# Patient Record
Sex: Female | Born: 1935 | Race: White | Hispanic: No | Smoking: Never smoker
Health system: Southern US, Community
[De-identification: ages and names within clinical notes are randomized; demographics above are authoritative.]

## PROBLEM LIST (undated history)

## (undated) DIAGNOSIS — F419 Anxiety disorder, unspecified: Secondary | ICD-10-CM

## (undated) DIAGNOSIS — G2581 Restless legs syndrome: Secondary | ICD-10-CM

## (undated) DIAGNOSIS — Z23 Encounter for immunization: Secondary | ICD-10-CM

## (undated) DIAGNOSIS — Z1211 Encounter for screening for malignant neoplasm of colon: Secondary | ICD-10-CM

## (undated) DIAGNOSIS — J189 Pneumonia, unspecified organism: Secondary | ICD-10-CM

## (undated) DIAGNOSIS — Z1331 Encounter for screening for depression: Secondary | ICD-10-CM

## (undated) DIAGNOSIS — Z79899 Other long term (current) drug therapy: Secondary | ICD-10-CM

## (undated) DIAGNOSIS — M25519 Pain in unspecified shoulder: Secondary | ICD-10-CM

## (undated) DIAGNOSIS — E871 Hypo-osmolality and hyponatremia: Secondary | ICD-10-CM

## (undated) DIAGNOSIS — N189 Chronic kidney disease, unspecified: Secondary | ICD-10-CM

## (undated) DIAGNOSIS — IMO0001 Reserved for inherently not codable concepts without codable children: Secondary | ICD-10-CM

## (undated) DIAGNOSIS — I219 Acute myocardial infarction, unspecified: Secondary | ICD-10-CM

## (undated) DIAGNOSIS — E1143 Type 2 diabetes mellitus with diabetic autonomic (poly)neuropathy: Secondary | ICD-10-CM

## (undated) DIAGNOSIS — H9190 Unspecified hearing loss, unspecified ear: Secondary | ICD-10-CM

## (undated) DIAGNOSIS — M25569 Pain in unspecified knee: Secondary | ICD-10-CM

## (undated) DIAGNOSIS — I4891 Unspecified atrial fibrillation: Secondary | ICD-10-CM

## (undated) DIAGNOSIS — F329 Major depressive disorder, single episode, unspecified: Secondary | ICD-10-CM

## (undated) DIAGNOSIS — Z1231 Encounter for screening mammogram for malignant neoplasm of breast: Secondary | ICD-10-CM

## (undated) DIAGNOSIS — K209 Esophagitis, unspecified without bleeding: Secondary | ICD-10-CM

## (undated) DIAGNOSIS — N390 Urinary tract infection, site not specified: Secondary | ICD-10-CM

## (undated) DIAGNOSIS — M810 Age-related osteoporosis without current pathological fracture: Secondary | ICD-10-CM

## (undated) DIAGNOSIS — L209 Atopic dermatitis, unspecified: Secondary | ICD-10-CM

## (undated) DIAGNOSIS — M531 Cervicobrachial syndrome: Secondary | ICD-10-CM

## (undated) DIAGNOSIS — E785 Hyperlipidemia, unspecified: Secondary | ICD-10-CM

## (undated) DIAGNOSIS — E119 Type 2 diabetes mellitus without complications: Secondary | ICD-10-CM

## (undated) DIAGNOSIS — F32A Depression, unspecified: Secondary | ICD-10-CM

## (undated) DIAGNOSIS — I10 Essential (primary) hypertension: Secondary | ICD-10-CM

## (undated) DIAGNOSIS — G909 Disorder of the autonomic nervous system, unspecified: Secondary | ICD-10-CM

## (undated) DIAGNOSIS — R519 Headache, unspecified: Secondary | ICD-10-CM

## (undated) DIAGNOSIS — K219 Gastro-esophageal reflux disease without esophagitis: Secondary | ICD-10-CM

## (undated) DIAGNOSIS — J309 Allergic rhinitis, unspecified: Secondary | ICD-10-CM

## (undated) DIAGNOSIS — J45909 Unspecified asthma, uncomplicated: Secondary | ICD-10-CM

## (undated) DIAGNOSIS — I8310 Varicose veins of unspecified lower extremity with inflammation: Secondary | ICD-10-CM

## (undated) DIAGNOSIS — M1990 Unspecified osteoarthritis, unspecified site: Secondary | ICD-10-CM

## (undated) DIAGNOSIS — R001 Bradycardia, unspecified: Secondary | ICD-10-CM

## (undated) DIAGNOSIS — E559 Vitamin D deficiency, unspecified: Secondary | ICD-10-CM

## (undated) DIAGNOSIS — R609 Edema, unspecified: Secondary | ICD-10-CM

## (undated) DIAGNOSIS — R51 Headache: Secondary | ICD-10-CM

## (undated) HISTORY — DX: Edema, unspecified: R60.9

## (undated) HISTORY — DX: Type 2 diabetes mellitus without complications: E11.9

## (undated) HISTORY — DX: Atopic dermatitis, unspecified: L20.9

## (undated) HISTORY — DX: Restless legs syndrome: G25.81

## (undated) HISTORY — DX: Encounter for screening for depression: Z13.31

## (undated) HISTORY — DX: Unspecified hearing loss, unspecified ear: H91.90

## (undated) HISTORY — DX: Esophagitis, unspecified: K20.9

## (undated) HISTORY — PX: ABDOMINAL HYSTERECTOMY: SHX81

## (undated) HISTORY — DX: Esophagitis, unspecified without bleeding: K20.90

## (undated) HISTORY — DX: Encounter for immunization: Z23

## (undated) HISTORY — DX: Urinary tract infection, site not specified: N39.0

## (undated) HISTORY — DX: Encounter for screening mammogram for malignant neoplasm of breast: Z12.31

## (undated) HISTORY — DX: Anxiety disorder, unspecified: F41.9

## (undated) HISTORY — DX: Unspecified atrial fibrillation: I48.91

## (undated) HISTORY — DX: Age-related osteoporosis without current pathological fracture: M81.0

## (undated) HISTORY — DX: Vitamin D deficiency, unspecified: E55.9

## (undated) HISTORY — DX: Hypo-osmolality and hyponatremia: E87.1

## (undated) HISTORY — DX: Disorder of the autonomic nervous system, unspecified: G90.9

## (undated) HISTORY — DX: Hyperlipidemia, unspecified: E78.5

## (undated) HISTORY — DX: Essential (primary) hypertension: I10

## (undated) HISTORY — DX: Cervicobrachial syndrome: M53.1

## (undated) HISTORY — DX: Allergic rhinitis, unspecified: J30.9

## (undated) HISTORY — DX: Hypercalcemia: E83.52

## (undated) HISTORY — DX: Headache, unspecified: R51.9

## (undated) HISTORY — DX: Unspecified asthma, uncomplicated: J45.909

## (undated) HISTORY — PX: TONSILLECTOMY: SUR1361

## (undated) HISTORY — PX: SHOULDER SURGERY: SHX246

## (undated) HISTORY — DX: Other long term (current) drug therapy: Z79.899

## (undated) HISTORY — PX: CHOLECYSTECTOMY: SHX55

## (undated) HISTORY — PX: APPENDECTOMY: SHX54

## (undated) HISTORY — DX: Pain in unspecified shoulder: M25.519

## (undated) HISTORY — DX: Type 2 diabetes mellitus with diabetic autonomic (poly)neuropathy: E11.43

## (undated) HISTORY — PX: OTHER SURGICAL HISTORY: SHX169

## (undated) HISTORY — DX: Varicose veins of unspecified lower extremity with inflammation: I83.10

## (undated) HISTORY — DX: Bradycardia, unspecified: R00.1

## (undated) HISTORY — DX: Encounter for screening for malignant neoplasm of colon: Z12.11

## (undated) HISTORY — DX: Pain in unspecified knee: M25.569

## (undated) HISTORY — DX: Headache: R51

## (undated) HISTORY — DX: Reserved for inherently not codable concepts without codable children: IMO0001

## (undated) HISTORY — DX: Disorders of magnesium metabolism, unspecified: E83.40

---

## 1995-04-04 DIAGNOSIS — I10 Essential (primary) hypertension: Secondary | ICD-10-CM

## 1995-04-04 DIAGNOSIS — E785 Hyperlipidemia, unspecified: Secondary | ICD-10-CM

## 1995-04-04 HISTORY — DX: Essential (primary) hypertension: I10

## 1995-04-04 HISTORY — DX: Hyperlipidemia, unspecified: E78.5

## 1998-02-01 ENCOUNTER — Ambulatory Visit (HOSPITAL_COMMUNITY): Admission: RE | Admit: 1998-02-01 | Discharge: 1998-02-01 | Payer: Self-pay

## 1998-02-04 ENCOUNTER — Ambulatory Visit (HOSPITAL_COMMUNITY): Admission: RE | Admit: 1998-02-04 | Discharge: 1998-02-04 | Payer: Self-pay

## 1999-02-09 ENCOUNTER — Ambulatory Visit (HOSPITAL_COMMUNITY): Admission: RE | Admit: 1999-02-09 | Discharge: 1999-02-09 | Payer: Self-pay | Admitting: Family Medicine

## 2000-02-16 ENCOUNTER — Ambulatory Visit (HOSPITAL_COMMUNITY): Admission: RE | Admit: 2000-02-16 | Discharge: 2000-02-16 | Payer: Self-pay | Admitting: Family Medicine

## 2000-02-16 ENCOUNTER — Encounter: Payer: Self-pay | Admitting: Family Medicine

## 2000-07-19 ENCOUNTER — Encounter: Payer: Self-pay | Admitting: Family Medicine

## 2000-07-19 ENCOUNTER — Encounter: Admission: RE | Admit: 2000-07-19 | Discharge: 2000-07-19 | Payer: Self-pay | Admitting: Family Medicine

## 2001-01-16 ENCOUNTER — Encounter: Admission: RE | Admit: 2001-01-16 | Discharge: 2001-01-16 | Payer: Self-pay | Admitting: Family Medicine

## 2001-01-16 ENCOUNTER — Encounter: Payer: Self-pay | Admitting: Family Medicine

## 2002-04-03 DIAGNOSIS — E119 Type 2 diabetes mellitus without complications: Secondary | ICD-10-CM

## 2002-04-03 HISTORY — DX: Type 2 diabetes mellitus without complications: E11.9

## 2013-05-19 ENCOUNTER — Encounter: Payer: Self-pay | Admitting: *Deleted

## 2013-05-21 ENCOUNTER — Institutional Professional Consult (permissible substitution): Payer: Self-pay | Admitting: Internal Medicine

## 2013-06-27 ENCOUNTER — Encounter: Payer: Self-pay | Admitting: Internal Medicine

## 2013-06-27 ENCOUNTER — Ambulatory Visit (INDEPENDENT_AMBULATORY_CARE_PROVIDER_SITE_OTHER): Payer: Medicare Other | Admitting: Internal Medicine

## 2013-06-27 VITALS — BP 198/75 | HR 71 | Wt 165.0 lb

## 2013-06-27 DIAGNOSIS — I4891 Unspecified atrial fibrillation: Secondary | ICD-10-CM

## 2013-06-27 DIAGNOSIS — I10 Essential (primary) hypertension: Secondary | ICD-10-CM

## 2013-06-27 DIAGNOSIS — I498 Other specified cardiac arrhythmias: Secondary | ICD-10-CM

## 2013-06-27 DIAGNOSIS — R001 Bradycardia, unspecified: Secondary | ICD-10-CM | POA: Insufficient documentation

## 2013-06-27 MED ORDER — LOSARTAN POTASSIUM-HCTZ 100-12.5 MG PO TABS: 1.0000 | ORAL_TABLET | Freq: Every day | ORAL | Status: DC

## 2013-06-27 MED ORDER — PROPRANOLOL HCL 10 MG PO TABS: 10.0000 mg | ORAL_TABLET | Freq: Four times a day (QID) | ORAL | Status: DC | PRN

## 2013-06-27 NOTE — Progress Notes (Signed)
Patient Care Team: Provider Default, MD as PCP - General   HPI  Kelly Boone is a 80 y.o. female Seen with recent diagnosis of atrial fibrillation for which she was started on Rivaroxaban coreg, losaran and Mag and aldactone   she had awakened for the second time in 2 years with tachycardia palpitations. The first episode had stopped shortly after arrival in the emergency room apparently.  The second, not having stopped prompted admission. She apparently underwent Myoview scanning and echocardiography which were unrevealing  reports were reviewed. Interestingly her left atrial dimension was normal.   She has hx of hypertenson, dabetes   she was negatively impressed by the cost of the anticoagulant and so she deferred initiating and 2 she came for this visit today.  She does not snore.  She has not had problems with exercise intolerance.  Her biggest limitation is seen in her legs.    Past Medical History  Diagnosis Date  . A-fib     irregular heartbeat  . DM (diabetes mellitus) 2004  . Hypertension 1997  . Hyperlipemia 1997  . Allergic rhinitis   . Anxiety   . Asthma   . Atopic dermatitis   . Benign essential hypertension   . Edema   . Special screening for malignant neoplasms, colon   . Esophagitis   . Headache   . Hearing loss   . Encounter for long-term (current) use of other medications   . Hypercalcemia   . Other and unspecified hyperlipidemia   . Disorders of magnesium metabolism   . Hyposmolality and/or hyponatremia   . Pain in joint, lower leg   . Myalgia and myositis, unspecified   . Need for prophylactic vaccination and inoculation against influenza   . Other syndromes affecting cervical region   . Osteoporosis, unspecified   . Pain in joint, multiple sites   . Pain in joint, shoulder region   . Palpitations   . Personal history of noncompliance with medical treatment, presenting hazards to health   . Restless legs syndrome (RLS)   . Unspecified  tinnitus   . Screening for depression   . Sleep related leg cramps   . Varicose veins of lower extremities with inflammation   . Unspecified essential hypertension   . Peripheral autonomic neuropathy in disorders classified elsewhere(337.1)   . Type 2 diabetes mellitus with autonomic neuropathy   . Urinary tract infection, site not specified   . Other screening mammogram   . Unspecified vitamin D deficiency     Past Surgical History  Procedure Laterality Date  . Appendectomy    . Cholecystectomy    . Abdominal hysterectomy    . Shoulder surgery      Repair  . Tonsillectomy    . Other surgical history      Nuclear Stress Test with EF 70%, Chest Radiograph-lungs are clear without pleural effusions or focal consolidations. Trachea projects midline and there is no pneumothorax. Suture anchors in right humeral head.    Current Outpatient Prescriptions  Medication Sig Dispense Refill  . alendronate (FOSAMAX) 70 MG tablet Take 70 mg by mouth once a week. Take with a full glass of water on an empty stomach.      Marland Kitchen aspirin 81 MG tablet Take 81 mg by mouth daily.      Marland Kitchen BIOTIN PO Take by mouth daily.      . Calcium Carbonate (CALCIUM 600 PO) Take 600 mg by mouth daily.      Marland Kitchen  carvedilol (COREG) 3.125 MG tablet Take 3.125 mg by mouth 2 (two) times daily with a meal.      . Cholecalciferol (VITAMIN D PO) Take 1,000 Units by mouth daily.      . diazepam (VALIUM) 5 MG tablet Take 5 mg by mouth every 6 (six) hours as needed for anxiety.      Marland Kitchen GRAPE SEED EXTRACT PO Take by mouth daily.      Marland Kitchen losartan (COZAAR) 100 MG tablet Take 100 mg by mouth daily.      . Magnesium 250 MG TABS 2 tabs po qd      . NON FORMULARY Isotonix  1 teaspoon daily for  RLS      . rOPINIRole (REQUIP) 1 MG tablet Take 1 mg by mouth at bedtime.      Marland Kitchen spironolactone (ALDACTONE) 25 MG tablet Take 25 mg by mouth daily.      . vitamin B-12 (CYANOCOBALAMIN) 1000 MCG tablet Take 1,000 mcg by mouth 2 (two) times daily.         No current facility-administered medications for this visit.    Allergies  Allergen Reactions  . Daypro [Oxaprozin]     Palpitation  . Statins     Myalgias  . Zetia [Ezetimibe]     Joint Pain, Swelling    Review of Systems negative except from HPI and PMH  Physical Exam BP 198/75  Pulse 71  Wt 165 lb (74.844 kg) Alert and oriented in no acute distress HENT- normal Eyes- EOMI, without scleral icterus Skin- warm and dry; without rashes LN-neg Neck- supple without thyromegaly, JVP-flat, carotids brisk and full without bruits Back-without CVAT or kyphosis Lungs-clear to auscultation CV-Regular rate and rhythm, nl S1 and S2, no murmurs gallops or rubs, S4-absent Abd-soft with active bowel sounds; no midline pulsation or hepatomegaly Pulses-intact femoral and distal MKS-without gross deformity Neuro- Ax O, CN3-12 intact, grossly normal motor and sensory function Affect engaging Ext2+ edema    Assessment and  Plan  Atrial fibrillation  Sinus bradycardia  Hypertension  Diabetes   Infrequent paroxysms of atrial fibrillation are associated with a rapid ventricular rate. She also has resting sinus bradycardia. Hence, we will have difficulty using chronic medications to mitigate the rapid rates with her paroxysms of atrial fibrillation. Thankfully however her episodes have occurred only once every couple of years and so we will use when necessary beta blockers to be taken at the time of an event.  Her CHADS-  score is 3 and her CHADS-VASc score is 5. Hence, anticoagulation is indicated. We discussed the use of the NOACs compared to Coumadin. We briefly reviewed the data of at least comparability in stroke prevention, bleeding and outcome. We discussed some of the new once wherein somewhat associated with decreased ischemic stroke risk, one to be taken daily, and has been shown to be comparable and bleeding risk to aspirin.  We also discussed bleeding associated with  warfarin as well as NOACs and a wall bleeding as a complication of all these drugs intracranial bleeding is more frequently associated with warfarin then the NOACs and a GI bleeding is more commonly associated with the latter  As relates to her diabetes, her hemoglobin A1c was 5.8. She is getting a great deal of money on her new hypoglycemic agent I suggested she talk to Dr. Marcello Moores about resuming metformin.  Given her peripheral edema, we will switch her from we'll switch from losartan--losartan/HCT 100/12.5

## 2013-06-27 NOTE — Patient Instructions (Addendum)
Your physician has recommended you make the following change in your medication:  1) Take Propanolol 10 mg every 6 hours as needed 2) Stop Losartan 3) Start Hyzaar 100-12.5 mg daily  Eliquis 2.5 mg samples given to you today   Your physician wants you to follow-up in: 6 months with Dr. Caryl Comes. You will receive a reminder letter in the mail two months in advance. If you don't receive a letter, please call our office to schedule the follow-up appointment.

## 2013-08-13 ENCOUNTER — Telehealth: Payer: Self-pay | Admitting: Internal Medicine

## 2013-08-13 ENCOUNTER — Other Ambulatory Visit: Payer: Self-pay | Admitting: *Deleted

## 2013-08-13 MED ORDER — APIXABAN 5 MG PO TABS
5.0000 mg | ORAL_TABLET | Freq: Two times a day (BID) | ORAL | Status: DC
Start: 2013-08-13 — End: 2013-09-28

## 2013-08-13 NOTE — Telephone Encounter (Signed)
New message    Patient was told to call back this am speak with Dr. Caryl Comes nurse.

## 2013-08-13 NOTE — Telephone Encounter (Signed)
Called patient to discuss which anticoagulant she is taking - she states Eliquis. Rx sent to [facility]

## 2013-08-21 ENCOUNTER — Other Ambulatory Visit: Payer: Self-pay | Admitting: *Deleted

## 2013-08-21 ENCOUNTER — Telehealth: Payer: Self-pay | Admitting: *Deleted

## 2013-08-21 NOTE — Telephone Encounter (Signed)
Pt calls in complaining of low BP, dizzy, swimmy headed. BP as low as 89/60, avg low 100s. Originally started Hyzaar because of peripheral edema - which has resolved. Discussed with Alferd Apa, pharmacist - stopping Hyzaar, restarting Losartan 100 mg daily. Advised pt to call us if edema returns. She is agreeable to plan.

## 2013-09-08 ENCOUNTER — Other Ambulatory Visit: Payer: Self-pay | Admitting: *Deleted

## 2013-09-08 MED ORDER — PROPRANOLOL HCL 10 MG PO TABS: 10.0000 mg | ORAL_TABLET | Freq: Four times a day (QID) | ORAL | Status: DC | PRN

## 2013-09-28 ENCOUNTER — Encounter (HOSPITAL_COMMUNITY): Payer: Self-pay | Admitting: Emergency Medicine

## 2013-09-28 ENCOUNTER — Emergency Department (HOSPITAL_COMMUNITY): Payer: Medicare Other

## 2013-09-28 ENCOUNTER — Inpatient Hospital Stay (HOSPITAL_COMMUNITY)
Admission: EM | Admit: 2013-09-28 | Discharge: 2013-10-01 | DRG: 281 | Disposition: A | Discharge status: Home / Self Care | Payer: Medicare Other | Attending: Cardiovascular Disease | Admitting: Cardiovascular Disease

## 2013-09-28 DIAGNOSIS — I498 Other specified cardiac arrhythmias: Secondary | ICD-10-CM | POA: Diagnosis present

## 2013-09-28 DIAGNOSIS — I5181 Takotsubo syndrome: Principal | ICD-10-CM | POA: Diagnosis present

## 2013-09-28 DIAGNOSIS — E785 Hyperlipidemia, unspecified: Secondary | ICD-10-CM | POA: Diagnosis present

## 2013-09-28 DIAGNOSIS — I482 Chronic atrial fibrillation, unspecified: Secondary | ICD-10-CM

## 2013-09-28 DIAGNOSIS — M810 Age-related osteoporosis without current pathological fracture: Secondary | ICD-10-CM | POA: Diagnosis present

## 2013-09-28 DIAGNOSIS — E871 Hypo-osmolality and hyponatremia: Secondary | ICD-10-CM | POA: Diagnosis present

## 2013-09-28 DIAGNOSIS — G2581 Restless legs syndrome: Secondary | ICD-10-CM | POA: Diagnosis present

## 2013-09-28 DIAGNOSIS — Z7901 Long term (current) use of anticoagulants: Secondary | ICD-10-CM

## 2013-09-28 DIAGNOSIS — I480 Paroxysmal atrial fibrillation: Secondary | ICD-10-CM

## 2013-09-28 DIAGNOSIS — G909 Disorder of the autonomic nervous system, unspecified: Secondary | ICD-10-CM | POA: Diagnosis present

## 2013-09-28 DIAGNOSIS — F411 Generalized anxiety disorder: Secondary | ICD-10-CM | POA: Diagnosis present

## 2013-09-28 DIAGNOSIS — H9190 Unspecified hearing loss, unspecified ear: Secondary | ICD-10-CM | POA: Diagnosis present

## 2013-09-28 DIAGNOSIS — E1149 Type 2 diabetes mellitus with other diabetic neurological complication: Secondary | ICD-10-CM | POA: Diagnosis present

## 2013-09-28 DIAGNOSIS — Z79899 Other long term (current) drug therapy: Secondary | ICD-10-CM

## 2013-09-28 DIAGNOSIS — I4891 Unspecified atrial fibrillation: Secondary | ICD-10-CM | POA: Diagnosis present

## 2013-09-28 DIAGNOSIS — I214 Non-ST elevation (NSTEMI) myocardial infarction: Secondary | ICD-10-CM | POA: Diagnosis present

## 2013-09-28 DIAGNOSIS — J45909 Unspecified asthma, uncomplicated: Secondary | ICD-10-CM | POA: Diagnosis present

## 2013-09-28 DIAGNOSIS — I059 Rheumatic mitral valve disease, unspecified: Secondary | ICD-10-CM

## 2013-09-28 DIAGNOSIS — R001 Bradycardia, unspecified: Secondary | ICD-10-CM | POA: Diagnosis present

## 2013-09-28 DIAGNOSIS — I2510 Atherosclerotic heart disease of native coronary artery without angina pectoris: Secondary | ICD-10-CM | POA: Diagnosis present

## 2013-09-28 DIAGNOSIS — I10 Essential (primary) hypertension: Secondary | ICD-10-CM | POA: Diagnosis present

## 2013-09-28 DIAGNOSIS — Z7982 Long term (current) use of aspirin: Secondary | ICD-10-CM

## 2013-09-28 LAB — CBC WITH DIFFERENTIAL/PLATELET
Basophils Absolute: 0 10*3/uL (ref 0.0–0.1)
Basophils Relative: 0 % (ref 0–1)
Eosinophils Absolute: 0.1 10*3/uL (ref 0.0–0.7)
Eosinophils Relative: 2 % (ref 0–5)
HCT: 36.8 % (ref 36.0–46.0)
Hemoglobin: 13 g/dL (ref 12.0–15.0)
Lymphocytes Relative: 27 % (ref 12–46)
Lymphs Abs: 2.1 10*3/uL (ref 0.7–4.0)
MCH: 31.1 pg (ref 26.0–34.0)
MCHC: 35.3 g/dL (ref 30.0–36.0)
MCV: 88 fL (ref 78.0–100.0)
Monocytes Absolute: 0.8 10*3/uL (ref 0.1–1.0)
Monocytes Relative: 10 % (ref 3–12)
Neutro Abs: 4.7 10*3/uL (ref 1.7–7.7)
Neutrophils Relative %: 61 % (ref 43–77)
Platelets: 314 10*3/uL (ref 150–400)
RBC: 4.18 MIL/uL (ref 3.87–5.11)
RDW: 12.4 % (ref 11.5–15.5)
WBC: 7.7 10*3/uL (ref 4.0–10.5)

## 2013-09-28 LAB — GLUCOSE, CAPILLARY
Glucose-Capillary: 90 mg/dL (ref 70–99)
Glucose-Capillary: 112 mg/dL — ABNORMAL HIGH (ref 70–99)

## 2013-09-28 LAB — COMPREHENSIVE METABOLIC PANEL
ALT: 16 U/L (ref 0–35)
AST: 24 U/L (ref 0–37)
Albumin: 4.1 g/dL (ref 3.5–5.2)
Alkaline Phosphatase: 93 U/L (ref 39–117)
BUN: 19 mg/dL (ref 6–23)
CO2: 23 mEq/L (ref 19–32)
Calcium: 11.3 mg/dL — ABNORMAL HIGH (ref 8.4–10.5)
Chloride: 91 mEq/L — ABNORMAL LOW (ref 96–112)
Creatinine, Ser: 0.92 mg/dL (ref 0.50–1.10)
GFR calc Af Amer: 67 mL/min — ABNORMAL LOW (ref >90)
GFR calc non Af Amer: 58 mL/min — ABNORMAL LOW (ref >90)
Glucose, Bld: 111 mg/dL — ABNORMAL HIGH (ref 70–99)
Potassium: 4.5 mEq/L (ref 3.7–5.3)
Sodium: 128 mEq/L — ABNORMAL LOW (ref 137–147)
Total Bilirubin: 0.6 mg/dL (ref 0.3–1.2)
Total Protein: 7.5 g/dL (ref 6.0–8.3)

## 2013-09-28 LAB — URINALYSIS, ROUTINE W REFLEX MICROSCOPIC
Bilirubin Urine: NEGATIVE
Glucose, UA: NEGATIVE mg/dL
Hgb urine dipstick: NEGATIVE
Ketones, ur: NEGATIVE mg/dL
Nitrite: POSITIVE — AB
Protein, ur: NEGATIVE mg/dL
Specific Gravity, Urine: 1.013 (ref 1.005–1.030)
Urobilinogen, UA: 0.2 mg/dL (ref 0.0–1.0)
pH: 7 (ref 5.0–8.0)

## 2013-09-28 LAB — PHOSPHORUS: Phosphorus: 2.2 mg/dL — ABNORMAL LOW (ref 2.3–4.6)

## 2013-09-28 LAB — APTT: aPTT: 32 seconds (ref 24–37)

## 2013-09-28 LAB — TROPONIN I
Troponin I: 2.5 ng/mL (ref <0.30)
Troponin I: 9.54 ng/mL (ref <0.30)
Troponin I: 5.67 ng/mL (ref <0.30)

## 2013-09-28 LAB — I-STAT TROPONIN, ED: Troponin i, poc: 1.41 ng/mL (ref 0.00–0.08)

## 2013-09-28 LAB — PROTIME-INR
INR: 1.07 (ref 0.00–1.49)
Prothrombin Time: 13.9 seconds (ref 11.6–15.2)

## 2013-09-28 LAB — URINE MICROSCOPIC-ADD ON

## 2013-09-28 LAB — HEPARIN LEVEL (UNFRACTIONATED): Heparin Unfractionated: 1.02 IU/mL — ABNORMAL HIGH (ref 0.30–0.70)

## 2013-09-28 LAB — MRSA PCR SCREENING: MRSA by PCR: NEGATIVE

## 2013-09-28 LAB — MAGNESIUM: Magnesium: 2.2 mg/dL (ref 1.5–2.5)

## 2013-09-28 LAB — TSH: TSH: 3.05 u[IU]/mL (ref 0.350–4.500)

## 2013-09-28 MED ORDER — ONDANSETRON HCL 4 MG/2ML IJ SOLN
4.0000 mg | Freq: Four times a day (QID) | INTRAMUSCULAR | Status: DC | PRN
  Administered 2013-09-30: 4 mg via INTRAVENOUS
  Filled 2013-09-28: qty 2

## 2013-09-28 MED ORDER — INSULIN ASPART 100 UNIT/ML ~~LOC~~ SOLN
0.0000 [IU] | Freq: Three times a day (TID) | SUBCUTANEOUS | Status: DC
  Administered 2013-09-29 – 2013-10-01 (×2): 2 [IU] via SUBCUTANEOUS

## 2013-09-28 MED ORDER — HYDROCHLOROTHIAZIDE 12.5 MG PO CAPS
12.5000 mg | ORAL_CAPSULE | Freq: Every day | ORAL | Status: DC
  Administered 2013-09-29 – 2013-10-01 (×3): 12.5 mg via ORAL
  Filled 2013-09-28 (×3): qty 1

## 2013-09-28 MED ORDER — LOSARTAN POTASSIUM 50 MG PO TABS
100.0000 mg | ORAL_TABLET | Freq: Every day | ORAL | Status: DC
  Administered 2013-09-29 – 2013-10-01 (×3): 100 mg via ORAL
  Filled 2013-09-28 (×3): qty 2

## 2013-09-28 MED ORDER — NITROGLYCERIN 0.4 MG SL SUBL
0.4000 mg | SUBLINGUAL_TABLET | SUBLINGUAL | Status: DC | PRN
  Administered 2013-09-28 (×2): 0.4 mg via SUBLINGUAL
  Filled 2013-09-28: qty 1

## 2013-09-28 MED ORDER — ROPINIROLE HCL 1 MG PO TABS
1.0000 mg | ORAL_TABLET | Freq: Every day | ORAL | Status: DC
  Filled 2013-09-28: qty 1

## 2013-09-28 MED ORDER — LOSARTAN POTASSIUM-HCTZ 100-12.5 MG PO TABS: 1.0000 | ORAL_TABLET | Freq: Every day | ORAL | Status: DC

## 2013-09-28 MED ORDER — ACETAMINOPHEN 325 MG PO TABS
650.0000 mg | ORAL_TABLET | ORAL | Status: DC | PRN
  Administered 2013-09-28 – 2013-09-29 (×3): 650 mg via ORAL
  Filled 2013-09-28 (×3): qty 2

## 2013-09-28 MED ORDER — SODIUM CHLORIDE 0.9 % IV SOLN
INTRAVENOUS | Status: DC
  Administered 2013-09-28: 14:00:00 via INTRAVENOUS

## 2013-09-28 MED ORDER — ALBUTEROL SULFATE (2.5 MG/3ML) 0.083% IN NEBU: 3.0000 mL | INHALATION_SOLUTION | Freq: Four times a day (QID) | RESPIRATORY_TRACT | Status: DC | PRN

## 2013-09-28 MED ORDER — HEPARIN (PORCINE) IN NACL 100-0.45 UNIT/ML-% IJ SOLN
800.0000 [IU]/h | INTRAMUSCULAR | Status: DC
  Administered 2013-09-28: 900 [IU]/h via INTRAVENOUS
  Administered 2013-09-29: 800 [IU]/h via INTRAVENOUS
  Filled 2013-09-28 (×4): qty 250

## 2013-09-28 MED ORDER — ROPINIROLE HCL 1 MG PO TABS
2.0000 mg | ORAL_TABLET | Freq: Every day | ORAL | Status: DC
  Administered 2013-09-28 – 2013-09-30 (×3): 2 mg via ORAL
  Filled 2013-09-28 (×5): qty 2

## 2013-09-28 MED ORDER — CARVEDILOL 3.125 MG PO TABS
3.1250 mg | ORAL_TABLET | Freq: Two times a day (BID) | ORAL | Status: DC
  Administered 2013-09-28 – 2013-10-01 (×6): 3.125 mg via ORAL
  Filled 2013-09-28 (×8): qty 1

## 2013-09-28 MED ORDER — DIAZEPAM 5 MG PO TABS
5.0000 mg | ORAL_TABLET | Freq: Four times a day (QID) | ORAL | Status: DC | PRN
  Administered 2013-09-28 – 2013-09-29 (×2): 5 mg via ORAL
  Filled 2013-09-28 (×2): qty 1

## 2013-09-28 MED ORDER — ASPIRIN 81 MG PO CHEW: 324.0000 mg | CHEWABLE_TABLET | Freq: Once | ORAL | Status: DC

## 2013-09-28 MED ORDER — LINAGLIPTIN 5 MG PO TABS
2.5000 mg | ORAL_TABLET | Freq: Every day | ORAL | Status: DC
  Administered 2013-09-28 – 2013-10-01 (×3): 2.5 mg via ORAL
  Filled 2013-09-28 (×4): qty 1

## 2013-09-28 MED ORDER — SODIUM CHLORIDE 0.9 % IJ SOLN
3.0000 mL | Freq: Two times a day (BID) | INTRAMUSCULAR | Status: DC
  Administered 2013-09-30 (×2): 3 mL via INTRAVENOUS

## 2013-09-28 MED ORDER — SODIUM CHLORIDE 0.9 % IV SOLN: 250.0000 mL | INTRAVENOUS | Status: DC | PRN

## 2013-09-28 MED ORDER — SODIUM CHLORIDE 0.9 % IJ SOLN: 3.0000 mL | INTRAMUSCULAR | Status: DC | PRN

## 2013-09-28 MED ORDER — ASPIRIN EC 81 MG PO TBEC
81.0000 mg | DELAYED_RELEASE_TABLET | Freq: Once | ORAL | Status: AC
  Administered 2013-09-28: 81 mg via ORAL
  Filled 2013-09-28: qty 1

## 2013-09-28 NOTE — ED Notes (Signed)
Pt in from home via [facility], pt reports L sided radiating CP into L neck & L arm today while standing at [place], pt drove herself home & upon arrival home called EMS, pt denies SOB, n/v/d with episode, hx of a fib, pt rcvd 324 mg ASA pta, A&O x4

## 2013-09-28 NOTE — Progress Notes (Addendum)
ANTICOAGULATION CONSULT NOTE - Initial Consult  Pharmacy Consult for Heparin Indication: chest pain/ACS, Afib  Allergies  Allergen Reactions  . Daypro [Oxaprozin]     Palpitation  . Statins     Myalgias  . Zetia [Ezetimibe]     Joint Pain, Swelling    Patient Measurements: Height: 5\' 2"  (157.5 cm) Weight: 158 lb 11.7 oz (72 kg) IBW/kg (Calculated) : 50.1 Heparin Dosing Weight: 65.4kg  Vital Signs: Temp: 97.5 F (36.4 C) (06/28 1700) Temp src: Oral (06/28 1700) BP: 175/67 mmHg (06/28 1700) Pulse Rate: 113 (06/28 1700)  Labs:  Recent Labs  09/28/13 1326 09/28/13 1428  HGB 13.0  --   HCT 36.8  --   PLT 314  --   APTT  --  32  LABPROT  --  13.9  INR  --  1.07  CREATININE 0.92  --   TROPONINI 2.50*  --     Estimated Creatinine Clearance: 46.1 ml/min (by C-G formula based on Cr of 0.92).   Medical History: Past Medical History  Diagnosis Date  . A-fib     irregular heartbeat  . DM (diabetes mellitus) 2004  . Hypertension 1997  . Hyperlipemia 1997  . Allergic rhinitis   . Anxiety   . Asthma   . Atopic dermatitis   . Benign essential hypertension   . Edema   . Special screening for malignant neoplasms, colon   . Esophagitis   . Headache   . Hearing loss   . Encounter for long-term (current) use of other medications   . Hypercalcemia   . Sinus bradycardia   . Disorders of magnesium metabolism   . Hyposmolality and/or hyponatremia   . Pain in joint, lower leg   . Myalgia and myositis, unspecified   . Need for prophylactic vaccination and inoculation against influenza   . Other syndromes affecting cervical region   . Osteoporosis, unspecified   . Pain in joint, shoulder region   . Restless legs syndrome (RLS)   . Screening for depression   . Varicose veins of lower extremities with inflammation   . Unspecified essential hypertension   . Peripheral autonomic neuropathy in disorders classified elsewhere(337.1)   . Type 2 diabetes mellitus with  autonomic neuropathy   . Urinary tract infection, site not specified   . Other screening mammogram   . Unspecified vitamin D deficiency     Medications:  Prescriptions prior to admission  Medication Sig Dispense Refill  . albuterol (PROVENTIL HFA;VENTOLIN HFA) 108 (90 BASE) MCG/ACT inhaler Inhale 2 puffs into the lungs every 6 (six) hours as needed for wheezing or shortness of breath.      Marland Kitchen apixaban (ELIQUIS) 5 MG TABS tablet Take 5 mg by mouth daily.      Marland Kitchen aspirin 81 MG tablet Take 81 mg by mouth once.      Marland Kitchen BIOTIN PO Take 1,000 mg by mouth daily.       . carvedilol (COREG) 3.125 MG tablet Take 3.125 mg by mouth 2 (two) times daily with a meal.      . Cholecalciferol (VITAMIN D PO) Take 1,000 Units by mouth daily.      . diazepam (VALIUM) 5 MG tablet Take 5 mg by mouth every 6 (six) hours as needed for anxiety.      Marland Kitchen GRAPE SEED EXTRACT PO Take 50 mg by mouth daily.       Marland Kitchen linagliptin (TRADJENTA) 5 MG TABS tablet Take 2.5 mg by mouth daily.       Marland Kitchen  losartan-hydrochlorothiazide (HYZAAR) 100-12.5 MG per tablet Take 1 tablet by mouth daily.  30 tablet  6  . Magnesium 250 MG TABS 2 tabs po qd      . propranolol (INDERAL) 10 MG tablet Take 10 mg by mouth every 6 (six) hours as needed (racing heart).      Marland Kitchen rOPINIRole (REQUIP) 1 MG tablet Take 1 mg by mouth at bedtime.      . vitamin B-12 (CYANOCOBALAMIN) 1000 MCG tablet Take 500 mcg by mouth 2 (two) times daily.         Assessment: 81yof on Eliquis PTA for Afib now on hold and starting heparin for CP/ACS while awaiting cath. Patient reports taking last dose of Eliquis 6/28 @ 0700 - will start heparin drip at time of next Eliquis dose (~1900 tonight). Eliquis can falsely affect heparin levels - will check baseline heparin level and most likely use aPTT for dosing initially.  - H/H and Plts wnl - Baseline INR 1.07, aPTT 32 - No significant bleeding reported - Heparin wt: 65.4kg  **Please note - patient reports only taking Eliquis 5mg   once daily rather than twice daily. Please discuss with patient.  Goal of Therapy:  Heparin level 0.3-0.7 units/ml Monitor platelets by anticoagulation protocol: Yes   Plan:  1. Check baseline heparin level 2. Start Heparin drip 900 units/hr (9 ml/hr) @ 1900 - no bolus 3. Check aPTT and heparin level 8 hours after initiation 4. Daily heparin level, aPTT and CBC  Earleen Newport [phone] 09/28/2013,5:40 PM

## 2013-09-28 NOTE — ED Notes (Signed)
NOTIFIED L. PARKER ,PA IN PERSON FOR PATIENTS PANIC LAB RESULTS OF I-STAT TROPONIN = 1.41ng/ml, @ 13:55 pm ,09/28/2013.

## 2013-09-28 NOTE — H&P (Signed)
Primary Cardiology: Dr Caryl Comes Primary: Dr Abbott Pao, [place]   HPI:  Kelly Boone is a 80 y.o. female with a h/o DM, HTN, PAF who established care with Dr. Caryl Comes on 06/27/2013.  At that time, she recently developed recurrent episode of paroxysmal atrial fibrillation.  He reviewed her medical records and a nuclear stress test that revealed an ejection fraction of 70% with normal perfusion which was done at  [facility] in January 2015.  An echo Doppler study showed normal systolic function without wall motion abnormalities.  Due to her child's score of 3 in her chest Vascor 5.  Anticoagulation was started.  The patient has been on eliquis since that time.  She also has been on losartan HCT.  There was some initial confusion, and Dr. Olin Pia initial note he at states that she was started on Neurontin, but subsequent conversation indicates bellicose.  The patient, however, states she has only been taking it once a day.  She denies any exertional history of chest pain.  Recently, she's been under significant increased stress with her sister, who is dying and her husband recently had her stroke.  Her brother is also becoming ill.  She has become very emotional concerning these health issues.  Today, while at church she experienced new onset chest discomfort, which radiated to her jaw.  The pain lasted for a total of an hour and ultimately subsided.  She presented to the emergency room.  She was pain-free.  Her ECG did show mild ST elevation inferolaterally.  Dr. Irish Lack was contacted as the "STEMI "  physician and due to concerns of possible pericarditis-like changes a stat echo was ordered since the patient was entirely pain-free.  The stat echo Doppler in the emergency room reveals a Takusubo cardiomyopathy pattern.  She is entirely pain-free.  She is now admitted for evaluation and treatment.  Past Medical History  Diagnosis Date  . A-fib     irregular heartbeat  . DM (diabetes mellitus) 2004  .  Hypertension 1997  . Hyperlipemia 1997  . Allergic rhinitis   . Anxiety   . Asthma   . Atopic dermatitis   . Benign essential hypertension   . Edema   . Special screening for malignant neoplasms, colon   . Esophagitis   . Headache   . Hearing loss   . Encounter for long-term (current) use of other medications   . Hypercalcemia   . Sinus bradycardia   . Disorders of magnesium metabolism   . Hyposmolality and/or hyponatremia   . Pain in joint, lower leg   . Myalgia and myositis, unspecified   . Need for prophylactic vaccination and inoculation against influenza   . Other syndromes affecting cervical region   . Osteoporosis, unspecified   . Pain in joint, shoulder region   . Restless legs syndrome (RLS)   . Screening for depression   . Varicose veins of lower extremities with inflammation   . Unspecified essential hypertension   . Peripheral autonomic neuropathy in disorders classified elsewhere(337.1)   . Type 2 diabetes mellitus with autonomic neuropathy   . Urinary tract infection, site not specified   . Other screening mammogram   . Unspecified vitamin D deficiency     Past Surgical History  Procedure Laterality Date  . Appendectomy    . Cholecystectomy    . Abdominal hysterectomy    . Shoulder surgery      Repair  . Tonsillectomy    . Other surgical history  Nuclear Stress Test with EF 70%, Chest Radiograph-lungs are clear without pleural effusions or focal consolidations. Trachea projects midline and there is no pneumothorax. Suture anchors in right humeral head.    Allergies  Allergen Reactions  . Daypro [Oxaprozin]     Palpitation  . Statins     Myalgias  . Zetia [Ezetimibe]     Joint Pain, Swelling    Current Facility-Administered Medications  Medication Dose Route Frequency Provider Last Rate Last Dose  . 0.9 %  sodium chloride infusion   Intravenous Continuous Ezequiel Essex, MD      . aspirin chewable tablet 324 mg  324 mg Oral Once Ezequiel Essex, MD       Current Outpatient Prescriptions  Medication Sig Dispense Refill  . albuterol (PROVENTIL HFA;VENTOLIN HFA) 108 (90 BASE) MCG/ACT inhaler Inhale 2 puffs into the lungs every 6 (six) hours as needed for wheezing or shortness of breath.      Marland Kitchen apixaban (ELIQUIS) 5 MG TABS tablet Take 5 mg by mouth daily.      Marland Kitchen aspirin 81 MG tablet Take 81 mg by mouth once.      Marland Kitchen BIOTIN PO Take 1,000 mg by mouth daily.       . carvedilol (COREG) 3.125 MG tablet Take 3.125 mg by mouth 2 (two) times daily with a meal.      . Cholecalciferol (VITAMIN D PO) Take 1,000 Units by mouth daily.      . diazepam (VALIUM) 5 MG tablet Take 5 mg by mouth every 6 (six) hours as needed for anxiety.      Marland Kitchen GRAPE SEED EXTRACT PO Take 50 mg by mouth daily.       Marland Kitchen linagliptin (TRADJENTA) 5 MG TABS tablet Take 2.5 mg by mouth daily.       Marland Kitchen losartan-hydrochlorothiazide (HYZAAR) 100-12.5 MG per tablet Take 1 tablet by mouth daily.  30 tablet  6  . Magnesium 250 MG TABS 2 tabs po qd      . propranolol (INDERAL) 10 MG tablet Take 10 mg by mouth every 6 (six) hours as needed (racing heart).      Marland Kitchen rOPINIRole (REQUIP) 1 MG tablet Take 1 mg by mouth at bedtime.      . vitamin B-12 (CYANOCOBALAMIN) 1000 MCG tablet Take 500 mcg by mouth 2 (two) times daily.         Socially, she is married.  There is no tobacco or alcohol use  Family History  Problem Relation Age of Onset  . Anemia Mother   . CAD Mother   . Hypertension Mother   . CAD Brother     ROS General: Negative; No fevers, chills, or night sweats HEENT: Negative; No changes in vision or hearing, sinus congestion, difficulty swallowing Pulmonary: Negative; No cough, wheezing, shortness of breath, hemoptysis Cardiovascular:  See HPI;  GI: Negative; No nausea, vomiting, diarrhea, or abdominal pain GU: Negative; No dysuria, hematuria, or difficulty voiding Musculoskeletal:  Myalgias on statins, positive for osteoporosis no  joint pain, or  weakness Hematologic/Oncologic: Negative; no easy bruising, bleeding Endocrine: Positive for type 2 diabetes mellitus with autonomic neuropathy; no heat/cold intolerance;  Neuro: Negative; no changes in balance, headaches Skin: Negative; No rashes or skin lesions Psychiatric: Positive for significant increased anxiety with her sister, who is near dying, husband's recent stroke, and her brother's illness  Sleep: Negative; No daytime sleepiness, hypersomnolence, bruxism, restless legs, hypnogagnic hallucinations Other comprehensive 14 point system review is negative   Physical Exam  BP 141/71  Pulse 71  Temp(Src) 97.7 F (36.5 C) (Oral)  Resp 15  Ht 5\' 2"  (1.575 m)  Wt 164 lb (74.39 kg)  BMI 29.99 kg/m2  SpO2 100% General: Alert, oriented, no distress.  Skin: normal turgor, no rashes, warm and dry HEENT: Normocephalic, atraumatic. Pupils equal round and reactive to light; sclera anicteric; extraocular muscles intact; Fundi no hemorrhages or exudate Nose without nasal septal hypertrophy Mouth/Parynx benign; Mallinpatti scale 3 Neck: No JVD, no carotid bruits; normal carotid upstroke Lungs: clear to ausculatation and percussion; no wheezing or rales Chest wall: without tenderness to palpitation Heart: PMI not displaced, RRR, s1 s2 normal, 1/6 systolic murmur, no diastolic murmur, no rubs, gallops, thrills, or heaves Abdomen: soft, nontender; no hepatosplenomehaly, BS+; abdominal aorta nontender and not dilated by palpation. Back: no CVA tenderness Pulses 2+ Musculoskeletal: full range of motion, normal strength, no joint deformities Extremities: no clubbing cyanosis or edema, Homan's sign negative  Neurologic: grossly nonfocal; Cranial nerves grossly wnl Psychologic: She became tearful when discussing her sister, who is near dying in a palliative care   ECG (independently read by me): Normal sinus rhythm with 1 mm ST elevation inferolaterally.  Normal QTc interval.  No evidence for  hyperacute T waves precordially; poor anterior R-wave progression  LABS:  BMET    Component Value Date/Time   NA 128* 09/28/2013 1326   K 4.5 09/28/2013 1326   CL 91* 09/28/2013 1326   CO2 23 09/28/2013 1326   GLUCOSE 111* 09/28/2013 1326   BUN 19 09/28/2013 1326   CREATININE 0.92 09/28/2013 1326   CALCIUM 11.3* 09/28/2013 1326   GFRNONAA 58* 09/28/2013 1326   GFRAA 67* 09/28/2013 1326     Hepatic Function Panel     Component Value Date/Time   PROT 7.5 09/28/2013 1326   ALBUMIN 4.1 09/28/2013 1326   AST 24 09/28/2013 1326   ALT 16 09/28/2013 1326   ALKPHOS 93 09/28/2013 1326   BILITOT 0.6 09/28/2013 1326     CBC    Component Value Date/Time   WBC 7.7 09/28/2013 1326   RBC 4.18 09/28/2013 1326   HGB 13.0 09/28/2013 1326   HCT 36.8 09/28/2013 1326   PLT 314 09/28/2013 1326   MCV 88.0 09/28/2013 1326   MCH 31.1 09/28/2013 1326   MCHC 35.3 09/28/2013 1326   RDW 12.4 09/28/2013 1326   LYMPHSABS 2.1 09/28/2013 1326   MONOABS 0.8 09/28/2013 1326   EOSABS 0.1 09/28/2013 1326   BASOSABS 0.0 09/28/2013 1326     BNP No results found for this basename: probnp    Lipid Panel  No results found for this basename: chol, trig, hdl, cholhdl, vldl, ldlcalc    Troponin (Point of Care Test)  Recent Labs  09/28/13 1350  TROPIPOC 1.41*    RADIOLOGY: Dg Chest 2 View  09/28/2013   CLINICAL DATA:  Chest pain  EXAM: CHEST  2 VIEW  COMPARISON:  04/28/2013  FINDINGS: Normal heart size. Lungs are under aerated with bibasilar atelectasis. No pleural effusion or pneumothorax.  IMPRESSION: Bibasilar atelectasis.   Electronically Signed   By: Maryclare Bean M.D.   On: 09/28/2013 14:07   Stat Echo Study Conclusions  - Left ventricle: The cavity size was normal. Systolic function was moderately to severely reduced. The estimated ejection fraction was in the range of 30% to 35%. There was severe hypokinesis to akinesis involving the mid distal septal wall, apex which extends to the mid inferolateral wall with  apical ballooning suggestive of Takusubo  cardiomyopathy. Doppler parameters are consistent with abnormal left ventricular relaxation (grade 1 diastolic dysfunction). - Mitral valve: Mildly calcified annulus. There was mild regurgitation. - Tricuspid valve: There was mild regurgitation   ASSESSMENT AND PLAN:  #1 Probable Takusubo  Cardiomyopathy contributed by recent increased stress due to severe illnesses among her sister, husband, and brother. #2 paroxysmal atrial fibrillation. #3 chronic anticoagulation with Ellik was  #4 hypercalcemia. #5 type 2 diabetes mellitus with autonomic neuropathy. #6 mild hyponatremia. #7 osteoporosis. #8 hyperlipidemia  Ms. Cuca Benassi is a 80 year old female who has a history of paroxysmal atrial fibrillation.  She was seen by Dr. Caryl Comes approximately 3 months ago and was started on anticoagulation.  She states she has been on eliquis 5 mg, but believes that she may only be taking this once rather than twice a day.  However, she took her last dose  this morning.  While at church, she developed new onset chest discomfort, which radiated to her jaw and neck.  This ultimately subsided after less than one hour.  Her ECG in the emergency room suggests mild inferolateral ST elevation.  Initial troponins are positive. A stat echo Doppler study suggests a Takusubo cardiomyopathy pattern with significant apical ballooning and only a minimal pericardial effusion.  She had vigorous contractility involving the basal walls of her septum and inferior posterior region, but has akinesis to almost dyskinesis towards the apex.  A recent nuclear perfusion study and echo Doppler study in January 2015 were normal with excellent LV function without wall motion abnormalities. Her anticoagulation will be held.  Ultimately, she will require cardiac catheterization in approximately 48 hours and we will tentatively plan this for Tuesday.  Will continue with ARB therapy, as well as beta  blocker therapy with carvedilol.  We will recheck calcium and phosphorus in light of her hypercalcemia and lipid studies will be reassessed.  Troy Sine, MD, [facility] 09/28/2013 3:50 PM

## 2013-09-28 NOTE — ED Provider Notes (Signed)
CSN: [id number]     Arrival date & time 09/28/13  1308 History   First MD Initiated Contact with Patient 09/28/13 1316     Chief Complaint  Patient presents with  . Chest Pain     (Consider location/radiation/quality/duration/timing/severity/associated sxs/prior Treatment) HPI Comments: Patient presents by EMS with episode of chest pain onset while she was at church. She developed left-sided chest pressure and pain radiating to her neck and arm that lasted about an hour. She drove herself to her house and called EMS. Pain resolved after receiving aspirin. She denies any shortness of breath, nausea, vomiting, diaphoresis. She reports no previous stents. She has a history of atrial fibrillation on eliquis and diabetes.   The history is provided by the patient and the EMS personnel.    Past Medical History  Diagnosis Date  . A-fib     irregular heartbeat  . DM (diabetes mellitus) 2004  . Hypertension 1997  . Hyperlipemia 1997  . Allergic rhinitis   . Anxiety   . Asthma   . Atopic dermatitis   . Benign essential hypertension   . Edema   . Special screening for malignant neoplasms, colon   . Esophagitis   . Headache   . Hearing loss   . Encounter for long-term (current) use of other medications   . Hypercalcemia   . Sinus bradycardia   . Disorders of magnesium metabolism   . Hyposmolality and/or hyponatremia   . Pain in joint, lower leg   . Myalgia and myositis, unspecified   . Need for prophylactic vaccination and inoculation against influenza   . Other syndromes affecting cervical region   . Osteoporosis, unspecified   . Pain in joint, shoulder region   . Restless legs syndrome (RLS)   . Screening for depression   . Varicose veins of lower extremities with inflammation   . Unspecified essential hypertension   . Peripheral autonomic neuropathy in disorders classified elsewhere(337.1)   . Type 2 diabetes mellitus with autonomic neuropathy   . Urinary tract infection, site  not specified   . Other screening mammogram   . Unspecified vitamin D deficiency    Past Surgical History  Procedure Laterality Date  . Appendectomy    . Cholecystectomy    . Abdominal hysterectomy    . Shoulder surgery      Repair  . Tonsillectomy    . Other surgical history      Nuclear Stress Test with EF 70%, Chest Radiograph-lungs are clear without pleural effusions or focal consolidations. Trachea projects midline and there is no pneumothorax. Suture anchors in right humeral head.   Family History  Problem Relation Age of Onset  . Anemia Mother   . CAD Mother   . Hypertension Mother   . CAD Brother    History  Substance Use Topics  . Smoking status: Never Smoker   . Smokeless tobacco: Not on file  . Alcohol Use: No   OB History   Grav Para Term Preterm Abortions TAB SAB Ect Mult Living                 Review of Systems  Constitutional: Negative for activity change and appetite change.  HENT: Negative for drooling.   Respiratory: Positive for chest tightness. Negative for cough and shortness of breath.   Cardiovascular: Positive for chest pain.  Gastrointestinal: Negative for nausea, vomiting and abdominal pain.  Genitourinary: Negative for dysuria, hematuria, vaginal bleeding and vaginal discharge.  Musculoskeletal: Negative for arthralgias and myalgias.  Skin: Negative for rash.  Neurological: Negative for dizziness, weakness and numbness.  A complete 10 system review of systems was obtained and all systems are negative except as noted in the HPI and PMH.      Allergies  Daypro; Statins; and Zetia  Home Medications   Prior to Admission medications   Medication Sig Start Date End Date Taking? Authorizing Provider  albuterol (PROVENTIL HFA;VENTOLIN HFA) 108 (90 BASE) MCG/ACT inhaler Inhale 2 puffs into the lungs every 6 (six) hours as needed for wheezing or shortness of breath.   Yes Historical Provider, MD  apixaban (ELIQUIS) 5 MG TABS tablet Take 5 mg by  mouth daily.   Yes Historical Provider, MD  aspirin 81 MG tablet Take 81 mg by mouth once.   Yes Historical Provider, MD  BIOTIN PO Take 1,000 mg by mouth daily.    Yes Historical Provider, MD  carvedilol (COREG) 3.125 MG tablet Take 3.125 mg by mouth 2 (two) times daily with a meal.   Yes Historical Provider, MD  Cholecalciferol (VITAMIN D PO) Take 1,000 Units by mouth daily.   Yes Historical Provider, MD  diazepam (VALIUM) 5 MG tablet Take 5 mg by mouth every 6 (six) hours as needed for anxiety.   Yes Historical Provider, MD  GRAPE SEED EXTRACT PO Take 50 mg by mouth daily.    Yes Historical Provider, MD  linagliptin (TRADJENTA) 5 MG TABS tablet Take 2.5 mg by mouth daily.    Yes Historical Provider, MD  losartan-hydrochlorothiazide (HYZAAR) 100-12.5 MG per tablet Take 1 tablet by mouth daily. 06/27/13  Yes Deboraha Sprang, MD  Magnesium 250 MG TABS 2 tabs po qd   Yes Historical Provider, MD  propranolol (INDERAL) 10 MG tablet Take 10 mg by mouth every 6 (six) hours as needed (racing heart).   Yes Historical Provider, MD  rOPINIRole (REQUIP) 1 MG tablet Take 1 mg by mouth at bedtime.   Yes Historical Provider, MD  vitamin B-12 (CYANOCOBALAMIN) 1000 MCG tablet Take 500 mcg by mouth 2 (two) times daily.    Yes Historical Provider, MD   BP 147/94  Pulse 80  Temp(Src) 97.7 F (36.5 C) (Oral)  Resp 21  Ht 5\' 2"  (1.575 m)  Wt 164 lb (74.39 kg)  BMI 29.99 kg/m2  SpO2 97% Physical Exam  Nursing note and vitals reviewed. Constitutional: She is oriented to person, place, and time. She appears well-developed and well-nourished. No distress.  HENT:  Head: Normocephalic and atraumatic.  Mouth/Throat: Oropharynx is clear and moist. No oropharyngeal exudate.  Eyes: Conjunctivae and EOM are normal. Pupils are equal, round, and reactive to light.  Neck: Normal range of motion. Neck supple.  No meningismus.  Cardiovascular: Normal rate, regular rhythm, normal heart sounds and intact distal pulses.    No murmur heard. Pulmonary/Chest: Effort normal and breath sounds normal. No respiratory distress.  Abdominal: Soft. There is no tenderness. There is no rebound and no guarding.  Musculoskeletal: Normal range of motion. She exhibits no edema and no tenderness.  Neurological: She is alert and oriented to person, place, and time. No cranial nerve deficit. She exhibits normal muscle tone. Coordination normal.  No ataxia on finger to nose bilaterally. No pronator drift. 5/5 strength throughout. CN 2-12 intact. Negative Romberg. Equal grip strength. Sensation intact. Gait is normal.   Skin: Skin is warm.  Psychiatric: She has a normal mood and affect. Her behavior is normal.    ED Course  Procedures (including critical care time) Labs Review  Labs Reviewed  COMPREHENSIVE METABOLIC PANEL - Abnormal; Notable for the following:    Sodium 128 (*)    Chloride 91 (*)    Glucose, Bld 111 (*)    Calcium 11.3 (*)    GFR calc non Af Amer 58 (*)    GFR calc Af Amer 67 (*)    All other components within normal limits  TROPONIN I - Abnormal; Notable for the following:    Troponin I 2.50 (*)    All other components within normal limits  I-STAT TROPOININ, ED - Abnormal; Notable for the following:    Troponin i, poc 1.41 (*)    All other components within normal limits  CBC WITH DIFFERENTIAL  APTT  PROTIME-INR  URINALYSIS, ROUTINE W REFLEX MICROSCOPIC  PHOSPHORUS  PTH, INTACT AND CALCIUM  I-STAT TROPOININ, ED    Imaging Review Dg Chest 2 View  09/28/2013   CLINICAL DATA:  Chest pain  EXAM: CHEST  2 VIEW  COMPARISON:  04/28/2013  FINDINGS: Normal heart size. Lungs are under aerated with bibasilar atelectasis. No pleural effusion or pneumothorax.  IMPRESSION: Bibasilar atelectasis.   Electronically Signed   By: Maryclare Bean M.D.   On: 09/28/2013 14:07     EKG Interpretation   Date/Time:  Sunday September 28 2013 14:10:33 EDT Ventricular Rate:  68 PR Interval:  181 QRS Duration: 108 QT Interval:   382 QTC Calculation: 406 R Axis:   86 Text Interpretation:  Sinus rhythm Borderline right axis deviation Low  voltage, extremity leads No significant change since last tracing  Confirmed by Wyvonnia Dusky  MD, [place] [phone]) on 09/28/2013 4:35:00 PM      MDM   Final diagnoses:  NSTEMI (non-ST elevated myocardial infarction)  Takotsubo cardiomyopathy   episode of chest pain radiating to left neck and left arm while sitting at church. Improved after one hour with aspirin. No history of similar pain. No shortness of breath, nausea and vomiting or diaphoresis.  EKG shows ST elevation in inferior lateral leads, no reciprocal change. Patient pain-free at this time.  EKG discussed on arrival with Dr. Irish Lack. He does not feel this represents STEMI at this time.  Recommends enzymes and echo.   Patient's troponin  Is 2.5.  She remains chest pain free.  Immediately discussed with Dr. Irish Lack again . He will come see patient and obtained stat echo.  He does not want STEMI activated at this time. Trace pericardial effusion seen on [place].  Stat echocardiogram shows trace pericardial effusion with akinesis of the apex consistent with Takosubo's cardiomyopathy. Dr. Claiborne Billings at bedside. No plans for emergent cath given  that the patient is chest pain-free and already anticoagulated.  She will be admitted to the cardiology service.    Ezequiel Essex, MD 09/28/13 [phone]

## 2013-09-28 NOTE — ED Notes (Signed)
Echocardiagram completed at bedside

## 2013-09-28 NOTE — Progress Notes (Signed)
09/28/2013 5:50 PM  Anxious  Crying. States legs hurt. No change in EKG. Valium 5 mg po given and reassured. Nolawi Kanady, Carolynn Comment

## 2013-09-28 NOTE — Progress Notes (Signed)
Echo Lab  2D Echocardiogram completed.  [place], RDCS 09/28/2013 3:08 PM

## 2013-09-29 DIAGNOSIS — I5181 Takotsubo syndrome: Principal | ICD-10-CM

## 2013-09-29 LAB — COMPREHENSIVE METABOLIC PANEL
ALT: 15 U/L (ref 0–35)
AST: 33 U/L (ref 0–37)
Albumin: 3.2 g/dL — ABNORMAL LOW (ref 3.5–5.2)
Alkaline Phosphatase: 81 U/L (ref 39–117)
BUN: 18 mg/dL (ref 6–23)
CO2: 22 mEq/L (ref 19–32)
Calcium: 10.2 mg/dL (ref 8.4–10.5)
Chloride: 92 mEq/L — ABNORMAL LOW (ref 96–112)
Creatinine, Ser: 0.95 mg/dL (ref 0.50–1.10)
GFR calc Af Amer: 64 mL/min — ABNORMAL LOW (ref >90)
GFR calc non Af Amer: 55 mL/min — ABNORMAL LOW (ref >90)
Glucose, Bld: 113 mg/dL — ABNORMAL HIGH (ref 70–99)
Potassium: 4.4 mEq/L (ref 3.7–5.3)
Sodium: 127 mEq/L — ABNORMAL LOW (ref 137–147)
Total Bilirubin: 0.8 mg/dL (ref 0.3–1.2)
Total Protein: 6.2 g/dL (ref 6.0–8.3)

## 2013-09-29 LAB — LIPID PANEL
Cholesterol: 170 mg/dL (ref 0–200)
HDL: 32 mg/dL — ABNORMAL LOW (ref >39)
LDL Cholesterol: 110 mg/dL — ABNORMAL HIGH (ref 0–99)
Total CHOL/HDL Ratio: 5.3 RATIO
Triglycerides: 139 mg/dL (ref <150)
VLDL: 28 mg/dL (ref 0–40)

## 2013-09-29 LAB — HEMOGLOBIN A1C
Hgb A1c MFr Bld: 6 % — ABNORMAL HIGH (ref <5.7)
Mean Plasma Glucose: 126 mg/dL — ABNORMAL HIGH (ref <117)

## 2013-09-29 LAB — PTH, INTACT AND CALCIUM
Calcium, Total (PTH): 10.2 mg/dL (ref 8.4–10.5)
PTH: 98 pg/mL — ABNORMAL HIGH (ref 14.0–72.0)

## 2013-09-29 LAB — GLUCOSE, CAPILLARY
Glucose-Capillary: 108 mg/dL — ABNORMAL HIGH (ref 70–99)
Glucose-Capillary: 109 mg/dL — ABNORMAL HIGH (ref 70–99)
Glucose-Capillary: 134 mg/dL — ABNORMAL HIGH (ref 70–99)
Glucose-Capillary: 82 mg/dL (ref 70–99)
Glucose-Capillary: 124 mg/dL — ABNORMAL HIGH (ref 70–99)

## 2013-09-29 LAB — CBC
HCT: 31.1 % — ABNORMAL LOW (ref 36.0–46.0)
Hemoglobin: 10.8 g/dL — ABNORMAL LOW (ref 12.0–15.0)
MCH: 31.1 pg (ref 26.0–34.0)
MCHC: 34.7 g/dL (ref 30.0–36.0)
MCV: 89.6 fL (ref 78.0–100.0)
Platelets: 300 10*3/uL (ref 150–400)
RBC: 3.47 MIL/uL — ABNORMAL LOW (ref 3.87–5.11)
RDW: 12.6 % (ref 11.5–15.5)
WBC: 7.8 10*3/uL (ref 4.0–10.5)

## 2013-09-29 LAB — HEPARIN LEVEL (UNFRACTIONATED): Heparin Unfractionated: 1.28 IU/mL — ABNORMAL HIGH (ref 0.30–0.70)

## 2013-09-29 LAB — MAGNESIUM: Magnesium: 1.9 mg/dL (ref 1.5–2.5)

## 2013-09-29 LAB — TSH: TSH: 2.87 u[IU]/mL (ref 0.350–4.500)

## 2013-09-29 LAB — TROPONIN I: Troponin I: 3.19 ng/mL (ref <0.30)

## 2013-09-29 LAB — APTT
aPTT: 122 seconds — ABNORMAL HIGH (ref 24–37)
aPTT: 85 seconds — ABNORMAL HIGH (ref 24–37)
aPTT: 85 seconds — ABNORMAL HIGH (ref 24–37)

## 2013-09-29 MED ORDER — ROPINIROLE HCL 1 MG PO TABS
1.0000 mg | ORAL_TABLET | Freq: Once | ORAL | Status: AC
  Administered 2013-09-29: 1 mg via ORAL
  Filled 2013-09-29: qty 1

## 2013-09-29 MED ORDER — ROPINIROLE HCL 1 MG PO TABS: 1.0000 mg | ORAL_TABLET | Freq: Every day | ORAL | Status: DC

## 2013-09-29 NOTE — Progress Notes (Signed)
[place] for Heparin Indication: chest pain/ACS, Afib  Allergies  Allergen Reactions  . Daypro [Oxaprozin]     Palpitation  . Statins     Myalgias  . Zetia [Ezetimibe]     Joint Pain, Swelling    Patient Measurements: Height: 5\' 2"  (157.5 cm) Weight: 158 lb 11.7 oz (72 kg) IBW/kg (Calculated) : 50.1 Heparin Dosing Weight: 65.4kg  Vital Signs: Temp: 98.6 F (37 C) (06/28 2349) Temp src: Oral (06/28 2349) BP: 98/44 mmHg (06/29 0100) Pulse Rate: 62 (06/29 0100)  Labs:  Recent Labs  09/28/13 1326 09/28/13 1428 09/28/13 1819 09/28/13 2235 09/29/13 0245  HGB 13.0  --   --   --   --   HCT 36.8  --   --   --   --   PLT 314  --   --   --   --   APTT  --  32  --   --  122*  LABPROT  --  13.9  --   --   --   INR  --  1.07  --   --   --   HEPARINUNFRC  --   --  1.02*  --   --   CREATININE 0.92  --   --   --   --   TROPONINI 2.50*  --  9.54* 5.67*  --     Estimated Creatinine Clearance: 46.1 ml/min (by C-G formula based on Cr of 0.92).  Assessment: 80 yo female with chest pain, Eliquis on hold, for heparin.   Goal of Therapy:  APTT 66-102 Heparin level 0.3-0.7 units/ml Monitor platelets by anticoagulation protocol: Yes   Plan:  Decrease Heparin 800 units/hr Check heparin level in 8 hours.   Phillis Knack, PharmD, BCPS  09/29/2013,3:19 AM

## 2013-09-29 NOTE — Progress Notes (Addendum)
ANTICOAGULATION CONSULT NOTE - Follow Up Consult  Pharmacy Consult for Heparin Indication: chest pain/ACS and afib  Allergies  Allergen Reactions  . Daypro [Oxaprozin]     Palpitation  . Statins     Myalgias  . Zetia [Ezetimibe]     Joint Pain, Swelling    Patient Measurements: Height: 5\' 2"  (157.5 cm) Weight: 160 lb 4.4 oz (72.7 kg) IBW/kg (Calculated) : 50.1 Heparin Dosing Weight: 65kg  Vital Signs: Temp: 97.1 F (36.2 C) (06/29 1119) Temp src: Oral (06/29 1119) BP: 115/46 mmHg (06/29 1119) Pulse Rate: 69 (06/29 1119)  Labs:  Recent Labs  09/28/13 1326 09/28/13 1428 09/28/13 1819 09/28/13 2235 09/29/13 0245 09/29/13 1150  HGB 13.0  --   --   --  10.8*  --   HCT 36.8  --   --   --  31.1*  --   PLT 314  --   --   --  300  --   APTT  --  32  --   --  122* 85*  LABPROT  --  13.9  --   --   --   --   INR  --  1.07  --   --   --   --   HEPARINUNFRC  --   --  1.02*  --  1.28*  --   CREATININE 0.92  --   --   --  0.95  --   TROPONINI 2.50*  --  9.54* 5.67* 3.19*  --     Estimated Creatinine Clearance: 44.8 ml/min (by C-G formula based on Cr of 0.95).   Medications:  Heparin @ 800 units/hr  Assessment: 81yof on eliquis pta (last dose 6/28 @ 0700), admitted with chest pain and transitioned to IV heparin. APTT is therapeutic after rate decrease this morning. Heparin level remains elevated indicating eliquis is still on board. Will continue to use aPTTs for monitoring until eliquis is cleared. CBC stable. No bleeding reported. Noted plan for cath tomorrow.  Goal of Therapy:  Heparin level 0.3-0.7 aPTT 66-102 seconds Monitor platelets by anticoagulation protocol: Yes   Plan:  1) Continue heparin @ 800 units/hr 2) Check aPTT in 8 hours to confirm  Deboraha Sprang 09/29/2013,12:48 PM  Addendum:  Follow up aptt was unchanged at 85s on 800 units/hr. No bleeding issues noted. Will continue current rate and check aptt as well as anti-xa level in am.  Erin Hearing PharmD., BCPS Clinical Pharmacist Pager [phone] 09/29/2013 9:06 PM

## 2013-09-29 NOTE — Progress Notes (Addendum)
Subjective:  No chest pain; Feels well  Objective:   Vital Signs in the last 24 hours: Temp:  [97.4 F (36.3 C)-98.6 F (37 C)] 97.4 F (36.3 C) (06/29 0700) Pulse Rate:  [58-113] 64 (06/29 0700) Resp:  [13-26] 18 (06/29 0700) BP: (95-175)/(41-94) 118/58 mmHg (06/29 0700) SpO2:  [91 %-100 %] 100 % (06/29 0700) Weight:  [158 lb 11.7 oz (72 kg)-164 lb (74.39 kg)] 160 lb 4.4 oz (72.7 kg) (06/29 0418)  Intake/Output from previous day: 06/28 0701 - 06/29 0700 In: 607.6 [P.O.:360; I.V.:247.6] Out: 650 [Urine:650]  Medications: . carvedilol  3.125 mg Oral BID WC  . losartan  100 mg Oral Daily   And  . hydrochlorothiazide  12.5 mg Oral Daily  . insulin aspart  0-15 Units Subcutaneous TID WC  . linagliptin  2.5 mg Oral Daily  . rOPINIRole  2 mg Oral QHS  . sodium chloride  3 mL Intravenous Q12H    . heparin 800 Units/hr (09/29/13 0700)    Physical Exam:   General appearance: alert, cooperative and no distress Neck: no adenopathy, no JVD, supple, symmetrical, trachea midline and thyroid not enlarged, symmetric, no tenderness/mass/nodules Lungs: clear to auscultation bilaterally Heart: regular rate and rhythm and no rub Abdomen: soft, non-tender; bowel sounds normal; no masses,  no organomegaly Extremities: no edema, redness or tenderness in the calves or thighs Pulses: 2+ and symmetric Skin: Skin color, texture, turgor normal. No rashes or lesions Neurologic: Grossly normal        Rate: 68  Rhythm: normal sinus rhythm  ECG today: MST small Q in 3; 0.5 mm J point elevation in 2 and V6.  Lab Results:   Recent Labs  09/28/13 1326 09/29/13 0245  NA 128* 127*  K 4.5 4.4  CL 91* 92*  CO2 23 22  GLUCOSE 111* 113*  BUN 19 18  CREATININE 0.92 0.95     Recent Labs  09/28/13 2235 09/29/13 0245  TROPONINI 5.67* 3.19*    Hepatic Function Panel  Recent Labs  09/29/13 0245  PROT 6.2  ALBUMIN 3.2*  AST 33  ALT 15  ALKPHOS 81  BILITOT 0.8    Recent  Labs  09/28/13 1428  INR 1.07   BNP (last 3 results) No results found for this basename: PROBNP,  in the last 8760 hours  Lipid Panel     Component Value Date/Time   CHOL 170 09/29/2013 0245   TRIG 139 09/29/2013 0245   HDL 32* 09/29/2013 0245   CHOLHDL 5.3 09/29/2013 0245   VLDL 28 09/29/2013 0245   LDLCALC 110* 09/29/2013 0245      Imaging:  Dg Chest 2 View  09/28/2013   CLINICAL DATA:  Chest pain  EXAM: CHEST  2 VIEW  COMPARISON:  04/28/2013  FINDINGS: Normal heart size. Lungs are under aerated with bibasilar atelectasis. No pleural effusion or pneumothorax.  IMPRESSION: Bibasilar atelectasis.   Electronically Signed   By: Maryclare Bean M.D.   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  #2 paroxysmal atrial fibrillation.  #3 chronic anticoagulation with Eliquis #4 hypercalcemia.  #5 type 2 diabetes mellitus with autonomic neuropathy.  #6 mild hyponatremia.  #7 osteoporosis.  #8 hyperlipidemia  Pt continues to be pain free,, No evolutionary changes on ECG Troponin positive for NSTEMI with probable Takusubo cardiomyopathy.  Last dose of eliquis was yesterday am; on heparin transitiion. Plan cath tomorrow; discussed with pt and family. PTH level sent with hypercalcemia. No signs of CHF. Will titrate coreg as BP and Pulse allow; continue ARB.   Troy Sine, MD, [facility] 09/29/2013, 7:58 AM

## 2013-09-30 ENCOUNTER — Encounter (HOSPITAL_COMMUNITY)
Admission: EM | Disposition: A | Discharge status: Home / Self Care | Payer: Medicare Other | Source: Home / Self Care | Attending: Cardiovascular Disease

## 2013-09-30 DIAGNOSIS — I2510 Atherosclerotic heart disease of native coronary artery without angina pectoris: Secondary | ICD-10-CM

## 2013-09-30 HISTORY — PX: LEFT HEART CATHETERIZATION WITH CORONARY ANGIOGRAM: SHX5451

## 2013-09-30 LAB — GLUCOSE, CAPILLARY
Glucose-Capillary: 102 mg/dL — ABNORMAL HIGH (ref 70–99)
Glucose-Capillary: 90 mg/dL (ref 70–99)
Glucose-Capillary: 95 mg/dL (ref 70–99)
Glucose-Capillary: 131 mg/dL — ABNORMAL HIGH (ref 70–99)

## 2013-09-30 LAB — CBC
HCT: 29.3 % — ABNORMAL LOW (ref 36.0–46.0)
Hemoglobin: 10.5 g/dL — ABNORMAL LOW (ref 12.0–15.0)
MCH: 31.2 pg (ref 26.0–34.0)
MCHC: 35.8 g/dL (ref 30.0–36.0)
MCV: 86.9 fL (ref 78.0–100.0)
Platelets: 293 10*3/uL (ref 150–400)
RBC: 3.37 MIL/uL — ABNORMAL LOW (ref 3.87–5.11)
RDW: 12.4 % (ref 11.5–15.5)
WBC: 9.9 10*3/uL (ref 4.0–10.5)

## 2013-09-30 LAB — POCT ACTIVATED CLOTTING TIME: Activated Clotting Time: 107 seconds

## 2013-09-30 LAB — HEPARIN LEVEL (UNFRACTIONATED): Heparin Unfractionated: 0.6 IU/mL (ref 0.30–0.70)

## 2013-09-30 LAB — APTT: aPTT: 70 seconds — ABNORMAL HIGH (ref 24–37)

## 2013-09-30 SURGERY — LEFT HEART CATHETERIZATION WITH CORONARY ANGIOGRAM
Anesthesia: LOCAL

## 2013-09-30 MED ORDER — SODIUM CHLORIDE 0.9 % IJ SOLN
3.0000 mL | INTRAMUSCULAR | Status: DC | PRN
Start: 2013-09-30 — End: 2013-10-01

## 2013-09-30 MED ORDER — SODIUM CHLORIDE 0.9 % IV SOLN
INTRAVENOUS | Status: DC
  Administered 2013-09-30 – 2013-10-01 (×2): via INTRAVENOUS

## 2013-09-30 MED ORDER — MIDAZOLAM HCL 2 MG/2ML IJ SOLN
INTRAMUSCULAR | Status: AC
  Filled 2013-09-30: qty 2

## 2013-09-30 MED ORDER — SODIUM CHLORIDE 0.9 % IJ SOLN: 3.0000 mL | Freq: Two times a day (BID) | INTRAMUSCULAR | Status: DC

## 2013-09-30 MED ORDER — HEPARIN (PORCINE) IN NACL 2-0.9 UNIT/ML-% IJ SOLN
INTRAMUSCULAR | Status: AC
  Filled 2013-09-30: qty 1000

## 2013-09-30 MED ORDER — FENTANYL CITRATE 0.05 MG/ML IJ SOLN
INTRAMUSCULAR | Status: AC
  Filled 2013-09-30: qty 2

## 2013-09-30 MED ORDER — SODIUM CHLORIDE 0.9 % IV SOLN: INTRAVENOUS | Status: DC

## 2013-09-30 MED ORDER — SODIUM CHLORIDE 0.9 % IV SOLN: 250.0000 mL | INTRAVENOUS | Status: DC | PRN

## 2013-09-30 MED ORDER — ACETAMINOPHEN 325 MG PO TABS: 650.0000 mg | ORAL_TABLET | ORAL | Status: DC | PRN

## 2013-09-30 MED ORDER — LIDOCAINE HCL (PF) 1 % IJ SOLN
INTRAMUSCULAR | Status: AC
  Filled 2013-09-30: qty 30

## 2013-09-30 MED ORDER — ONDANSETRON HCL 4 MG/2ML IJ SOLN: 4.0000 mg | Freq: Four times a day (QID) | INTRAMUSCULAR | Status: DC | PRN

## 2013-09-30 MED ORDER — NITROGLYCERIN 0.2 MG/ML ON CALL CATH LAB
INTRAVENOUS | Status: AC
  Filled 2013-09-30: qty 1

## 2013-09-30 NOTE — Progress Notes (Signed)
Site area: right groin  Site Prior to Removal:  Level 0  Pressure Applied For 20   MINUTES    Manual:   Yes.    Patient Status During Pull:  Stable   Post Pull Groin Site:  Level 0  Post Pull Instructions Given:  Yes.    Post Pull Pulses Present:  Yes.    Dressing Applied:  Yes   Bedrest begins 3744 Comments no complications

## 2013-09-30 NOTE — Progress Notes (Signed)
ANTICOAGULATION CONSULT NOTE - Follow Up Consult  Pharmacy Consult for Heparin Indication: chest pain/ACS and afib  Allergies  Allergen Reactions  . Daypro [Oxaprozin]     Palpitation  . Statins     Myalgias  . Zetia [Ezetimibe]     Joint Pain, Swelling    Patient Measurements: Height: 5\' 2"  (157.5 cm) Weight: 158 lb 15.2 oz (72.1 kg) IBW/kg (Calculated) : 50.1 Heparin Dosing Weight: 65kg  Vital Signs: Temp: 98.2 F (36.8 C) (06/30 0721) Temp src: Oral (06/30 0721) BP: 95/40 mmHg (06/30 0721) Pulse Rate: 72 (06/30 0721)  Labs:  Recent Labs  09/28/13 1326  09/28/13 1428 09/28/13 1819 09/28/13 2235 09/29/13 0245 09/29/13 1150 09/29/13 1945 09/30/13 0333  HGB 13.0  --   --   --   --  10.8*  --   --  10.5*  HCT 36.8  --   --   --   --  31.1*  --   --  29.3*  PLT 314  --   --   --   --  300  --   --  293  APTT  --   < > 32  --   --  122* 85* 85* 70*  LABPROT  --   --  13.9  --   --   --   --   --   --   INR  --   --  1.07  --   --   --   --   --   --   HEPARINUNFRC  --   --   --  1.02*  --  1.28*  --   --  0.60  CREATININE 0.92  --   --   --   --  0.95  --   --   --   TROPONINI 2.50*  --   --  9.54* 5.67* 3.19*  --   --   --   < > = values in this interval not displayed.  Estimated Creatinine Clearance: 44.6 ml/min (by C-G formula based on Cr of 0.95).   Medications:  Heparin @ 800 units/hr  Assessment: 81yof on eliquis pta (last dose 6/28 @ 0700), admitted with chest pain and transitioned to IV heparin. Both aPTT and heparin level are therapeutic this morning. CBC is stable. No bleeding reported. For cath today.  Goal of Therapy:  Heparin level 0.3-0.7 aPTT 66-102 seconds Monitor platelets by anticoagulation protocol: Yes   Plan:  1) Continue heparin @ 800 units/hr 2) Follow up after cath  Deboraha Sprang 09/30/2013,8:16 AM

## 2013-09-30 NOTE — Progress Notes (Signed)
Pt Afib HR 120-130. Asymptomatic. Dr. Delane Ginger notified. No new orders at this time. Will continue to monitor closely.

## 2013-09-30 NOTE — Progress Notes (Addendum)
SUBJECTIVE:  No chest pain. Awaiting cath  OBJECTIVE:   Vitals:   Filed Vitals:   09/30/13 0500 09/30/13 0721 09/30/13 0800 09/30/13 1020  BP: 117/56 95/40    Pulse:  72    Temp: 98.2 F (36.8 C) 98.2 F (36.8 C)    TempSrc: Oral Oral    Resp: 25 18 25    Height:      Weight: 158 lb 15.2 oz (72.1 kg)   158 lb 15.2 oz (72.1 kg)  SpO2: 96% 96%     I&O's:   Intake/Output Summary (Last 24 hours) at 09/30/13 1025 Last data filed at 09/30/13 0800  Gross per 24 hour  Intake    738 ml  Output    700 ml  Net     38 ml   TELEMETRY: Reviewed telemetry pt in NSR:     PHYSICAL EXAM General: Well developed, well nourished, in no acute distress Head:   Normal cephalic and atramatic  Lungs:   Clear bilaterally to auscultation. Heart:   HRRR S1 S2  No JVD.   Abdomen: abdomen soft and non-tender Msk:  Back normal,  Normal strength and tone for age. Extremities:   No edema.  2+ right radial pulse Neuro: Alert and oriented. Psych:  Normal affect, responds appropriately   LABS: Basic Metabolic Panel:  Recent Labs  09/28/13 1326 09/28/13 1640 09/28/13 1819 09/29/13 0245  NA 128*  --   --  127*  K 4.5  --   --  4.4  CL 91*  --   --  92*  CO2 23  --   --  22  GLUCOSE 111*  --   --  113*  BUN 19  --   --  18  CREATININE 0.92  --   --  0.95  CALCIUM 11.3* 10.2  --  10.2  MG  --   --  2.2 1.9  PHOS  --  2.2*  --   --    Liver Function Tests:  Recent Labs  09/28/13 1326 09/29/13 0245  AST 24 33  ALT 16 15  ALKPHOS 93 81  BILITOT 0.6 0.8  PROT 7.5 6.2  ALBUMIN 4.1 3.2*   No results found for this basename: LIPASE, AMYLASE,  in the last 72 hours CBC:  Recent Labs  09/28/13 1326 09/29/13 0245 09/30/13 0333  WBC 7.7 7.8 9.9  NEUTROABS 4.7  --   --   HGB 13.0 10.8* 10.5*  HCT 36.8 31.1* 29.3*  MCV 88.0 89.6 86.9  PLT 314 300 293   Cardiac Enzymes:  Recent Labs  09/28/13 1819 09/28/13 2235 09/29/13 0245  TROPONINI 9.54* 5.67* 3.19*   BNP: No  components found with this basename: POCBNP,  D-Dimer: No results found for this basename: DDIMER,  in the last 72 hours Hemoglobin A1C:  Recent Labs  09/28/13 1819  HGBA1C 6.0*   Fasting Lipid Panel:  Recent Labs  09/29/13 0245  CHOL 170  HDL 32*  LDLCALC 110*  TRIG 139  CHOLHDL 5.3   Thyroid Function Tests:  Recent Labs  09/29/13 0245  TSH 2.870   Anemia Panel: No results found for this basename: VITAMINB12, FOLATE, FERRITIN, TIBC, IRON, RETICCTPCT,  in the last 72 hours Coag Panel:   Lab Results  Component Value Date   INR 1.07 09/28/2013    RADIOLOGY: Dg Chest 2 View  09/28/2013   CLINICAL DATA:  Chest pain  EXAM: CHEST  2 VIEW  COMPARISON:  04/28/2013  FINDINGS:  Normal heart size. Lungs are under aerated with bibasilar atelectasis. No pleural effusion or pneumothorax.  IMPRESSION: Bibasilar atelectasis.   Electronically Signed   By: Maryclare Bean M.D.   On: 09/28/2013 14:07      ASSESSMENT: Presumed Takotsubo cardiomyopathy  PLAN:  Cath today.  Continue medical therapy for LV dysfunction.  BP controlled.  Consider closure device if groin approach used due to restless leg syndrome.  Jettie Booze., MD  09/30/2013  10:25 AM

## 2013-09-30 NOTE — H&P (View-Only) (Signed)
SUBJECTIVE:  No chest pain. Awaiting cath  OBJECTIVE:   Vitals:   Filed Vitals:   09/30/13 0500 09/30/13 0721 09/30/13 0800 09/30/13 1020  BP: 117/56 95/40    Pulse:  72    Temp: 98.2 F (36.8 C) 98.2 F (36.8 C)    TempSrc: Oral Oral    Resp: 25 18 25    Height:      Weight: 158 lb 15.2 oz (72.1 kg)   158 lb 15.2 oz (72.1 kg)  SpO2: 96% 96%     I&O's:   Intake/Output Summary (Last 24 hours) at 09/30/13 1025 Last data filed at 09/30/13 0800  Gross per 24 hour  Intake    738 ml  Output    700 ml  Net     38 ml   TELEMETRY: Reviewed telemetry pt in NSR:     PHYSICAL EXAM General: Well developed, well nourished, in no acute distress Head:   Normal cephalic and atramatic  Lungs:   Clear bilaterally to auscultation. Heart:   HRRR S1 S2  No JVD.   Abdomen: abdomen soft and non-tender Msk:  Back normal,  Normal strength and tone for age. Extremities:   No edema.  2+ right radial pulse Neuro: Alert and oriented. Psych:  Normal affect, responds appropriately   LABS: Basic Metabolic Panel:  Recent Labs  09/28/13 1326 09/28/13 1640 09/28/13 1819 09/29/13 0245  NA 128*  --   --  127*  K 4.5  --   --  4.4  CL 91*  --   --  92*  CO2 23  --   --  22  GLUCOSE 111*  --   --  113*  BUN 19  --   --  18  CREATININE 0.92  --   --  0.95  CALCIUM 11.3* 10.2  --  10.2  MG  --   --  2.2 1.9  PHOS  --  2.2*  --   --    Liver Function Tests:  Recent Labs  09/28/13 1326 09/29/13 0245  AST 24 33  ALT 16 15  ALKPHOS 93 81  BILITOT 0.6 0.8  PROT 7.5 6.2  ALBUMIN 4.1 3.2*   No results found for this basename: LIPASE, AMYLASE,  in the last 72 hours CBC:  Recent Labs  09/28/13 1326 09/29/13 0245 09/30/13 0333  WBC 7.7 7.8 9.9  NEUTROABS 4.7  --   --   HGB 13.0 10.8* 10.5*  HCT 36.8 31.1* 29.3*  MCV 88.0 89.6 86.9  PLT 314 300 293   Cardiac Enzymes:  Recent Labs  09/28/13 1819 09/28/13 2235 09/29/13 0245  TROPONINI 9.54* 5.67* 3.19*   BNP: No  components found with this basename: POCBNP,  D-Dimer: No results found for this basename: DDIMER,  in the last 72 hours Hemoglobin A1C:  Recent Labs  09/28/13 1819  HGBA1C 6.0*   Fasting Lipid Panel:  Recent Labs  09/29/13 0245  CHOL 170  HDL 32*  LDLCALC 110*  TRIG 139  CHOLHDL 5.3   Thyroid Function Tests:  Recent Labs  09/29/13 0245  TSH 2.870   Anemia Panel: No results found for this basename: VITAMINB12, FOLATE, FERRITIN, TIBC, IRON, RETICCTPCT,  in the last 72 hours Coag Panel:   Lab Results  Component Value Date   INR 1.07 09/28/2013    RADIOLOGY: Dg Chest 2 View  09/28/2013   CLINICAL DATA:  Chest pain  EXAM: CHEST  2 VIEW  COMPARISON:  04/28/2013  FINDINGS:  Normal heart size. Lungs are under aerated with bibasilar atelectasis. No pleural effusion or pneumothorax.  IMPRESSION: Bibasilar atelectasis.   Electronically Signed   By: Maryclare Bean M.D.   On: 09/28/2013 14:07      ASSESSMENT: Presumed Takotsubo cardiomyopathy  PLAN:  Cath today.  Continue medical therapy for LV dysfunction.  BP controlled.  Consider closure device if groin approach used due to restless leg syndrome.  Jettie Booze., MD  09/30/2013  10:25 AM

## 2013-09-30 NOTE — Interval H&P Note (Signed)
Cath Lab Visit (complete for each Cath Lab visit)  Clinical Evaluation Leading to the Procedure:   ACS: Yes.    Non-ACS:    Anginal Classification: CCS III  Anti-ischemic medical therapy: Minimal Therapy (1 class of medications)  Non-Invasive Test Results: No non-invasive testing performed  Prior CABG: No previous CABG      History and Physical Interval Note:  09/30/2013 4:37 PM  Kelly Boone  has presented today for surgery, with the diagnosis of cp  The various methods of treatment have been discussed with the patient and family. After consideration of risks, benefits and other options for treatment, the patient has consented to  Procedure(s): LEFT HEART CATHETERIZATION WITH CORONARY ANGIOGRAM (N/A) as a surgical intervention .  The patient's history has been reviewed, patient examined, no change in status, stable for surgery.  I have reviewed the patient's chart and labs.  Questions were answered to the patient's satisfaction.     Kelly Boone A

## 2013-09-30 NOTE — Progress Notes (Signed)
Right femoral arterial sheath pulled by [facility]

## 2013-09-30 NOTE — Progress Notes (Signed)
Transferred to 2H15 in bed.

## 2013-09-30 NOTE — CV Procedure (Signed)
Kelly Boone is a 80 y.o. female    [id number]  [id number] LOCATION:  FACILITY: [facility]  PHYSICIAN: Troy Sine, MD, [facility] September 22, 1931   DATE OF PROCEDURE:  09/30/2013    CARDIAC CATHETERIZATION     HISTORY:    [name] is a 80 y.o. female   PROCEDURE: Left heart catheterization: Coronary angiography, left ventriculography.  The patient was brought to the [facility] Cardiac cath lab in the postabsorptive state. She was premedicated with Versed 1 mg and fentanyl 25 mcg.  Her right groin was prepped and shaved in usual sterile fashion. Xylocaine 1% was used for local anesthesia. A 5 French sheath was inserted into the R femoral artery. Diagnostic catheterizatiion was done with 5 [place] LF4, FR4, no torque right, Williams right catheter and pigtail catheters. Left ventriculography was done with 27 cc Omnipaque contrast. Hemostasis was obtained by direct manual compression. The patient tolerated the procedure well.   HEMODYNAMICS:   Central Aorta: 120/50   Left Ventricle: 120/11/24  ANGIOGRAPHY:  Left main: Angiographically normal and bifurcated into the LAD and left circumflex artery   LAD: The LAD had mild 20% proximal irregularity after the first septal perforating artery and before the first diagonal vessel.  The mid LAD did intramyocardially, but there was no evidence for mid systolic bridging.  The LAD extended to and wrapped around the LV apex and was otherwise free of obstructive disease.  Left circumflex: Angiographically normal vessel and gave rise to one major marginal branch.   Right coronary artery: Dominant vessel, which had mild luminal irregularity of 20 and 30% proximally.   Left ventriculography revealed vigorous LV contractility involving the anterior and posterior basal myocardium. There is significant akinesis extending from the mid anterolateral wall, involving the apex and extending to the mid inferior wall, suggestive of Takusubo  cardiomyopathy.   Ejection fraction is probably in the 35-40% range due to the very vigorous, basal hypercontractility.  IMPRESSION:  Moderate LV dysfunction with akinesis involving the mid distal anterolateral wall, apex, and mid distal inferior wall, suggestive of Takusubo cardiomyopathy.  Mild nonobstructive CAD with 20% smooth narrowing in the proximal LAD and with a mid-intramyocardial LAD segment, without muscle bridging, and 20 and 30% proximal luminal irregularity of the RCA.  RECOMMENDATION:  Medical therapy with with medication titration as blood pressure and pulse allow with hopeful  recovery of LV function in this patient with recent significant increase stress secondary to multiple family members with severe illness and probable Takusubo cardiomyopathy.     Troy Sine, MD, [facility] 09/30/2013 5:24 PM

## 2013-09-30 NOTE — Progress Notes (Signed)
Called to pt room for pt vomiting. After episode, pt noted to be in Afib a rate of 90-100. Dr. Delane Ginger notified. No new orders at this time.

## 2013-09-30 NOTE — Progress Notes (Signed)
0.9NS adjusted to 100cc/hr

## 2013-10-01 ENCOUNTER — Other Ambulatory Visit: Payer: Self-pay | Admitting: Physician Assistant

## 2013-10-01 DIAGNOSIS — I422 Other hypertrophic cardiomyopathy: Secondary | ICD-10-CM

## 2013-10-01 LAB — CBC
HCT: 27.9 % — ABNORMAL LOW (ref 36.0–46.0)
Hemoglobin: 9.9 g/dL — ABNORMAL LOW (ref 12.0–15.0)
MCH: 31.3 pg (ref 26.0–34.0)
MCHC: 35.5 g/dL (ref 30.0–36.0)
MCV: 88.3 fL (ref 78.0–100.0)
Platelets: 273 10*3/uL (ref 150–400)
RBC: 3.16 MIL/uL — ABNORMAL LOW (ref 3.87–5.11)
RDW: 12.6 % (ref 11.5–15.5)
WBC: 7.9 10*3/uL (ref 4.0–10.5)

## 2013-10-01 LAB — APTT: aPTT: 33 seconds (ref 24–37)

## 2013-10-01 LAB — GLUCOSE, CAPILLARY
Glucose-Capillary: 78 mg/dL (ref 70–99)
Glucose-Capillary: 136 mg/dL — ABNORMAL HIGH (ref 70–99)

## 2013-10-01 MED ORDER — APIXABAN 5 MG PO TABS
5.0000 mg | ORAL_TABLET | Freq: Two times a day (BID) | ORAL | Status: DC
  Administered 2013-10-01: 5 mg via ORAL
  Filled 2013-10-01 (×2): qty 1

## 2013-10-01 NOTE — Discharge Summary (Signed)
Discharge Summary   Patient ID: Kelly Boone,  MRN: [id number], DOB/AGE: Mar 02, 1932 80 y.o.  Admit date: 09/28/2013 Discharge date: 10/01/2013  Primary Care Provider: [facility] Primary Cardiologist: Dr. Virl Axe  Discharge Diagnoses Principal Problem:   Takotsubo syndrome Active Problems:   A-fib   Hypertension   Sinus bradycardia   Allergies Allergies  Allergen Reactions  . Daypro [Oxaprozin]     Palpitation  . Statins     Myalgias  . Zetia [Ezetimibe]     Joint Pain, Swelling    Procedures  Echocardiogram 09/28/2013 LV EF: 30% - 35%  ------------------------------------------------------------------- Indications: Chest pain 786.51.  ------------------------------------------------------------------- History: PMH: Atrial fibrillation. Risk factors: Hypertension.  ------------------------------------------------------------------- Study Conclusions  - Left ventricle: The cavity size was normal. Systolic function was moderately to severely reduced. The estimated ejection fraction was in the range of 30% to 35%. There was severe hypokinesis to akinesis involving the mid distal septal wall, apex which extends to the mid inferolateral wall with apical ballooning suggestive of Takusubo cardiomyopathy. Doppler parameters are consistent with abnormal left ventricular relaxation (grade 1 diastolic dysfunction). - Mitral valve: Mildly calcified annulus. There was mild regurgitation. - Tricuspid valve: There was mild regurgitation.    PROCEDURE: Left heart catheterization: Coronary angiography, left ventriculography.  HEMODYNAMICS:  Central Aorta: 120/50  Left Ventricle: 120/11/24  ANGIOGRAPHY:  Left main: Angiographically normal and bifurcated into the LAD and left circumflex artery  LAD: The LAD had mild 20% proximal irregularity after the first septal perforating artery and before the first diagonal vessel. The mid LAD did intramyocardially, but there  was no evidence for mid systolic bridging. The LAD extended to and wrapped around the LV apex and was otherwise free of obstructive disease.  Left circumflex: Angiographically normal vessel and gave rise to one major marginal branch.  Right coronary artery: Dominant vessel, which had mild luminal irregularity of 20 and 30% proximally.  Left ventriculography revealed vigorous LV contractility involving the anterior and posterior basal myocardium. There is significant akinesis extending from the mid anterolateral wall, involving the apex and extending to the mid inferior wall, suggestive of Takusubo cardiomyopathy. Ejection fraction is probably in the 35-40% range due to the very vigorous, basal hypercontractility.  IMPRESSION:  Moderate LV dysfunction with akinesis involving the mid distal anterolateral wall, apex, and mid distal inferior wall, suggestive of Takusubo cardiomyopathy.  Mild nonobstructive CAD with 20% smooth narrowing in the proximal LAD and with a mid-intramyocardial LAD segment, without muscle bridging, and 20 and 30% proximal luminal irregularity of the RCA.  RECOMMENDATION:  Medical therapy with with medication titration as blood pressure and pulse allow with hopeful recovery of LV function in this patient with recent significant increase stress secondary to multiple family members with severe illness and probable Takusubo cardiomyopathy.   Hospital Course  The patient is a 80 year-old Caucasian female with past medical history of diabetes, hypertension and paroxysmal atrial fibrillation who recently establish care with Dr. Caryl Comes. She had a nuclear stress test that was done in [facility] in January 2015 which showed ejection fraction of 70% with normal perfusion. She also had echocardiogram which showed a normal systolic function without wall motion abnormalities. In consideration of her high CHA2DS2-VASC score, eliquis was started. Patient has been under significant increased  stress with her sister who is dying and her husband who recently had a stroke.   She presented to the ED on 09/20/2013 after experienced new-onset chest discomfort radiating to her jaw. The chest pain lasted for an hour  and eventually subsided. Her EKG showed mild ST elevation in inferior lateral leads. Due to concern of possible pericarditis like changes, a stat echocardiogram was obtained in the ED which showed a possible cardiomyopathy pattern with EF 78-29%, grade 1 diastolic dysfunction, severe hypokinesis to akinesis involving the mid distal septal wall, apical ballooning suggestive of possible cardiomyopathy. She eventually underwent cardiac catheterization on 09/30/2013 which showed EF 35-40% range, with akinesis involving the mid distal anterolateral wall, apex and the mid distal inferior wall consistent with a possible cardiomyopathy, there's mild nonobstructive CAD with 20% narrowing in proximal LAD, and 20-30% proximal irregularity the RCA, however no significant coronary disease. Post cath, patient had some weakness due to lack of activity during the hospitalization. She was evaluated by cardiac rehabilitation on 10/01/2013 who recommended stress relief. Patient does display interest in [facility] in [place] and a referral was placed. PT was consulted who believe the patient does not need further acute physical therapy at this time.  Patient was seen the morning of 10/01/2013 at which time she is deemed stable for discharge. She denies any chest discomfort. She had mild shortness of breath at baseline, however no recent increase. Her right groin cath site appears clean, dry, intact, without significant bleeding or hematoma. An one month outpatient echocardiogram was scheduled to assess her LV function. She also had a previously scheduled followup with Dr. Caryl Comes.    Discharge Vitals Blood pressure 94/38, pulse 94, temperature 97.8 F (36.6 C), temperature source Oral, resp. rate 20, height 5\' 2"  (1.575  m), weight 158 lb 15.2 oz (72.1 kg), SpO2 93.00%.  Filed Weights   09/29/13 0418 09/30/13 0500 09/30/13 1020  Weight: 160 lb 4.4 oz (72.7 kg) 158 lb 15.2 oz (72.1 kg) 158 lb 15.2 oz (72.1 kg)    Labs  CBC  Recent Labs  09/30/13 0333 10/01/13 0317  WBC 9.9 7.9  HGB 10.5* 9.9*  HCT 29.3* 27.9*  MCV 86.9 88.3  PLT 293 562   Basic Metabolic Panel  Recent Labs  09/28/13 1640 09/28/13 1819 09/29/13 0245  NA  --   --  127*  K  --   --  4.4  CL  --   --  92*  CO2  --   --  22  GLUCOSE  --   --  113*  BUN  --   --  18  CREATININE  --   --  0.95  CALCIUM 10.2  --  10.2  MG  --  2.2 1.9  PHOS 2.2*  --   --    Liver Function Tests  Recent Labs  09/29/13 0245  AST 33  ALT 15  ALKPHOS 81  BILITOT 0.8  PROT 6.2  ALBUMIN 3.2*   No results found for this basename: LIPASE, AMYLASE,  in the last 72 hours Cardiac Enzymes  Recent Labs  09/28/13 1819 09/28/13 2235 09/29/13 0245  TROPONINI 9.54* 5.67* 3.19*   Hemoglobin A1C  Recent Labs  09/28/13 1819  HGBA1C 6.0*   Fasting Lipid Panel  Recent Labs  09/29/13 0245  CHOL 170  HDL 32*  LDLCALC 110*  TRIG 139  CHOLHDL 5.3   Thyroid Function Tests  Recent Labs  09/29/13 0245  TSH 2.870    Disposition  Pt is being discharged home today in good condition.  Follow-up Plans & Appointments      Follow-up Information   Follow up with Virl Axe, MD On 12/30/2013. (2:00pm)    Specialty:  Cardiology   Contact  information:   [address] [address] [phone]       Follow up with [facility] On 11/03/2013. (10:30am for repeat echocardiogram)    Specialty:  Cardiology   Contact information:   [address] [place] [address] [phone]      Discharge Medications    Medication List    STOP taking these medications       propranolol 10 MG tablet  Commonly known as:  INDERAL      TAKE these medications        albuterol 108 (90 BASE) MCG/ACT inhaler  Commonly known as:  PROVENTIL HFA;VENTOLIN HFA  Inhale 2 puffs into the lungs every 6 (six) hours as needed for wheezing or shortness of breath.     aspirin 81 MG tablet  Take 81 mg by mouth once.     BIOTIN PO  Take 1,000 mg by mouth daily.     carvedilol 3.125 MG tablet  Commonly known as:  COREG  Take 3.125 mg by mouth 2 (two) times daily with a meal.     diazepam 5 MG tablet  Commonly known as:  VALIUM  Take 5 mg by mouth every 6 (six) hours as needed for anxiety.     ELIQUIS 5 MG Tabs tablet  Generic drug:  apixaban  Take 5 mg by mouth daily.     GRAPE SEED EXTRACT PO  Take 50 mg by mouth daily.     losartan-hydrochlorothiazide 100-12.5 MG per tablet  Commonly known as:  HYZAAR  Take 1 tablet by mouth daily.     Magnesium 250 MG Tabs  2 tabs po qd     rOPINIRole 1 MG tablet  Commonly known as:  REQUIP  Take 2 mg by mouth at bedtime.     TRADJENTA 5 MG Tabs tablet  Generic drug:  linagliptin  Take 2.5 mg by mouth daily.     vitamin B-12 1000 MCG tablet  Commonly known as:  CYANOCOBALAMIN  Take 500 mcg by mouth 2 (two) times daily.     VITAMIN D PO  Take 1,000 Units by mouth daily.        Outstanding Labs/Studies  Outpatient echocardiogram in 1 month as scheduled above  Duration of Discharge Encounter   Greater than 30 minutes including physician time.  Hilbert Corrigan PA 10/01/2013, 4:16 PM   I have examined the patient and reviewed assessment and plan and discussed with patient.  Agree with above as stated.  Medical therapy for Takotsubo cardiomyopathy. F/u echo in one month.  VARANASI,JAYADEEP S.

## 2013-10-01 NOTE — Progress Notes (Signed)
SUBJECTIVE:  No chest pain. Cath findings c/w Takatsubo CM.  Wants to go home.  OBJECTIVE:   Vitals:   Filed Vitals:   10/01/13 0007 10/01/13 0015 10/01/13 0354 10/01/13 0814  BP: 90/28 103/30 105/48 102/57  Pulse: 66  67 70  Temp: 98.2 F (36.8 C)  98.1 F (36.7 C) 98.7 F (37.1 C)  TempSrc: Oral  Oral Oral  Resp: 19 24 19 15   Height:      Weight:      SpO2: 97%  95% 97%   I&O's:    Intake/Output Summary (Last 24 hours) at 10/01/13 1059 Last data filed at 10/01/13 0800  Gross per 24 hour  Intake 1494.83 ml  Output   1375 ml  Net 119.83 ml   TELEMETRY: Reviewed telemetry pt in NSR, :     PHYSICAL EXAM General: Well developed, well nourished, in no acute distress Head:   Normal cephalic and atramatic  Lungs:   Clear bilaterally to auscultation. Heart:   HRRR S1 S2  No JVD.   Abdomen: abdomen soft and non-tender Msk:  Back normal,  Normal strength and tone for age. Extremities:   No edema.  2+ right radial pulse; right groin stable-no hematoma Neuro: Alert and oriented. Psych:  Normal affect, responds appropriately   LABS: Basic Metabolic Panel:  Recent Labs  09/28/13 1326 09/28/13 1640 09/28/13 1819 09/29/13 0245  NA 128*  --   --  127*  K 4.5  --   --  4.4  CL 91*  --   --  92*  CO2 23  --   --  22  GLUCOSE 111*  --   --  113*  BUN 19  --   --  18  CREATININE 0.92  --   --  0.95  CALCIUM 11.3* 10.2  --  10.2  MG  --   --  2.2 1.9  PHOS  --  2.2*  --   --    Liver Function Tests:  Recent Labs  09/28/13 1326 09/29/13 0245  AST 24 33  ALT 16 15  ALKPHOS 93 81  BILITOT 0.6 0.8  PROT 7.5 6.2  ALBUMIN 4.1 3.2*   No results found for this basename: LIPASE, AMYLASE,  in the last 72 hours CBC:  Recent Labs  09/28/13 1326  09/30/13 0333 10/01/13 0317  WBC 7.7  < > 9.9 7.9  NEUTROABS 4.7  --   --   --   HGB 13.0  < > 10.5* 9.9*  HCT 36.8  < > 29.3* 27.9*  MCV 88.0  < > 86.9 88.3  PLT 314  < > 293 273  < > = values in this interval  not displayed. Cardiac Enzymes:  Recent Labs  09/28/13 1819 09/28/13 2235 09/29/13 0245  TROPONINI 9.54* 5.67* 3.19*   BNP: No components found with this basename: POCBNP,  D-Dimer: No results found for this basename: DDIMER,  in the last 72 hours Hemoglobin A1C:  Recent Labs  09/28/13 1819  HGBA1C 6.0*   Fasting Lipid Panel:  Recent Labs  09/29/13 0245  CHOL 170  HDL 32*  LDLCALC 110*  TRIG 139  CHOLHDL 5.3   Thyroid Function Tests:  Recent Labs  09/29/13 0245  TSH 2.870   Anemia Panel: No results found for this basename: VITAMINB12, FOLATE, FERRITIN, TIBC, IRON, RETICCTPCT,  in the last 72 hours Coag Panel:   Lab Results  Component Value Date   INR 1.07 09/28/2013  RADIOLOGY: Dg Chest 2 View  09/28/2013   CLINICAL DATA:  Chest pain  EXAM: CHEST  2 VIEW  COMPARISON:  04/28/2013  FINDINGS: Normal heart size. Lungs are under aerated with bibasilar atelectasis. No pleural effusion or pneumothorax.  IMPRESSION: Bibasilar atelectasis.   Electronically Signed   By: Maryclare Bean M.D.   On: 09/28/2013 14:07      ASSESSMENT:  Takotsubo cardiomyopathy  PLAN:  Continue medical therapy for LV dysfunction.  Not much room to increase meds.  BP controlled.    PT consult as patient feels weak.  She may need HHPT.  Went over instructions to avoid groin bleeding at home.  Consider d/c later today based on PT findings.    Jettie Booze., MD  10/01/2013  10:59 AM

## 2013-10-01 NOTE — Discharge Instructions (Addendum)
Information on my medicine - ELIQUIS (apixaban)  This medication education was reviewed with me or my healthcare representative as part of my discharge preparation.  The pharmacist that spoke with me during my hospital stay was:  Georgina Peer, [facility]  Why was Eliquis prescribed for you? Eliquis was prescribed for you to reduce the risk of a blood clot forming that can cause a stroke if you have a medical condition called atrial fibrillation (a type of irregular heartbeat).  What do You need to know about Eliquis ? Take your Eliquis TWICE DAILY - one tablet in the morning and one tablet in the evening with or without food. If you have difficulty swallowing the tablet whole please discuss with your pharmacist how to take the medication safely.  Take Eliquis exactly as prescribed by your doctor and DO NOT stop taking Eliquis without talking to the doctor who prescribed the medication.  Stopping may increase your risk of developing a stroke.  Refill your prescription before you run out.  After discharge, you should have regular check-up appointments with your healthcare provider that is prescribing your Eliquis.  In the future your dose may need to be changed if your kidney function or weight changes by a significant amount or as you get older.  What do you do if you miss a dose? If you miss a dose, take it as soon as you remember on the same day and resume taking twice daily.  Do not take more than one dose of ELIQUIS at the same time to make up a missed dose.  Important Safety Information A possible side effect of Eliquis is bleeding. You should call your healthcare provider right away if you experience any of the following:   Bleeding from an injury or your nose that does not stop.   Unusual colored urine (red or dark brown) or unusual colored stools (red or black).   Unusual bruising for unknown reasons.   A serious fall or if you hit your head (even if there is no  bleeding).  Some medicines may interact with Eliquis and might increase your risk of bleeding or clotting while on Eliquis. To help avoid this, consult your healthcare provider or pharmacist prior to using any new prescription or non-prescription medications, including herbals, vitamins, non-steroidal anti-inflammatory drugs (NSAIDs) and supplements.  This website has more information on Eliquis (apixaban): [email or web address].no.  Cardiomyopathy Cardiomyopathy means a disease of the heart muscle. The heart muscle becomes enlarged or stiff. The heart is not able to pump enough blood or deliver enough oxygen to the body. This leads to heart failure and is the number one reason for heart transplants.  TYPES OF CARDIOMYOPATHY INCLUDE: DILATED  The most common type. The heart muscle is stretched out and weak so there is less blood pumped out.   Some causes:  Disease of the arteries of the heart (ischemia).  Heart attack with muscle scar.  Leaky or damaged valves.  After a viral illness.  Smoking.  High cholesterol.  Diabetes or overactive thyroid.  Alcohol or drug abuse.  High blood pressure.  May be reversible. HYPERTROPHIC The heart muscle grows bigger so there is less room for blood in the ventricle, and not enough blood is pumped out.   Causes include:  Mitral valve leaks.  Inherited tendency (from your family).  No explanation (idiopathic).  May be a cause of sudden death in young athletes with no symptoms. RESTRICTIVE The heart muscle becomes stiff, but not always larger. The heart  has to work harder and will get weaker. Abnormal heart beats or rhythm (arrhythmia) are common.  Some causes:  Diseases in other parts of the body which may produce abnormal deposits in the heart muscle.  Probably not inherited.  A result of radiation treatment for cancer. SYMPTOMS OF ALL TYPES:  Less able to exercise or tolerate physical activity.  Palpitations.  Irregular heart  beat, heart arrhythmias.  Shortness of breath, even at rest.  Chest pain.  Lightheadedness or fainting. TREATMENT  Life-style changes including reducing salt, lowering cholesterol, stop smoking.  Manage contributing causes with medications.  Medicines to help reduce the fluids in the body.  An implanted cardioverter defibrillator (ICD) to improve heart function and correct arrhythmias.  Medications to relax the blood vessels and make it easier for the heart to pump.  Drugs that help regulate heart beat and improve heart relaxation, reducing the work of the heart.  Myomectomy for patients with hypertrophic cardiomyopathy and severe problems. This is a surgical procedure that removes a portion of the thickened muscle wall in order to improve heart output and provide symptom relief.  A heart transplant is an option in carefully applied circumstances. SEEK IMMEDIATE MEDICAL CARE IF:   You have severe chest pain, especially if the pain is crushing or pressure-like and spreads to the arms, back, neck, or jaw, or if you have sweating, feeling sick to your stomach (nausea), or shortness of breath. THIS IS AN EMERGENCY. Do not wait to see if the pain will go away. Get medical help at once. Call your local emergency services (911 in [place] DO NOT drive yourself to the hospital.  You develop severe shortness of breath.  You begin to cough up bloody sputum.  You are unable to sleep because you cannot breathe.  You gain weight due to fluid retention.  You develop painful swelling in your calf or leg.  You feel your heart racing and it does not go away or happens when you are resting. Document Released: 06/02/2004 Document Revised: 06/12/2011 Document Reviewed: 11/06/2007 [facility] Patient Information 2015 [place], [place]. This information is not intended to replace advice given to you by your health care provider. Make sure you discuss any questions you have with your health care  provider.  No driving for 24 hours. No lifting over 5 lbs for 1 week. No sexual activity for 1 week. Keep procedure site clean & dry. If you notice increased pain, swelling, bleeding or pus, call/return!  You may shower, but no soaking baths/hot tubs/pools for 1 week.

## 2013-10-01 NOTE — Evaluation (Signed)
Physical Therapy Evaluation/ Discharge Patient Details Name: Kelly Boone MRN: [id number] DOB: 1931/06/18 Today's Date: 10/01/2013   History of Present Illness  Pt admitted with chest pain and found to have takotsubo syndrome via cath 6/30  Clinical Impression  Pt very pleasant and mobilizing well. Pt states she feels stressed with family situation and having to assist spouse at times with mobility. Recommended pt look into aide for spouse as needed and to discuss with MD. Pt with difficulty standing from low chair and balance deficits noted with gait which would benefit from OPPT but pt states she will defer at this time and just work on walking throughout the day. Pt educated for ease of transfers from sit to stand, RW use to improve gait and balance and recommendation for ambulation as well as OPPT. Pt reports feeling she is back to baseline other than a little SOB after gait with sats 93% onRA. No further acute needs at this time as pt is being followed by cardiac rehab and recommend daily ambulation. Will sign off with pt and family (spouse and son) in agreement.    Follow Up Recommendations Outpatient PT    Equipment Recommendations  None recommended by PT    Recommendations for Other Services       Precautions / Restrictions Precautions Precautions: Fall      Mobility  Bed Mobility Overal bed mobility: Modified Independent                Transfers Overall transfer level: Needs assistance   Transfers: Sit to/from Stand Sit to Stand: Supervision         General transfer comment: cues for sequence and scooting to edge first, pt requiring momentum and 3 attempts before able to clear chair  Ambulation/Gait Ambulation/Gait assistance: Supervision Ambulation Distance (Feet): 175 Feet Assistive device: None Gait Pattern/deviations: Step-through pattern;Decreased stride length   Gait velocity interpretation: Below normal speed for age/gender General Gait Details:  pt with slight limp to her gait, does not report pain and states gait is baseline. Educated for benefit of RW to normalize gait and assist with fall prevention  Stairs Stairs: Yes Stairs assistance: Modified independent (Device/Increase time) Stair Management: One rail Right;Step to pattern;Forwards Number of Stairs: 4 General stair comments: pt able to complete 2 steps without rail  Wheelchair Mobility    Modified Rankin (Stroke Patients Only)       Balance Overall balance assessment: Needs assistance   Sitting balance-Leahy Scale: Good       Standing balance-Leahy Scale: Good                 High Level Balance Comments: pt with slow gait and unable to complete change of speed or head turns             Pertinent Vitals/Pain No pain HR 94 with gait sats 93% on RA    Home Living Family/patient expects to be discharged to:: Private residence Living Arrangements: Spouse/significant other Available Help at Discharge: Family Type of Home: House Home Access: Stairs to enter Entrance Stairs-Rails: None Entrance Stairs-Number of Steps: 2 Home Layout: One level Home Equipment: Environmental consultant - 2 wheels;Cane - single point Additional Comments: comfort height toilet    Prior Function Level of Independence: Independent         Comments: pt takes care of spouse who recently had a CVA and she assists with his transfers at times although spouse ambulatory     Hand Dominance  Extremity/Trunk Assessment   Upper Extremity Assessment: Generalized weakness           Lower Extremity Assessment: Generalized weakness      Cervical / Trunk Assessment: Normal  Communication   Communication: No difficulties  Cognition Arousal/Alertness: Awake/alert Behavior During Therapy: WFL for tasks assessed/performed Overall Cognitive Status: Within Functional Limits for tasks assessed                      General Comments      Exercises         Assessment/Plan    PT Assessment All further PT needs can be met in the next venue of care  PT Diagnosis Difficulty walking;Generalized weakness   PT Problem List Decreased strength;Decreased balance;Decreased activity tolerance  PT Treatment Interventions     PT Goals (Current goals can be found in the Care Plan section) Acute Rehab PT Goals PT Goal Formulation: No goals set, d/c therapy    Frequency     Barriers to discharge        Co-evaluation               End of Session   Activity Tolerance: Patient tolerated treatment well Patient left: in bed;with call bell/phone within reach;with family/visitor present Nurse Communication: Mobility status         Time: 1483-0735 PT Time Calculation (min): 12 min   Charges:   PT Evaluation $Initial PT Evaluation Tier I: 1 Procedure PT Treatments $Therapeutic Activity: 8-22 mins   PT G CodesMelford Aase 10/01/2013, 2:26 PM Elwyn Reach, [place]

## 2013-10-01 NOTE — Progress Notes (Addendum)
CARDIAC REHAB PHASE I   PRE:  Rate/Rhythm: 74 SR    BP: sitting 103/52    SaO2:   MODE:  Ambulation: 170 ft   POST:  Rate/Rhythm: 94 SR    BP: sitting 119/46     SaO2:   Pt c/o weakness this am. She feels that its from cath lab drugs and also no activity for days. Slightly unsteady walking. C/o weak legs. Did not want to go far. To recliner. Ed completed with daughter present. Instructed pt not to pull on husband any more and make sure she has stress relief. Discussed low sodium and more ex. Pt to sit in recliner and walk more today.   [id number]  Kelly Boone [place] CES, ACSM 10/01/2013 10:36 AM  Spoke with pt by phone and she is interested in [facility] in [place]. Will send referral. Yves Dill CES, ACSM 2:48 PM 10/01/2013

## 2013-10-09 ENCOUNTER — Encounter: Payer: Self-pay | Admitting: Cardiovascular Disease

## 2013-11-03 ENCOUNTER — Other Ambulatory Visit (HOSPITAL_COMMUNITY): Payer: Self-pay | Admitting: Physician Assistant

## 2013-11-03 ENCOUNTER — Ambulatory Visit (HOSPITAL_COMMUNITY): Payer: Medicare Other | Attending: Cardiology | Admitting: Radiology

## 2013-11-03 DIAGNOSIS — I5181 Takotsubo syndrome: Secondary | ICD-10-CM | POA: Insufficient documentation

## 2013-11-03 DIAGNOSIS — I219 Acute myocardial infarction, unspecified: Secondary | ICD-10-CM | POA: Diagnosis not present

## 2013-11-03 DIAGNOSIS — I498 Other specified cardiac arrhythmias: Secondary | ICD-10-CM | POA: Insufficient documentation

## 2013-11-03 DIAGNOSIS — I422 Other hypertrophic cardiomyopathy: Secondary | ICD-10-CM

## 2013-11-03 DIAGNOSIS — I4891 Unspecified atrial fibrillation: Secondary | ICD-10-CM | POA: Diagnosis not present

## 2013-11-03 NOTE — Progress Notes (Signed)
Echocardiogram performed.  

## 2013-11-13 ENCOUNTER — Telehealth: Payer: Self-pay | Admitting: Internal Medicine

## 2013-11-13 MED ORDER — LOSARTAN POTASSIUM-HCTZ 100-12.5 MG PO TABS: ORAL_TABLET | ORAL | Status: DC

## 2013-11-13 NOTE — Addendum Note (Signed)
Addended by: Carollee Sires L on: 11/13/2013 11:17 AM   Modules accepted: Orders

## 2013-11-13 NOTE — Telephone Encounter (Signed)
Pt called because since June this year she had two episode of low BP that she almost past out. Pt takes Hyzaar 100-12.5 mg in AM and Coreg 3.125 mg, her BP was 123/53 HR 58 beats/minute. Last night pt took her PM coreg 3.125 mg , and ELIQUIS AT SUPPER TIME at about 7:00 Pm her BP was 83/43 she fell like she was going to past out, she was sick at her stomach and running bowel at 8:00 PM her BP was 107/54 pulse 53 beats/minute. Dr. Lovena Le DOD aware and, recommends for pt to cut the Hyzaar pill in half and take 1/2 tablet once daily. Pt is aware. She verbalized understanding.

## 2013-11-13 NOTE — Telephone Encounter (Signed)
New message  Pt called requests a call back to discuss medication dosages and how it effects her blood pressure. She believes that it is causing her BP to drop and that she is seldom feels as if she will pass out( No readings provided) pt requests a call back to discuss.

## 2013-12-30 ENCOUNTER — Encounter: Payer: Self-pay | Admitting: Internal Medicine

## 2013-12-30 ENCOUNTER — Ambulatory Visit (INDEPENDENT_AMBULATORY_CARE_PROVIDER_SITE_OTHER): Payer: Medicare Other | Admitting: Internal Medicine

## 2013-12-30 VITALS — BP 184/74 | HR 60 | Ht 62.0 in | Wt 150.6 lb

## 2013-12-30 DIAGNOSIS — I4891 Unspecified atrial fibrillation: Secondary | ICD-10-CM

## 2013-12-30 DIAGNOSIS — I498 Other specified cardiac arrhythmias: Secondary | ICD-10-CM

## 2013-12-30 DIAGNOSIS — I480 Paroxysmal atrial fibrillation: Secondary | ICD-10-CM

## 2013-12-30 DIAGNOSIS — R001 Bradycardia, unspecified: Secondary | ICD-10-CM

## 2013-12-30 LAB — BASIC METABOLIC PANEL
BUN: 22 mg/dL (ref 6–23)
CO2: 26 mEq/L (ref 19–32)
Calcium: 11.1 mg/dL — ABNORMAL HIGH (ref 8.4–10.5)
Chloride: 101 mEq/L (ref 96–112)
Creatinine, Ser: 1 mg/dL (ref 0.4–1.2)
GFR: 56.72 mL/min — ABNORMAL LOW (ref >60.00)
Glucose, Bld: 95 mg/dL (ref 70–99)
Potassium: 4.7 mEq/L (ref 3.5–5.1)
Sodium: 131 mEq/L — ABNORMAL LOW (ref 135–145)

## 2013-12-30 MED ORDER — LOSARTAN POTASSIUM-HCTZ 50-12.5 MG PO TABS: 1.0000 | ORAL_TABLET | Freq: Every day | ORAL | Status: DC

## 2013-12-30 MED ORDER — FUROSEMIDE 20 MG PO TABS: 20.0000 mg | ORAL_TABLET | Freq: Every day | ORAL | Status: DC | PRN

## 2013-12-30 NOTE — Patient Instructions (Signed)
Your physician wants you to follow-up in: 6 months with Dr. Caryl Comes. You will receive a reminder letter in the mail two months in advance. If you don't receive a letter, please call our office to schedule the follow-up appointment.  Your physician has recommended you make the following change in your medication:  1) DECREASE Hyzaar to one 50/12.5 tablet by mouth daily (this will be a NEW pill, do not take your old prescription) 2) START TAKING Furosemide 20 mg AS NEEDED once daily for edema. If you find yourself taking daily, call the office.   Your physician recommends that you have lab work TODAY (BMET).

## 2013-12-30 NOTE — Progress Notes (Signed)
Patient Care Team: Charletta Cousin, MD as PCP - General (Family Medicine)   HPI  Kelly Boone is a 80 y.o. female Seen with recent diagnosis of atrial fibrillation for which she was started on Rivaroxaban coreg, losaran and Mag and aldactone.  She apparently underwent Myoview scanning and echocardiography which were unrevealing  reports were reviewed. Interestingly her left atrial dimension was normal.  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   She has hx of hypertension diabetes.   At the last visit we discussed the use of anticoagulation and  NOACs. She ended up on apixaban; she stopped her aspirin on her own  Problems with intermittent lightheadedness prompting her to down titrate her antihypertensives. Blood pressures have been running 120-140 at home.   She expressed her dismay at the cost of medications particularly her anti-diabetic regime  She does not snore.  She has not had problems with exercise intolerance.  Her biggest limitation is seen in her legs.    Past Medical History  Diagnosis Date  . A-fib     irregular heartbeat  . DM (diabetes mellitus) 2004  . Hypertension 1997  . Hyperlipemia 1997  . Allergic rhinitis   . Anxiety   . Asthma   . Atopic dermatitis   . Benign essential hypertension   . Edema   . Special screening for malignant neoplasms, colon   . Esophagitis   . Headache   . Hearing loss   . Encounter for long-term (current) use of other medications   . Hypercalcemia   . Sinus bradycardia   . Disorders of magnesium metabolism   . Hyposmolality and/or hyponatremia   . Pain in joint, lower leg   . Myalgia and myositis, unspecified   . Need for prophylactic vaccination and inoculation against influenza   . Other syndromes affecting cervical region   . Osteoporosis, unspecified   . Pain in joint, shoulder region   . Restless legs syndrome  (RLS)   . Screening for depression   . Varicose veins of lower extremities with inflammation   . Unspecified essential hypertension   . Peripheral autonomic neuropathy in disorders classified elsewhere(337.1)   . Type 2 diabetes mellitus with autonomic neuropathy   . Urinary tract infection, site not specified   . Other screening mammogram   . Unspecified vitamin D deficiency     Past Surgical History  Procedure Laterality Date  . Appendectomy    . Cholecystectomy    . Abdominal hysterectomy    . Shoulder surgery      Repair  . Tonsillectomy    . Other surgical history      Nuclear Stress Test with EF 70%, Chest Radiograph-lungs are clear without pleural effusions or focal consolidations. Trachea projects midline and there is no pneumothorax. Suture anchors in right humeral head.    Current Outpatient Prescriptions  Medication Sig Dispense Refill  . albuterol (PROVENTIL HFA;VENTOLIN HFA) 108 (90 BASE) MCG/ACT inhaler Inhale 2 puffs into the lungs every 6 (six) hours as needed for wheezing or shortness of breath.      Marland Kitchen apixaban (ELIQUIS) 5 MG TABS tablet Take 5 mg by mouth 2 (two) times daily.       Marland Kitchen BIOTIN PO Take 1,000 mg by mouth daily.       . carvedilol (COREG) 3.125 MG tablet Take 3.125 mg by mouth 2 (two) times daily with a meal.      .  Cholecalciferol (VITAMIN D PO) Take 1,000 Units by mouth daily.      . diazepam (VALIUM) 5 MG tablet Take 5 mg by mouth every 6 (six) hours as needed for anxiety.      Marland Kitchen GRAPE SEED EXTRACT PO Take 100 mg by mouth daily.       Marland Kitchen losartan-hydrochlorothiazide (HYZAAR) 100-12.5 MG per tablet Take 1/2 tablet by mouth daily.  30 tablet  6  . Magnesium 250 MG TABS 2 tabs po qd      . OVER THE COUNTER MEDICATION Take by mouth daily. 1 tsp for restless leg syndrome      . rOPINIRole (REQUIP) 1 MG tablet Take 2 mg by mouth at bedtime.       . vitamin B-12 (CYANOCOBALAMIN) 1000 MCG tablet Take 500 mcg by mouth daily.        No current  facility-administered medications for this visit.    Allergies  Allergen Reactions  . Daypro [Oxaprozin] Other (See Comments)    Palpitation  . Statins Other (See Comments)    Myalgias  . Zetia [Ezetimibe] Other (See Comments)    Joint Pain, Swelling    Review of Systems negative except from HPI and PMH  Physical Exam BP 184/74  Pulse 60  Ht 5\' 2"  (1.575 m)  Wt 150 lb 9.6 oz (68.312 kg)  BMI 27.54 kg/m2 Alert and oriented in no acute distress HENT- normal Eyes- EOMI, without scleral icterus Skin- warm and dry; without rashes LN-neg Neck- supple without thyromegaly, JVP-flat, carotids brisk and full without bruits Back-without CVAT or kyphosis Lungs-clear to auscultation CV-Regular rate and rhythm, nl S1 and S2, no murmurs gallops or rubs, S4-absent Abd-soft with active bowel sounds; no midline pulsation or hepatomegaly Pulses-intact femoral and distal MKS-without gross deformity Neuro- Ax O, CN3-12 intact, grossly normal motor and sensory function Affect engaging Ext2+ edema    Assessment and  Plan  Atrial fibrillation  Sinus bradycardia  Hypertension  Diabetes   Infrequent paroxysms of atrial fibrillation are associated with a rapid ventricular rate. She also has resting sinus bradycardia. Hence, we will have difficulty using chronic medications to mitigate the rapid rates with her paroxysms of atrial fibrillation. Thankfully however her episodes have occurred only once every couple of years and so we will use when necessary beta blockers to be taken at the time of an event.  She is on apixoban   We will give her a prescription for low-dose furosemide to take on an as-needed basis. We'll also change her losartan combination from 100/12.5 (one half pill daily)  To 50/12.5. This will increase her baseline diuretic dose.  We'll check a metabolic profile

## 2014-03-12 ENCOUNTER — Encounter (HOSPITAL_COMMUNITY): Payer: Self-pay | Admitting: Cardiovascular Disease

## 2014-04-03 DIAGNOSIS — Z23 Encounter for immunization: Secondary | ICD-10-CM | POA: Diagnosis not present

## 2014-04-17 ENCOUNTER — Emergency Department (HOSPITAL_COMMUNITY)
Admission: EM | Admit: 2014-04-17 | Discharge: 2014-04-18 | Disposition: A | Discharge status: Home / Self Care | Payer: Medicare Other | Attending: Emergency Medicine | Admitting: Emergency Medicine

## 2014-04-17 ENCOUNTER — Encounter (HOSPITAL_COMMUNITY): Payer: Self-pay | Admitting: Emergency Medicine

## 2014-04-17 ENCOUNTER — Emergency Department (HOSPITAL_COMMUNITY): Payer: Medicare Other

## 2014-04-17 DIAGNOSIS — R0789 Other chest pain: Secondary | ICD-10-CM | POA: Diagnosis not present

## 2014-04-17 DIAGNOSIS — R079 Chest pain, unspecified: Secondary | ICD-10-CM | POA: Diagnosis present

## 2014-04-17 DIAGNOSIS — I480 Paroxysmal atrial fibrillation: Secondary | ICD-10-CM | POA: Insufficient documentation

## 2014-04-17 DIAGNOSIS — Z87440 Personal history of urinary (tract) infections: Secondary | ICD-10-CM | POA: Insufficient documentation

## 2014-04-17 DIAGNOSIS — I10 Essential (primary) hypertension: Secondary | ICD-10-CM | POA: Diagnosis not present

## 2014-04-17 DIAGNOSIS — Z872 Personal history of diseases of the skin and subcutaneous tissue: Secondary | ICD-10-CM | POA: Insufficient documentation

## 2014-04-17 DIAGNOSIS — E1143 Type 2 diabetes mellitus with diabetic autonomic (poly)neuropathy: Secondary | ICD-10-CM | POA: Insufficient documentation

## 2014-04-17 DIAGNOSIS — G2581 Restless legs syndrome: Secondary | ICD-10-CM | POA: Diagnosis not present

## 2014-04-17 DIAGNOSIS — E871 Hypo-osmolality and hyponatremia: Secondary | ICD-10-CM | POA: Insufficient documentation

## 2014-04-17 DIAGNOSIS — Z8739 Personal history of other diseases of the musculoskeletal system and connective tissue: Secondary | ICD-10-CM | POA: Insufficient documentation

## 2014-04-17 DIAGNOSIS — I4891 Unspecified atrial fibrillation: Secondary | ICD-10-CM

## 2014-04-17 DIAGNOSIS — Z8719 Personal history of other diseases of the digestive system: Secondary | ICD-10-CM | POA: Diagnosis not present

## 2014-04-17 DIAGNOSIS — Z79899 Other long term (current) drug therapy: Secondary | ICD-10-CM | POA: Insufficient documentation

## 2014-04-17 DIAGNOSIS — J45909 Unspecified asthma, uncomplicated: Secondary | ICD-10-CM | POA: Diagnosis not present

## 2014-04-17 DIAGNOSIS — R1110 Vomiting, unspecified: Secondary | ICD-10-CM | POA: Diagnosis not present

## 2014-04-17 DIAGNOSIS — F419 Anxiety disorder, unspecified: Secondary | ICD-10-CM | POA: Diagnosis not present

## 2014-04-17 DIAGNOSIS — Z9889 Other specified postprocedural states: Secondary | ICD-10-CM | POA: Diagnosis not present

## 2014-04-17 LAB — PROTIME-INR
INR: 1.4 (ref 0.00–1.49)
Prothrombin Time: 17.3 seconds — ABNORMAL HIGH (ref 11.6–15.2)

## 2014-04-17 LAB — CBC
HCT: 33.6 % — ABNORMAL LOW (ref 36.0–46.0)
Hemoglobin: 11.8 g/dL — ABNORMAL LOW (ref 12.0–15.0)
MCH: 31.6 pg (ref 26.0–34.0)
MCHC: 35.1 g/dL (ref 30.0–36.0)
MCV: 89.8 fL (ref 78.0–100.0)
Platelets: 226 10*3/uL (ref 150–400)
RBC: 3.74 MIL/uL — ABNORMAL LOW (ref 3.87–5.11)
RDW: 12.6 % (ref 11.5–15.5)
WBC: 6.4 10*3/uL (ref 4.0–10.5)

## 2014-04-17 LAB — BASIC METABOLIC PANEL
Anion gap: 7 (ref 5–15)
BUN: 20 mg/dL (ref 6–23)
CO2: 23 mmol/L (ref 19–32)
Calcium: 10.8 mg/dL — ABNORMAL HIGH (ref 8.4–10.5)
Chloride: 99 mEq/L (ref 96–112)
Creatinine, Ser: 1 mg/dL (ref 0.50–1.10)
GFR calc Af Amer: 60 mL/min — ABNORMAL LOW (ref >90)
GFR calc non Af Amer: 52 mL/min — ABNORMAL LOW (ref >90)
Glucose, Bld: 127 mg/dL — ABNORMAL HIGH (ref 70–99)
Potassium: 3.8 mmol/L (ref 3.5–5.1)
Sodium: 129 mmol/L — ABNORMAL LOW (ref 135–145)

## 2014-04-17 LAB — I-STAT TROPONIN, ED: Troponin i, poc: 0 ng/mL (ref 0.00–0.08)

## 2014-04-17 LAB — BRAIN NATRIURETIC PEPTIDE: B Natriuretic Peptide: 96.4 pg/mL (ref 0.0–100.0)

## 2014-04-17 MED ORDER — DILTIAZEM HCL 25 MG/5ML IV SOLN
15.0000 mg | Freq: Once | INTRAVENOUS | Status: AC
  Administered 2014-04-17: 15 mg via INTRAVENOUS
  Filled 2014-04-17: qty 5

## 2014-04-17 NOTE — ED Provider Notes (Signed)
CSN: [id number]     Arrival date & time 04/17/14  2121 History   First MD Initiated Contact with Patient 04/17/14 2139     Chief Complaint  Patient presents with  . Atrial Fibrillation  . Chest Pain   (Consider location/radiation/quality/duration/timing/severity/associated sxs/prior Treatment) HPI Comments: Patient with h/o paroxysmal atrial fibrillation on Eliquis, Takosubo cardiomyopathy 09/2013 -- presents with palpitations and feeling like her heart is out of rhythm since 7 PM tonight. Patient was in her usual state of health prior without any complaints. Patient has not had any chest pain (states to me, contradicting triage note), shortness of breath, lightheadedness or syncope. No worsening lower extremity edema. No recent illness including fever, URI symptoms, cough, urinary tract infection symptoms. No treatments prior to arrival. Patient is on carvedilol twice a day.  The history is provided by the patient and medical records.    Past Medical History  Diagnosis Date  . A-fib     irregular heartbeat  . DM (diabetes mellitus) 2004  . Hypertension 1997  . Hyperlipemia 1997  . Allergic rhinitis   . Anxiety   . Asthma   . Atopic dermatitis   . Benign essential hypertension   . Edema   . Special screening for malignant neoplasms, colon   . Esophagitis   . Headache   . Hearing loss   . Encounter for long-term (current) use of other medications   . Hypercalcemia   . Sinus bradycardia   . Disorders of magnesium metabolism   . Hyposmolality and/or hyponatremia   . Pain in joint, lower leg   . Myalgia and myositis, unspecified   . Need for prophylactic vaccination and inoculation against influenza   . Other syndromes affecting cervical region   . Osteoporosis, unspecified   . Pain in joint, shoulder region   . Restless legs syndrome (RLS)   . Screening for depression   . Varicose veins of lower extremities with inflammation   . Unspecified essential hypertension   .  Peripheral autonomic neuropathy in disorders classified elsewhere(337.1)   . Type 2 diabetes mellitus with autonomic neuropathy   . Urinary tract infection, site not specified   . Other screening mammogram   . Unspecified vitamin D deficiency    Past Surgical History  Procedure Laterality Date  . Appendectomy    . Cholecystectomy    . Abdominal hysterectomy    . Shoulder surgery      Repair  . Tonsillectomy    . Other surgical history      Nuclear Stress Test with EF 70%, Chest Radiograph-lungs are clear without pleural effusions or focal consolidations. Trachea projects midline and there is no pneumothorax. Suture anchors in right humeral head.  . Left heart catheterization with coronary angiogram N/A 09/30/2013    Procedure: LEFT HEART CATHETERIZATION WITH CORONARY ANGIOGRAM;  Surgeon: Troy Sine, MD;  [facility] C[facility] LAB;  Service: Cardiovascular;  Laterality: N/A;   Family History  Problem Relation Age of Onset  . Anemia Mother   . CAD Mother   . Hypertension Mother   . CAD Brother    History  Substance Use Topics  . Smoking status: Never Smoker   . Smokeless tobacco: Not on file  . Alcohol Use: No   OB History    No data available     Review of Systems  Constitutional: Negative for fever and diaphoresis.  Eyes: Negative for redness.  Respiratory: Negative for cough and shortness of breath.   Cardiovascular: Positive for palpitations.  Negative for chest pain and leg swelling.  Gastrointestinal: Negative for nausea, vomiting and abdominal pain.  Genitourinary: Negative for dysuria.  Musculoskeletal: Negative for back pain and neck pain.  Skin: Negative for rash.  Neurological: Negative for syncope and light-headedness.   Allergies  Daypro; Statins; and Zetia  Home Medications   Prior to Admission medications   Medication Sig Start Date End Date Taking? Authorizing Provider  albuterol (PROVENTIL HFA;VENTOLIN HFA) 108 (90 BASE) MCG/ACT inhaler Inhale 2  puffs into the lungs every 6 (six) hours as needed for wheezing or shortness of breath.   Yes Historical Provider, MD  apixaban (ELIQUIS) 5 MG TABS tablet Take 5 mg by mouth 2 (two) times daily.    Yes Historical Provider, MD  BIOTIN PO Take 1,000 mg by mouth daily.    Yes Historical Provider, MD  carvedilol (COREG) 3.125 MG tablet Take 3.125 mg by mouth 2 (two) times daily with a meal.   Yes Historical Provider, MD  Cholecalciferol (VITAMIN D PO) Take 1,000 Units by mouth daily.   Yes Historical Provider, MD  diazepam (VALIUM) 5 MG tablet Take 5 mg by mouth every 6 (six) hours as needed for anxiety.   Yes Historical Provider, MD  furosemide (LASIX) 20 MG tablet Take 1 tablet (20 mg total) by mouth daily as needed for edema. 12/30/13  Yes Deboraha Sprang, MD  GRAPE SEED EXTRACT PO Take 100 mg by mouth daily.    Yes Historical Provider, MD  losartan-hydrochlorothiazide (HYZAAR) 50-12.5 MG per tablet Take 1 tablet by mouth daily. 12/30/13  Yes Deboraha Sprang, MD  Magnesium 250 MG TABS 2 tabs po qd   Yes Historical Provider, MD  OVER THE COUNTER MEDICATION Take by mouth daily. 1 tsp for restless leg syndrome   Yes Historical Provider, MD  rOPINIRole (REQUIP) 1 MG tablet Take 2 mg by mouth at bedtime.    Yes Historical Provider, MD  vitamin B-12 (CYANOCOBALAMIN) 1000 MCG tablet Take 500 mcg by mouth daily.    Yes Historical Provider, MD   BP 142/65 mmHg  Pulse 151  Temp(Src) 98.5 F (36.9 C) (Oral)  Resp 25  SpO2 98%   Physical Exam  Constitutional: She appears well-developed and well-nourished.  HENT:  Head: Normocephalic and atraumatic.  Mouth/Throat: Mucous membranes are normal. Mucous membranes are not dry.  Eyes: Conjunctivae are normal.  Neck: Trachea normal and normal range of motion. Neck supple. Normal carotid pulses and no JVD present. No muscular tenderness present. Carotid bruit is not present. No tracheal deviation present.  Cardiovascular: S1 normal, S2 normal, normal heart sounds  and intact distal pulses.  An irregularly irregular rhythm present. Tachycardia present.  Exam reveals no decreased pulses.   No murmur heard. Pulmonary/Chest: Effort normal. No respiratory distress. She has no wheezes. She exhibits no tenderness.  Abdominal: Soft. Normal aorta and bowel sounds are normal. There is no tenderness. There is no rebound and no guarding.  Musculoskeletal: Normal range of motion.  Neurological: She is alert.  Skin: Skin is warm and dry. She is not diaphoretic. No cyanosis. No pallor.  Psychiatric: She has a normal mood and affect.  Nursing note and vitals reviewed.   ED Course  Procedures (including critical care time) Labs Review Labs Reviewed  CBC - Abnormal; Notable for the following:    RBC 3.74 (*)    Hemoglobin 11.8 (*)    HCT 33.6 (*)    All other components within normal limits  BASIC METABOLIC PANEL - Abnormal;  Notable for the following:    Sodium 129 (*)    Glucose, Bld 127 (*)    Calcium 10.8 (*)    GFR calc non Af Amer 52 (*)    GFR calc Af Amer 60 (*)    All other components within normal limits  PROTIME-INR - Abnormal; Notable for the following:    Prothrombin Time 17.3 (*)    All other components within normal limits  BRAIN NATRIURETIC PEPTIDE  I-STAT TROPOININ, ED    Imaging Review Dg Chest Port 1 View  04/17/2014   CLINICAL DATA:  One day history of atrial fibrillation. Current history of diabetes, hypertension and asthma.  EXAM: PORTABLE CHEST - 1 VIEW  COMPARISON:  Two-view chest x-ray 09/28/2013 and 08/10/2005.  FINDINGS: Cardiac silhouette normal in size, unchanged. Lungs clear. Bronchovascular markings normal. Pulmonary vascularity normal. No visible pleural effusions. No pneumothorax.  IMPRESSION: No acute cardiopulmonary disease.   Electronically Signed   By: Evangeline Dakin M.D.   On: 04/17/2014 21:59     EKG Interpretation   Date/Time:  Friday April 17 2014 21:37:46 EST Ventricular Rate:  138 PR Interval:    QRS  Duration: 101 QT Interval:  312 QTC Calculation: 473 R Axis:   89 Text Interpretation:  Atrial fibrillation Ventricular premature complex  Borderline right axis deviation Borderline low voltage, extremity leads ST  depression, probably rate related Afib new Confirmed by Mingo Amber  MD, [place]  ([id number]) on 04/17/2014 9:48:25 PM       9:39 PM Patient seen and examined. Work-up initiated. Medications ordered.   Vital signs reviewed and are as follows: BP 142/65 mmHg  Pulse 151  Temp(Src) 98.5 F (36.9 C) (Oral)  Resp 25  SpO2 98%  9:49 PM Discussed with Dr. Mingo Amber who will see. Will give 15mg  diltiazem.   10:25 PM Patient converted after diltiazem. Labs pending. Will discuss with cardiology.   12:13 AM Spoke with Dr. Eula Fried. Pt cleared for d/c to home. He recommends switching Coreg to Lopressor 12.5mg  bid for better rate control activity.   Pt informed. She is anxious to go home. She will call for appointment next week.   Patient was counseled to return with another episode of palpitations or if she develops chest pain.   They were also told to return if their chest pain gets worse and does not go away with rest, they have an attack of chest pain lasting longer than usual despite rest and treatment with the medications their caregiver has prescribed, if they wake from sleep with chest pain or shortness of breath, if they feel dizzy or faint, if they have chest pain not typical of their usual pain, or if they have any other emergent concerns regarding their health.  The patient verbalized understanding and agreed.    MDM   Final diagnoses:  Atrial fibrillation with rapid ventricular response   Patient with afib with RVR, starting 7pm, quickly converted to NSR with cardizem. Labs show chronic hyponatremia, hypercalcemia. Pt states she missed Coreg today, has refill waiting at pharmacy. Card reccs as above.   No dangerous or life-threatening conditions suspected or identified by  history, physical exam, and by work-up. No indications for hospitalization identified.      Carlisle Cater, PA-C 04/18/14 0018  Evelina Bucy, MD 04/18/14 7510  Evelina Bucy, MD 05/15/14 1536

## 2014-04-17 NOTE — ED Notes (Signed)
Pt arrives from hom with c/o chest pain and afib with RVR, rates between 80 and 150. 20 G IV placed in L AC. 324 Aspirin and three nitro given PTA with minimal improvement

## 2014-04-18 MED ORDER — METOPROLOL TARTRATE 25 MG PO TABS: 12.5000 mg | ORAL_TABLET | Freq: Two times a day (BID) | ORAL | Status: DC

## 2014-04-18 NOTE — Discharge Instructions (Signed)
Please read and follow all provided instructions.  Your diagnoses today include:  1. Atrial fibrillation with rapid ventricular response     Tests performed today include:  An EKG of your heart  A chest x-ray  Cardiac enzymes - a blood test for heart muscle damage  Blood counts and electrolytes  Vital signs. See below for your results today.   Medications prescribed:   Lopressor - medication for heart rate  Take any prescribed medications only as directed.  Follow-up instructions: Please follow-up with your primary care provider as soon as you can for further evaluation of your symptoms.   Return instructions:  SEEK IMMEDIATE MEDICAL ATTENTION IF:  You have severe chest pain, especially if the pain is crushing or pressure-like and spreads to the arms, back, neck, or jaw, or if you have sweating, nausea (feeling sick to your stomach), or shortness of breath. THIS IS AN EMERGENCY. Don't wait to see if the pain will go away. Get medical help at once. Call 911 or [phone] (operator). DO NOT drive yourself to the hospital.   Your chest pain gets worse and does not go away with rest.   You have an attack of chest pain lasting longer than usual, despite rest and treatment with the medications your caregiver has prescribed.   You wake from sleep with chest pain or shortness of breath.  You feel dizzy or faint.  You have chest pain not typical of your usual pain for which you originally saw your caregiver.   You have any other emergent concerns regarding your health.  Your vital signs today were: BP 127/50 mmHg   Pulse 55   Temp(Src) 98.5 F (36.9 C) (Oral)   Resp 20   SpO2 100% If your blood pressure (BP) was elevated above 135/85 this visit, please have this repeated by your doctor within one month. --------------

## 2014-04-20 DIAGNOSIS — M1712 Unilateral primary osteoarthritis, left knee: Secondary | ICD-10-CM | POA: Diagnosis not present

## 2014-04-24 ENCOUNTER — Encounter: Payer: Medicare Other | Admitting: Internal Medicine

## 2014-04-29 ENCOUNTER — Encounter: Payer: Self-pay | Admitting: Internal Medicine

## 2014-04-29 ENCOUNTER — Ambulatory Visit (INDEPENDENT_AMBULATORY_CARE_PROVIDER_SITE_OTHER): Payer: Medicare Other | Admitting: Internal Medicine

## 2014-04-29 VITALS — BP 174/73 | HR 56 | Ht 61.0 in | Wt 143.0 lb

## 2014-04-29 DIAGNOSIS — I480 Paroxysmal atrial fibrillation: Secondary | ICD-10-CM

## 2014-04-29 DIAGNOSIS — I10 Essential (primary) hypertension: Secondary | ICD-10-CM

## 2014-04-29 MED ORDER — LOSARTAN POTASSIUM-HCTZ 50-12.5 MG PO TABS: 2.0000 | ORAL_TABLET | Freq: Every day | ORAL | Status: DC

## 2014-04-29 MED ORDER — AMLODIPINE BESYLATE 2.5 MG PO TABS: 2.5000 mg | ORAL_TABLET | Freq: Every day | ORAL | Status: DC

## 2014-04-29 MED ORDER — CLONIDINE HCL 0.1 MG PO TABS
0.1000 mg | ORAL_TABLET | Freq: Once | ORAL | Status: AC
  Administered 2014-04-29: 0.1 mg via ORAL

## 2014-04-29 NOTE — Patient Instructions (Addendum)
Your physician has recommended you make the following change in your medication:  1) INCREASE Hyzaar to 2 tablets daily 2) START Amlodipine 2.5 mg daily  Follow up with your PCP next week about blood pressure  Keep scheduled follow up in March with Dr. Caryl Comes.

## 2014-04-29 NOTE — Progress Notes (Signed)
Okay could remember      Patient Care Team: Tamsen Roers, MD as PCP - General (Family Medicine)   HPI  Kelly Boone is a 81 y.o. female Seen with recent diagnosis of atrial fibrillation for which she was started on Rivaroxaban coreg, losaran and Mag and aldactone.  She apparently underwent Myoview scanning and echocardiography which were unrevealing  reports were reviewed. Interestingly her left atrial dimension was normal.  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  She was seen in the emergency room last week with recurrent rapid atrial fibrillation. Symptoms were primarily related to palpitations. There was no lightheadedness chest pain shortness of breath or dizziness. It terminated about 6 hours later concurrent with the introduction of IV diltiazem. She has noted a cold sensation. There've been a tendency towards bruising on apixaban.     She has hx of hypertension diabetes.   At the last visit we discussed the use of anticoagulation and  NOACs. She ended up on apixaban; she stopped her aspirin on her own  Problems with intermittent lightheadedness prompting her to down titrate her antihypertensives. Blood pressures have been running 130-150 at home.   She expressed her dismay at the cost of medications particularly her anti-diabetic regime  She does not snore.  She has not had problems with exercise intolerance.  Her biggest limitation is seen in her legs.    Past Medical History  Diagnosis Date  . A-fib     irregular heartbeat  . DM (diabetes mellitus) 2004  . Hypertension 1997  . Hyperlipemia 1997  . Allergic rhinitis   . Anxiety   . Asthma   . Atopic dermatitis   . Benign essential hypertension   . Edema   . Special screening for malignant neoplasms, colon   . Esophagitis   . Headache   . Hearing loss   . Encounter for long-term (current) use of other  medications   . Hypercalcemia   . Sinus bradycardia   . Disorders of magnesium metabolism   . Hyposmolality and/or hyponatremia   . Pain in joint, lower leg   . Myalgia and myositis, unspecified   . Need for prophylactic vaccination and inoculation against influenza   . Other syndromes affecting cervical region   . Osteoporosis, unspecified   . Pain in joint, shoulder region   . Restless legs syndrome (RLS)   . Screening for depression   . Varicose veins of lower extremities with inflammation   . Unspecified essential hypertension   . Peripheral autonomic neuropathy in disorders classified elsewhere(337.1)   . Type 2 diabetes mellitus with autonomic neuropathy   . Urinary tract infection, site not specified   . Other screening mammogram   . Unspecified vitamin D deficiency     Past Surgical History  Procedure Laterality Date  . Appendectomy    . Cholecystectomy    . Abdominal hysterectomy    . Shoulder surgery      Repair  . Tonsillectomy    . Other surgical history      Nuclear Stress Test with EF 70%, Chest Radiograph-lungs are clear without pleural effusions or focal consolidations. Trachea projects midline and there is no pneumothorax. Suture anchors in right humeral head.  . Left heart catheterization with coronary angiogram N/A 09/30/2013    Procedure: LEFT HEART CATHETERIZATION WITH CORONARY ANGIOGRAM;  Surgeon: Troy Sine, MD;  Location: [facility] CATH LAB;  Service: Cardiovascular;  Laterality: N/A;    Current Outpatient Prescriptions  Medication Sig Dispense Refill  . albuterol (PROVENTIL HFA;VENTOLIN HFA) 108 (90 BASE) MCG/ACT inhaler Inhale 2 puffs into the lungs every 6 (six) hours as needed for wheezing or shortness of breath.    Marland Kitchen apixaban (ELIQUIS) 5 MG TABS tablet Take 5 mg by mouth 2 (two) times daily.     Marland Kitchen BIOTIN PO Take 1,000 mg by mouth daily.     . Cholecalciferol (VITAMIN D PO) Take 1,000 Units by mouth daily.    . diazepam (VALIUM) 5 MG tablet Take 5 mg  by mouth every 6 (six) hours as needed for anxiety.    Marland Kitchen FLUZONE HIGH-DOSE 0.5 ML SUSY   0  . furosemide (LASIX) 20 MG tablet Take 1 tablet (20 mg total) by mouth daily as needed for edema. 20 tablet 2  . GRAPE SEED EXTRACT PO Take 100 mg by mouth daily.     Marland Kitchen losartan-hydrochlorothiazide (HYZAAR) 50-12.5 MG per tablet Take 1 tablet by mouth daily. 30 tablet 11  . Magnesium 250 MG TABS 2 tabs po qd    . metoprolol (LOPRESSOR) 25 MG tablet Take 0.5 tablets (12.5 mg total) by mouth 2 (two) times daily. 30 tablet 0  . OVER THE COUNTER MEDICATION Take by mouth daily. 1 tsp for restless leg syndrome    . oxyCODONE-acetaminophen (PERCOCET/ROXICET) 5-325 MG per tablet Take by mouth every 4 (four) hours as needed for severe pain.    Marland Kitchen rOPINIRole (REQUIP) 1 MG tablet Take 2 mg by mouth at bedtime.     . vitamin B-12 (CYANOCOBALAMIN) 1000 MCG tablet Take 500 mcg by mouth daily.     . [DISCONTINUED] carvedilol (COREG) 3.125 MG tablet Take 3.125 mg by mouth 2 (two) times daily with a meal.     No current facility-administered medications for this visit.    Allergies  Allergen Reactions  . Daypro [Oxaprozin] Other (See Comments)    Palpitation  . Statins Other (See Comments)    Myalgias  . Zetia [Ezetimibe] Other (See Comments)    Joint Pain, Swelling    Review of Systems negative except from HPI and PMH  Physical Exam BP 200/78 mmHg  Pulse 59  Ht 5\' 1"  (1.549 m)  Wt 143 lb (64.864 kg)  BMI 27.03 kg/m2 Well developed and nourished in no acute distress HENT normal Neck supple with JVP-flat Clear Regular rate and rhythm, no murmurs or gallops Abd-soft with active BS No Clubbing cyanosis edema Skin-warm and dry A & Oriented  Grossly normal sensory and motor function   ECG demonstrated sinus rhythm at 59   Records were reviewed from the emergency room ECG demonstrated atrial fibrillation  Assessment and  Plan  Atrial fibrillation  Sinus bradycardia  Hypertension-urgency    Diabetes   Infrequent paroxysms of atrial fibrillation are associated with a rapid ventricular rate. She also has resting sinus bradycardia.    we have discussed the potential role of antiarrhythmic therapy. Given the paucity of symptoms and the relative infrequency, I suggested that we avoid antiarrhythmic therapy with the potential for side effects and proarrhythmia. She is agreeable to this. We will give her diltiazem to take every 6 hours with atrial fibrillation is rapid to try to attenuate symptoms.    She is on apixoban . Given her bruisability, she is 81 years old and approximately 65 kg. We will reduce her apixaban dose 5--2.5.   Her blood pressure is poorly controlled. It is now 623 systolic.  We will use clonidine in the office today get her blood pressure down  . We will plan to increase her losartan from 50/12.5--100/25. We will also put her on amlodipine 2.5 mg a day we'll have her follow-up with her PCP next week.  BP was 175 at discharge

## 2014-05-04 DIAGNOSIS — E782 Mixed hyperlipidemia: Secondary | ICD-10-CM | POA: Diagnosis not present

## 2014-05-04 DIAGNOSIS — F419 Anxiety disorder, unspecified: Secondary | ICD-10-CM | POA: Diagnosis not present

## 2014-06-30 ENCOUNTER — Encounter: Payer: Self-pay | Admitting: Internal Medicine

## 2014-06-30 ENCOUNTER — Ambulatory Visit (INDEPENDENT_AMBULATORY_CARE_PROVIDER_SITE_OTHER): Payer: Medicare Other | Admitting: Internal Medicine

## 2014-06-30 VITALS — BP 170/69 | HR 56 | Ht 62.0 in | Wt 145.2 lb

## 2014-06-30 DIAGNOSIS — I480 Paroxysmal atrial fibrillation: Secondary | ICD-10-CM | POA: Diagnosis not present

## 2014-06-30 LAB — BASIC METABOLIC PANEL
BUN: 18 mg/dL (ref 6–23)
CO2: 28 mEq/L (ref 19–32)
Calcium: 11.4 mg/dL — ABNORMAL HIGH (ref 8.4–10.5)
Chloride: 97 mEq/L (ref 96–112)
Creatinine, Ser: 1.03 mg/dL (ref 0.40–1.20)
GFR: 54.75 mL/min — ABNORMAL LOW (ref >60.00)
Glucose, Bld: 94 mg/dL (ref 70–99)
Potassium: 4.7 mEq/L (ref 3.5–5.1)
Sodium: 129 mEq/L — ABNORMAL LOW (ref 135–145)

## 2014-06-30 MED ORDER — APIXABAN 2.5 MG PO TABS: 2.5000 mg | ORAL_TABLET | Freq: Two times a day (BID) | ORAL | Status: DC

## 2014-06-30 NOTE — Patient Instructions (Signed)
Your physician has recommended you make the following change in your medication:  1) DECREASE Eliquis to 2.5 mg twice daily  Lab today: BMET  Your physician wants you to follow-up in: 6 months with Dr. Caryl Comes. You will receive a reminder letter in the mail two months in advance. If you don't receive a letter, please call our office to schedule the follow-up appointment.

## 2014-06-30 NOTE — Progress Notes (Signed)
Okay could remember      Patient Care Team: Tamsen Roers, MD as PCP - General (Family Medicine)   HPI  Kelly Boone is a 81 y.o. female Seen with recent diagnosis of atrial fibrillation for which she was started on Rivaroxaban coreg, losaran and Mag and aldactone.  She apparently underwent Myoview scanning and echocardiography which were unrevealing  reports were reviewed. Interestingly her left atrial dimension was normal.  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  She's had problems with paroxysmal rapid atrial fibrillation occurring in the context of sinus bradycardia.  We elected to try to use when necessary beta blockers  She has hx of hypertension diabetes.  She has had difficult to control blood pressure actually requiring clonidine in the office at the last visit.    She is currently on apixaban    Her biggest problem is her knees.    Past Medical History  Diagnosis Date  . A-fib     irregular heartbeat  . DM (diabetes mellitus) 2004  . Hypertension 1997  . Hyperlipemia 1997  . Allergic rhinitis   . Anxiety   . Asthma   . Atopic dermatitis   . Benign essential hypertension   . Edema   . Special screening for malignant neoplasms, colon   . Esophagitis   . Headache   . Hearing loss   . Encounter for long-term (current) use of other medications   . Hypercalcemia   . Sinus bradycardia   . Disorders of magnesium metabolism   . Hyposmolality and/or hyponatremia   . Pain in joint, lower leg   . Myalgia and myositis, unspecified   . Need for prophylactic vaccination and inoculation against influenza   . Other syndromes affecting cervical region   . Osteoporosis, unspecified   . Pain in joint, shoulder region   . Restless legs syndrome (RLS)   . Screening for depression   . Varicose veins of lower extremities with inflammation   . Unspecified essential  hypertension   . Peripheral autonomic neuropathy in disorders classified elsewhere(337.1)   . Type 2 diabetes mellitus with autonomic neuropathy   . Urinary tract infection, site not specified   . Other screening mammogram   . Unspecified vitamin D deficiency     Past Surgical History  Procedure Laterality Date  . Appendectomy    . Cholecystectomy    . Abdominal hysterectomy    . Shoulder surgery      Repair  . Tonsillectomy    . Other surgical history      Nuclear Stress Test with EF 70%, Chest Radiograph-lungs are clear without pleural effusions or focal consolidations. Trachea projects midline and there is no pneumothorax. Suture anchors in right humeral head.  . Left heart catheterization with coronary angiogram N/A 09/30/2013    Procedure: LEFT HEART CATHETERIZATION WITH CORONARY ANGIOGRAM;  Surgeon: Troy Sine, MD;  Location: [facility] CATH LAB;  Service: Cardiovascular;  Laterality: N/A;    Current Outpatient Prescriptions  Medication Sig Dispense Refill  . albuterol (PROVENTIL HFA;VENTOLIN HFA) 108 (90 BASE) MCG/ACT inhaler Inhale 2 puffs into the lungs every 6 (six) hours as needed for wheezing or shortness of breath.    Marland Kitchen amLODipine (NORVASC) 2.5 MG tablet Take 1 tablet (2.5 mg total) by mouth daily. 30 tablet 3  . apixaban (ELIQUIS) 5 MG TABS tablet Take 5 mg by mouth 2 (two) times daily.     Marland Kitchen  BIOTIN PO Take 1,000 mg by mouth daily.     . Cholecalciferol (VITAMIN D PO) Take 1,000 Units by mouth daily.    . diazepam (VALIUM) 5 MG tablet Take 5 mg by mouth every 6 (six) hours as needed for anxiety.    Marland Kitchen FLUZONE HIGH-DOSE 0.5 ML SUSY   0  . furosemide (LASIX) 20 MG tablet Take 1 tablet (20 mg total) by mouth daily as needed for edema. 20 tablet 2  . GRAPE SEED EXTRACT PO Take 100 mg by mouth daily.     Marland Kitchen losartan-hydrochlorothiazide (HYZAAR) 50-12.5 MG per tablet Take 2 tablets by mouth daily. 60 tablet 6  . Magnesium 250 MG TABS 2 tabs po qd    . metoprolol (LOPRESSOR) 25 MG  tablet Take 0.5 tablets (12.5 mg total) by mouth 2 (two) times daily. 30 tablet 0  . OVER THE COUNTER MEDICATION Take by mouth daily. 1 tsp for restless leg syndrome    . oxyCODONE-acetaminophen (PERCOCET/ROXICET) 5-325 MG per tablet Take by mouth every 4 (four) hours as needed for severe pain.    Marland Kitchen rOPINIRole (REQUIP) 1 MG tablet Take 2 mg by mouth at bedtime.     . vitamin B-12 (CYANOCOBALAMIN) 1000 MCG tablet Take 500 mcg by mouth daily.     . [DISCONTINUED] carvedilol (COREG) 3.125 MG tablet Take 3.125 mg by mouth 2 (two) times daily with a meal.     No current facility-administered medications for this visit.    Allergies  Allergen Reactions  . Daypro [Oxaprozin] Other (See Comments)    Palpitation  . Statins Other (See Comments)    Myalgias  . Zetia [Ezetimibe] Other (See Comments)    Joint Pain, Swelling    Review of Systems negative except from HPI and PMH  Physical Exam BP 170/69 mmHg  Pulse 56  Ht 5\' 2"  (1.575 m)  Wt 145 lb 3.2 oz (65.862 kg)  BMI 26.55 kg/m2 Well developed and nourished in no acute distress HENT normal Neck supple with JVP-flat Clear Regular rate and rhythm, no murmurs or gallops Abd-soft with active BS No Clubbing cyanosis edema Skin-warm and dry A & Oriented  Grossly normal sensory and motor function   ECG demonstrated sinus rhythm at 59   Records were reviewed from the emergency room ECG demonstrated atrial fibrillation  Assessment and  Plan  Atrial fibrillation  Sinus bradycardia  Hypertension- white coat  Diabetes    Atrial fibrillation appears quiescent.  We will decrease her apixaban 5--2.5 as she is 78 kg and 81 years old  We will check a metabolic profile on her hydrochlorothiazide.  Hypertension is well controlled at home.  Blood pressures at home are well-controlled.

## 2014-07-10 ENCOUNTER — Telehealth: Payer: Self-pay | Admitting: Internal Medicine

## 2014-07-10 NOTE — Telephone Encounter (Signed)
Patient notified of lab results and Dr. Olin Pia advisement that patient needs to follow up on her elevated Calcium level by contacting her PCP as soon as possible. Calcium results have been consistently elevated for at least the past 9 months. Patient verbalized understanding and agreement to continue with current treatment plan and to contact her PCP today.

## 2014-07-10 NOTE — Telephone Encounter (Signed)
New problem   Pt stated she was returning a call from Sherri from the beginning of the week. Pt don't know why she was calling. Please call pt.

## 2014-07-14 DIAGNOSIS — E213 Hyperparathyroidism, unspecified: Secondary | ICD-10-CM | POA: Diagnosis not present

## 2014-07-21 DIAGNOSIS — N189 Chronic kidney disease, unspecified: Secondary | ICD-10-CM | POA: Diagnosis not present

## 2014-07-21 DIAGNOSIS — I4891 Unspecified atrial fibrillation: Secondary | ICD-10-CM | POA: Diagnosis not present

## 2014-08-20 DIAGNOSIS — E119 Type 2 diabetes mellitus without complications: Secondary | ICD-10-CM | POA: Diagnosis not present

## 2014-08-20 DIAGNOSIS — I10 Essential (primary) hypertension: Secondary | ICD-10-CM | POA: Diagnosis not present

## 2014-08-20 DIAGNOSIS — I4891 Unspecified atrial fibrillation: Secondary | ICD-10-CM | POA: Diagnosis not present

## 2014-08-20 DIAGNOSIS — E782 Mixed hyperlipidemia: Secondary | ICD-10-CM | POA: Diagnosis not present

## 2014-09-11 DIAGNOSIS — M1712 Unilateral primary osteoarthritis, left knee: Secondary | ICD-10-CM | POA: Diagnosis not present

## 2014-09-18 ENCOUNTER — Telehealth: Payer: Self-pay | Admitting: *Deleted

## 2014-09-18 NOTE — Telephone Encounter (Signed)
Faxed clearance to [facility] for left knee: tka-medial & lateral w/wo patella resurfacing. Instructed to hold Eliquis 48 hour prior to procedure.

## 2014-09-22 ENCOUNTER — Other Ambulatory Visit: Payer: Self-pay | Admitting: Surgical

## 2014-10-06 DIAGNOSIS — M1712 Unilateral primary osteoarthritis, left knee: Secondary | ICD-10-CM | POA: Diagnosis not present

## 2014-10-07 NOTE — H&P (Signed)
TOTAL KNEE ADMISSION H&P  Patient is being admitted for left total knee arthroplasty.  Subjective:  Chief Complaint:left knee pain.  HPI: Kelly Boone, 81 y.o. female, has a history of pain and functional disability in the left knee due to arthritis and has failed non-surgical conservative treatments for greater than 12 weeks to includeNSAID's and/or analgesics, corticosteriod injections, viscosupplementation injections and activity modification.  Onset of symptoms was gradual, starting 5 years ago with gradually worsening course since that time. The patient noted no past surgery on the left knee(s).  Patient currently rates pain in the left knee(s) at 7 out of 10 with activity. Patient has night pain, worsening of pain with activity and weight bearing, pain that interferes with activities of daily living, pain with passive range of motion, crepitus and joint swelling.  Patient has evidence of periarticular osteophytes and joint space narrowing by imaging studies. There is no active infection.  Patient Active Problem List   Diagnosis Date Noted  . Takotsubo syndrome 09/28/2013  . A-fib   . Hypertension   . Sinus bradycardia    Past Medical History  Diagnosis Date  . A-fib     irregular heartbeat  . DM (diabetes mellitus) 2004  . Hypertension 1997  . Hyperlipemia 1997  . Allergic rhinitis   . Anxiety   . Asthma   . Atopic dermatitis   . Benign essential hypertension   . Edema   . Special screening for malignant neoplasms, colon   . Esophagitis   . Headache   . Hearing loss   . Encounter for long-term (current) use of other medications   . Hypercalcemia   . Sinus bradycardia   . Disorders of magnesium metabolism   . Hyposmolality and/or hyponatremia   . Pain in joint, lower leg   . Myalgia and myositis, unspecified   . Need for prophylactic vaccination and inoculation against influenza   . Other syndromes affecting cervical region   . Osteoporosis, unspecified   . Pain in  joint, shoulder region   . Restless legs syndrome (RLS)   . Screening for depression   . Varicose veins of lower extremities with inflammation   . Unspecified essential hypertension   . Peripheral autonomic neuropathy in disorders classified elsewhere(337.1)   . Type 2 diabetes mellitus with autonomic neuropathy   . Urinary tract infection, site not specified   . Other screening mammogram   . Unspecified vitamin D deficiency     Past Surgical History  Procedure Laterality Date  . Appendectomy    . Cholecystectomy    . Abdominal hysterectomy    . Shoulder surgery      Repair  . Tonsillectomy    . Other surgical history      Nuclear Stress Test with EF 70%, Chest Radiograph-lungs are clear without pleural effusions or focal consolidations. Trachea projects midline and there is no pneumothorax. Suture anchors in right humeral head.  . Left heart catheterization with coronary angiogram N/A 09/30/2013    Procedure: LEFT HEART CATHETERIZATION WITH CORONARY ANGIOGRAM;  Surgeon: Troy Sine, MD;  Location: [facility] CATH LAB;  Service: Cardiovascular;  Laterality: N/A;      Current outpatient prescriptions:  .  albuterol (PROVENTIL HFA;VENTOLIN HFA) 108 (90 BASE) MCG/ACT inhaler, Inhale 2 puffs into the lungs every 6 (six) hours as needed for wheezing or shortness of breath., Disp: , Rfl:  .  amLODipine (NORVASC) 2.5 MG tablet, Take 1 tablet (2.5 mg total) by mouth daily., Disp: 30 tablet, Rfl: 3 .  apixaban (ELIQUIS) 2.5 MG TABS tablet, Take 1 tablet (2.5 mg total) by mouth 2 (two) times daily. (Patient taking differently: Take 2.5 mg by mouth 2 (two) times daily. @ 0700 & 1930), Disp: 60 tablet, Rfl: 6 .  BIOTIN PO, Take 1,000 mg by mouth daily. , Disp: , Rfl:  .  Cholecalciferol (VITAMIN D PO), Take 1,000 Units by mouth daily., Disp: , Rfl:  .  cyanocobalamin 500 MCG tablet, Take 500 mcg by mouth daily., Disp: , Rfl:  .  diazepam (VALIUM) 5 MG tablet, Take 5 mg by mouth every 6 (six) hours as  needed for anxiety., Disp: , Rfl:  .  furosemide (LASIX) 20 MG tablet, Take 1 tablet (20 mg total) by mouth daily as needed for edema., Disp: 20 tablet, Rfl: 2 .  losartan-hydrochlorothiazide (HYZAAR) 50-12.5 MG per tablet, Take 2 tablets by mouth daily. (Patient taking differently: Take 1 tablet by mouth daily. ), Disp: 60 tablet, Rfl: 6 .  Magnesium 250 MG TABS, Take 2 tablets by mouth every morning. , Disp: , Rfl:  .  metoprolol (LOPRESSOR) 25 MG tablet, Take 0.5 tablets (12.5 mg total) by mouth 2 (two) times daily., Disp: 30 tablet, Rfl: 0 .  OVER THE COUNTER MEDICATION, Take 5 mLs by mouth daily. restless leg syndrome. OPC-3, Disp: , Rfl:  .  oxyCODONE-acetaminophen (PERCOCET/ROXICET) 5-325 MG per tablet, Take 0.5-1 tablets by mouth every 4 (four) hours as needed for severe pain. , Disp: , Rfl:  .  propranolol (INDERAL) 10 MG tablet, Take 10 mg by mouth every 6 (six) hours as needed (increased heart rate and fibulation)., Disp: , Rfl:  .  rOPINIRole (REQUIP) 2 MG tablet, Take 2 mg by mouth 2 (two) times daily. Before supper and bed, Disp: , Rfl:  .  [DISCONTINUED] carvedilol (COREG) 3.125 MG tablet, Take 3.125 mg by mouth 2 (two) times daily with a meal., Disp: , Rfl:   Allergies  Allergen Reactions  . Daypro [Oxaprozin] Other (See Comments)    Palpitation  . Statins Other (See Comments)    Myalgias  . Zetia [Ezetimibe] Other (See Comments)    Joint Pain, Swelling    History  Substance Use Topics  . Smoking status: Never Smoker   . Smokeless tobacco: Not on file  . Alcohol Use: No    Family History  Problem Relation Age of Onset  . Anemia Mother   . CAD Mother   . Hypertension Mother   . CAD Brother      Review of Systems  Constitutional: Negative.   HENT: Positive for hearing loss and tinnitus. Negative for congestion, ear discharge, ear pain, nosebleeds and sore throat.   Eyes: Negative.   Respiratory: Positive for wheezing. Negative for cough, hemoptysis, sputum  production, shortness of breath and stridor.   Cardiovascular: Negative.   Gastrointestinal: Negative.   Genitourinary: Positive for frequency. Negative for dysuria, urgency, hematuria and flank pain.  Musculoskeletal: Positive for myalgias and joint pain. Negative for back pain, falls and neck pain.  Skin: Negative.   Neurological: Positive for headaches. Negative for dizziness, tingling, tremors, sensory change, speech change, focal weakness, seizures and loss of consciousness.  Endo/Heme/Allergies: Positive for environmental allergies. Negative for polydipsia. Does not bruise/bleed easily.  Psychiatric/Behavioral: Negative.     Objective:  Physical Exam  Constitutional: She is oriented to person, place, and time. She appears well-developed and well-nourished. No distress.  HENT:  Head: Normocephalic and atraumatic.  Right Ear: External ear normal.  Left Ear: External ear  normal.  Nose: Nose normal.  Mouth/Throat: Oropharynx is clear and moist.  Eyes: Conjunctivae and EOM are normal.  Neck: Normal range of motion. Neck supple.  Cardiovascular: Normal rate, regular rhythm, normal heart sounds and intact distal pulses.   No murmur heard. Respiratory: Effort normal and breath sounds normal. No respiratory distress. She has no wheezes.  GI: Soft. Bowel sounds are normal. She exhibits no distension. There is no tenderness.  Musculoskeletal:       Right hip: Normal.       Left hip: Normal.       Right knee: She exhibits decreased range of motion and swelling. She exhibits no effusion and no erythema. Tenderness found. Medial joint line and lateral joint line tenderness noted.       Left knee: She exhibits decreased range of motion and swelling. She exhibits no effusion and no erythema. Tenderness found. Medial joint line and lateral joint line tenderness noted.  Neurological: She is alert and oriented to person, place, and time. She has normal strength and normal reflexes. No sensory  deficit.  Skin: No rash noted. She is not diaphoretic. No erythema.  Psychiatric: She has a normal mood and affect. Her behavior is normal.    Vitals  Weight: 142 lb Height: 62in Body Surface Area: 1.65 m Body Mass Index: 25.97 kg/m  BP: 140/73 (Sitting, Left Arm, Standard) HR: 72 bpm  Imaging Review Plain radiographs demonstrate severe degenerative joint disease of the left knee(s). The overall alignment ismild varus. The bone quality appears to be good for age and reported activity level.  Assessment/Plan:  End stage primary osteoarthritis, left knee   The patient history, physical examination, clinical judgment of the provider and imaging studies are consistent with end stage degenerative joint disease of the left knee(s) and total knee arthroplasty is deemed medically necessary. The treatment options including medical management, injection therapy arthroscopy and arthroplasty were discussed at length. The risks and benefits of total knee arthroplasty were presented and reviewed. The risks due to aseptic loosening, infection, stiffness, patella tracking problems, thromboembolic complications and other imponderables were discussed. The patient acknowledged the explanation, agreed to proceed with the plan and consent was signed. Patient is being admitted for inpatient treatment for surgery, pain control, PT, OT, prophylactic antibiotics, VTE prophylaxis, progressive ambulation and ADL's and discharge planning. The patient is planning to be discharged home with home health services    Topical TXA Wants spinal Cardio: Dr. Lisa Roca, PA-C

## 2014-10-07 NOTE — Patient Instructions (Signed)
Kelly Boone  10/07/2014   Your procedure is scheduled on: October 14, 2014   Report to [facility] Main  Entrance take [place]  elevators to 3rd floor to  [place] at  9:30  AM.  Call this number if you have problems the morning of surgery [phone]   Remember: ONLY 1 PERSON MAY GO WITH YOU TO SHORT STAY TO GET  READY MORNING OF [place].  Do not eat food or drink liquids :After Midnight.     Take these medicines the morning of surgery with A SIP OF WATER: Albuterol if needed, Amlodipine (Norvasc), Valium if needed, Metoprolol (Lopressor), Propranolol if needed                               You may not have any metal on your body including hair pins and              piercings  Do not wear jewelry, make-up, lotions, powders or perfumes, deodorant             Do not wear nail polish.  Do not shave  48 hours prior to surgery.             .   Do not bring valuables to the hospital. [place].  Contacts, dentures or bridgework may not be worn into surgery.  Leave suitcase in the car. After surgery it may be brought to your room.         Special Instructions: coughing and deep breathing exercises, leg exercises              Please read over the following fact sheets you were given: _____________________________________________________________________             [facility] - Preparing for Surgery Before surgery, you can play an important role.  Because skin is not sterile, your skin needs to be as free of germs as possible.  You can reduce the number of germs on your skin by washing with CHG (chlorahexidine gluconate) soap before surgery.  CHG is an antiseptic cleaner which kills germs and bonds with the skin to continue killing germs even after washing. Please DO NOT use if you have an allergy to CHG or antibacterial soaps.  If your skin becomes reddened/irritated stop using the CHG and inform your  nurse when you arrive at Short Stay. Do not shave (including legs and underarms) for at least 48 hours prior to the first CHG shower.  You may shave your face/neck. Please follow these instructions carefully:  1.  Shower with CHG Soap the night before surgery and the  morning of Surgery.  2.  If you choose to wash your hair, wash your hair first as usual with your  normal  shampoo.  3.  After you shampoo, rinse your hair and body thoroughly to remove the  shampoo.                           4.  Use CHG as you would any other liquid soap.  You can apply chg directly  to the skin and wash  Gently with a scrungie or clean washcloth.  5.  Apply the CHG Soap to your body ONLY FROM THE NECK DOWN.   Do not use on face/ open                           Wound or open sores. Avoid contact with eyes, ears mouth and genitals (private parts).                       Wash face,  Genitals (private parts) with your normal soap.             6.  Wash thoroughly, paying special attention to the area where your surgery  will be performed.  7.  Thoroughly rinse your body with warm water from the neck down.  8.  DO NOT shower/wash with your normal soap after using and rinsing off  the CHG Soap.                9.  Pat yourself dry with a clean towel.            10.  Wear clean pajamas.            11.  Place clean sheets on your bed the night of your first shower and do not  sleep with pets. Day of Surgery : Do not apply any lotions/deodorants the morning of surgery.  Please wear clean clothes to the hospital/surgery center.  FAILURE TO FOLLOW THESE INSTRUCTIONS MAY RESULT IN THE CANCELLATION OF YOUR SURGERY PATIENT SIGNATURE_________________________________  NURSE SIGNATURE__________________________________  ________________________________________________________________________  WHAT IS A BLOOD TRANSFUSION? Blood Transfusion Information  A transfusion is the replacement of blood or some of its  parts. Blood is made up of multiple cells which provide different functions.  Red blood cells carry oxygen and are used for blood loss replacement.  White blood cells fight against infection.  Platelets control bleeding.  Plasma helps clot blood.  Other blood products are available for specialized needs, such as hemophilia or other clotting disorders. BEFORE THE TRANSFUSION  Who gives blood for transfusions?   Healthy volunteers who are fully evaluated to make sure their blood is safe. This is blood bank blood. Transfusion therapy is the safest it has ever been in the practice of medicine. Before blood is taken from a donor, a complete history is taken to make sure that person has no history of diseases nor engages in risky social behavior (examples are intravenous drug use or sexual activity with multiple partners). The donor's travel history is screened to minimize risk of transmitting infections, such as malaria. The donated blood is tested for signs of infectious diseases, such as HIV and hepatitis. The blood is then tested to be sure it is compatible with you in order to minimize the chance of a transfusion reaction. If you or a relative donates blood, this is often done in anticipation of surgery and is not appropriate for emergency situations. It takes many days to process the donated blood. RISKS AND COMPLICATIONS Although transfusion therapy is very safe and saves many lives, the main dangers of transfusion include:  1. Getting an infectious disease. 2. Developing a transfusion reaction. This is an allergic reaction to something in the blood you were given. Every precaution is taken to prevent this. The decision to have a blood transfusion has been considered carefully by your caregiver before blood is given. Blood is not given unless the benefits outweigh  the risks. AFTER THE TRANSFUSION  Right after receiving a blood transfusion, you will usually feel much better and more energetic.  This is especially true if your red blood cells have gotten low (anemic). The transfusion raises the level of the red blood cells which carry oxygen, and this usually causes an energy increase.  The nurse administering the transfusion will monitor you carefully for complications. HOME CARE INSTRUCTIONS  No special instructions are needed after a transfusion. You may find your energy is better. Speak with your caregiver about any limitations on activity for underlying diseases you may have. SEEK MEDICAL CARE IF:   Your condition is not improving after your transfusion.  You develop redness or irritation at the intravenous (IV) site. SEEK IMMEDIATE MEDICAL CARE IF:  Any of the following symptoms occur over the next 12 hours:  Shaking chills.  You have a temperature by mouth above 102 F (38.9 C), not controlled by medicine.  Chest, back, or muscle pain.  People around you feel you are not acting correctly or are confused.  Shortness of breath or difficulty breathing.  Dizziness and fainting.  You get a rash or develop hives.  You have a decrease in urine output.  Your urine turns a dark color or changes to pink, red, or brown. Any of the following symptoms occur over the next 10 days:  You have a temperature by mouth above 102 F (38.9 C), not controlled by medicine.  Shortness of breath.  Weakness after normal activity.  The white part of the eye turns yellow (jaundice).  You have a decrease in the amount of urine or are urinating less often.  Your urine turns a dark color or changes to pink, red, or brown. Document Released: 03/17/2000 Document Revised: 06/12/2011 Document Reviewed: 11/04/2007 ExitCare Patient Information 2014 [place].  _______________________________________________________________________  Incentive Spirometer  An incentive spirometer is a tool that can help keep your lungs clear and active. This tool measures how well you are filling  your lungs with each breath. Taking long deep breaths may help reverse or decrease the chance of developing breathing (pulmonary) problems (especially infection) following:  A long period of time when you are unable to move or be active. BEFORE THE PROCEDURE   If the spirometer includes an indicator to show your best effort, your nurse or respiratory therapist will set it to a desired goal.  If possible, sit up straight or lean slightly forward. Try not to slouch.  Hold the incentive spirometer in an upright position. INSTRUCTIONS FOR USE  3. Sit on the edge of your bed if possible, or sit up as far as you can in bed or on a chair. 4. Hold the incentive spirometer in an upright position. 5. Breathe out normally. 6. Place the mouthpiece in your mouth and seal your lips tightly around it. 7. Breathe in slowly and as deeply as possible, raising the piston or the ball toward the top of the column. 8. Hold your breath for 3-5 seconds or for as long as possible. Allow the piston or ball to fall to the bottom of the column. 9. Remove the mouthpiece from your mouth and breathe out normally. 10. Rest for a few seconds and repeat Steps 1 through 7 at least 10 times every 1-2 hours when you are awake. Take your time and take a few normal breaths between deep breaths. 11. The spirometer may include an indicator to show your best effort. Use the indicator as a goal to work  toward during each repetition. 12. After each set of 10 deep breaths, practice coughing to be sure your lungs are clear. If you have an incision (the cut made at the time of surgery), support your incision when coughing by placing a pillow or rolled up towels firmly against it. Once you are able to get out of bed, walk around indoors and cough well. You may stop using the incentive spirometer when instructed by your caregiver.  RISKS AND COMPLICATIONS  Take your time so you do not get dizzy or light-headed.  If you are in pain, you  may need to take or ask for pain medication before doing incentive spirometry. It is harder to take a deep breath if you are having pain. AFTER USE  Rest and breathe slowly and easily.  It can be helpful to keep track of a log of your progress. Your caregiver can provide you with a simple table to help with this. If you are using the spirometer at home, follow these instructions: [place] IF:   You are having difficultly using the spirometer.  You have trouble using the spirometer as often as instructed.  Your pain medication is not giving enough relief while using the spirometer.  You develop fever of 100.5 F (38.1 C) or higher. SEEK IMMEDIATE MEDICAL CARE IF:   You cough up bloody sputum that had not been present before.  You develop fever of 102 F (38.9 C) or greater.  You develop worsening pain at or near the incision site. MAKE SURE YOU:   Understand these instructions.  Will watch your condition.  Will get help right away if you are not doing well or get worse. Document Released: 07/31/2006 Document Revised: 06/12/2011 Document Reviewed: 10/01/2006 [facility] Patient Information 2014 [place], [place].   ________________________________________________________________________

## 2014-10-08 ENCOUNTER — Encounter (HOSPITAL_COMMUNITY)
Admission: RE | Admit: 2014-10-08 | Discharge: 2014-10-08 | Disposition: A | Discharge status: Home / Self Care | Payer: Medicare Other | Source: Ambulatory Visit | Attending: Orthopedic Surgery | Admitting: Orthopedic Surgery

## 2014-10-08 ENCOUNTER — Ambulatory Visit (HOSPITAL_COMMUNITY)
Admission: RE | Admit: 2014-10-08 | Discharge: 2014-10-08 | Disposition: A | Discharge status: Home / Self Care | Payer: Medicare Other | Source: Ambulatory Visit | Attending: Surgical | Admitting: Surgical

## 2014-10-08 ENCOUNTER — Encounter (HOSPITAL_COMMUNITY): Payer: Self-pay

## 2014-10-08 DIAGNOSIS — Z01818 Encounter for other preprocedural examination: Secondary | ICD-10-CM | POA: Diagnosis not present

## 2014-10-08 DIAGNOSIS — E119 Type 2 diabetes mellitus without complications: Secondary | ICD-10-CM | POA: Insufficient documentation

## 2014-10-08 DIAGNOSIS — J45909 Unspecified asthma, uncomplicated: Secondary | ICD-10-CM | POA: Insufficient documentation

## 2014-10-08 DIAGNOSIS — I10 Essential (primary) hypertension: Secondary | ICD-10-CM | POA: Insufficient documentation

## 2014-10-08 HISTORY — DX: Major depressive disorder, single episode, unspecified: F32.9

## 2014-10-08 HISTORY — DX: Unspecified osteoarthritis, unspecified site: M19.90

## 2014-10-08 HISTORY — DX: Chronic kidney disease, unspecified: N18.9

## 2014-10-08 HISTORY — DX: Acute myocardial infarction, unspecified: I21.9

## 2014-10-08 HISTORY — DX: Pneumonia, unspecified organism: J18.9

## 2014-10-08 HISTORY — DX: Gastro-esophageal reflux disease without esophagitis: K21.9

## 2014-10-08 HISTORY — DX: Depression, unspecified: F32.A

## 2014-10-08 LAB — COMPREHENSIVE METABOLIC PANEL
ALT: 19 U/L (ref 14–54)
AST: 26 U/L (ref 15–41)
Albumin: 4.3 g/dL (ref 3.5–5.0)
Alkaline Phosphatase: 85 U/L (ref 38–126)
Anion gap: 6 (ref 5–15)
BUN: 20 mg/dL (ref 6–20)
CO2: 27 mmol/L (ref 22–32)
Calcium: 11.4 mg/dL — ABNORMAL HIGH (ref 8.9–10.3)
Chloride: 100 mmol/L — ABNORMAL LOW (ref 101–111)
Creatinine, Ser: 0.88 mg/dL (ref 0.44–1.00)
GFR calc Af Amer: >60 mL/min (ref >60)
GFR calc non Af Amer: >60 mL/min (ref >60)
Glucose, Bld: 106 mg/dL — ABNORMAL HIGH (ref 65–99)
Potassium: 4.9 mmol/L (ref 3.5–5.1)
Sodium: 133 mmol/L — ABNORMAL LOW (ref 135–145)
Total Bilirubin: 0.6 mg/dL (ref 0.3–1.2)
Total Protein: 7.7 g/dL (ref 6.5–8.1)

## 2014-10-08 LAB — URINALYSIS, ROUTINE W REFLEX MICROSCOPIC
Bilirubin Urine: NEGATIVE
Glucose, UA: NEGATIVE mg/dL
Hgb urine dipstick: NEGATIVE
Ketones, ur: NEGATIVE mg/dL
Nitrite: POSITIVE — AB
Protein, ur: NEGATIVE mg/dL
Specific Gravity, Urine: 1.01 (ref 1.005–1.030)
Urobilinogen, UA: 0.2 mg/dL (ref 0.0–1.0)
pH: 6.5 (ref 5.0–8.0)

## 2014-10-08 LAB — ABO/RH: ABO/RH(D): A POS

## 2014-10-08 LAB — CBC WITH DIFFERENTIAL/PLATELET
Basophils Absolute: 0 10*3/uL (ref 0.0–0.1)
Basophils Relative: 0 % (ref 0–1)
Eosinophils Absolute: 0.1 10*3/uL (ref 0.0–0.7)
Eosinophils Relative: 1 % (ref 0–5)
HCT: 33.5 % — ABNORMAL LOW (ref 36.0–46.0)
Hemoglobin: 11.6 g/dL — ABNORMAL LOW (ref 12.0–15.0)
Lymphocytes Relative: 29 % (ref 12–46)
Lymphs Abs: 2 10*3/uL (ref 0.7–4.0)
MCH: 30.8 pg (ref 26.0–34.0)
MCHC: 34.6 g/dL (ref 30.0–36.0)
MCV: 88.9 fL (ref 78.0–100.0)
Monocytes Absolute: 0.6 10*3/uL (ref 0.1–1.0)
Monocytes Relative: 9 % (ref 3–12)
Neutro Abs: 4.2 10*3/uL (ref 1.7–7.7)
Neutrophils Relative %: 61 % (ref 43–77)
Platelets: 332 10*3/uL (ref 150–400)
RBC: 3.77 MIL/uL — ABNORMAL LOW (ref 3.87–5.11)
RDW: 12.5 % (ref 11.5–15.5)
WBC: 6.9 10*3/uL (ref 4.0–10.5)

## 2014-10-08 LAB — SURGICAL PCR SCREEN
MRSA, PCR: NEGATIVE
Staphylococcus aureus: NEGATIVE

## 2014-10-08 LAB — PROTIME-INR
INR: 1.15 (ref 0.00–1.49)
Prothrombin Time: 14.9 seconds (ref 11.6–15.2)

## 2014-10-08 LAB — URINE MICROSCOPIC-ADD ON

## 2014-10-08 LAB — APTT: aPTT: 35 seconds (ref 24–37)

## 2014-10-08 NOTE — Progress Notes (Addendum)
09-11-14 - Surgical Clearance - Dr. Caryl Comes (cardio)in chart 06-30-14 - LOV - Dr. Caryl Comes (cardiio)- EPIC 06-30-14 - EKG - EPIC 04-17-14 - 1V CXR- EPIC 11-03-13 - Echo - EPIC

## 2014-10-09 NOTE — Progress Notes (Signed)
10-09-14 - UA and UA Micro from preop appointment 10-08-14 faxed to Dr. Gladstone Lighter via [facility]

## 2014-10-14 ENCOUNTER — Encounter (HOSPITAL_COMMUNITY)
Admission: RE | Disposition: A | Discharge status: Home Health | Payer: Self-pay | Source: Ambulatory Visit | Attending: Orthopedic Surgery

## 2014-10-14 ENCOUNTER — Encounter (HOSPITAL_COMMUNITY): Payer: Self-pay | Admitting: Certified Registered Nurse Anesthetist

## 2014-10-14 ENCOUNTER — Inpatient Hospital Stay (HOSPITAL_COMMUNITY)
Admission: RE | Admit: 2014-10-14 | Discharge: 2014-10-16 | DRG: 470 | Disposition: A | Discharge status: Home Health | Payer: Medicare Other | Source: Ambulatory Visit | Attending: Orthopedic Surgery | Admitting: Orthopedic Surgery

## 2014-10-14 ENCOUNTER — Inpatient Hospital Stay (HOSPITAL_COMMUNITY): Payer: Medicare Other | Admitting: Certified Registered Nurse Anesthetist

## 2014-10-14 DIAGNOSIS — M810 Age-related osteoporosis without current pathological fracture: Secondary | ICD-10-CM | POA: Diagnosis present

## 2014-10-14 DIAGNOSIS — Z96659 Presence of unspecified artificial knee joint: Secondary | ICD-10-CM

## 2014-10-14 DIAGNOSIS — Z888 Allergy status to other drugs, medicaments and biological substances status: Secondary | ICD-10-CM | POA: Diagnosis not present

## 2014-10-14 DIAGNOSIS — I5181 Takotsubo syndrome: Secondary | ICD-10-CM | POA: Diagnosis present

## 2014-10-14 DIAGNOSIS — H9190 Unspecified hearing loss, unspecified ear: Secondary | ICD-10-CM | POA: Diagnosis present

## 2014-10-14 DIAGNOSIS — Z9049 Acquired absence of other specified parts of digestive tract: Secondary | ICD-10-CM | POA: Diagnosis present

## 2014-10-14 DIAGNOSIS — I252 Old myocardial infarction: Secondary | ICD-10-CM

## 2014-10-14 DIAGNOSIS — I10 Essential (primary) hypertension: Secondary | ICD-10-CM | POA: Diagnosis not present

## 2014-10-14 DIAGNOSIS — I4891 Unspecified atrial fibrillation: Secondary | ICD-10-CM | POA: Diagnosis present

## 2014-10-14 DIAGNOSIS — I8393 Asymptomatic varicose veins of bilateral lower extremities: Secondary | ICD-10-CM | POA: Diagnosis present

## 2014-10-14 DIAGNOSIS — Z8249 Family history of ischemic heart disease and other diseases of the circulatory system: Secondary | ICD-10-CM

## 2014-10-14 DIAGNOSIS — E1143 Type 2 diabetes mellitus with diabetic autonomic (poly)neuropathy: Secondary | ICD-10-CM | POA: Diagnosis not present

## 2014-10-14 DIAGNOSIS — R51 Headache: Secondary | ICD-10-CM | POA: Diagnosis present

## 2014-10-14 DIAGNOSIS — Z87442 Personal history of urinary calculi: Secondary | ICD-10-CM | POA: Diagnosis not present

## 2014-10-14 DIAGNOSIS — M1712 Unilateral primary osteoarthritis, left knee: Principal | ICD-10-CM | POA: Diagnosis present

## 2014-10-14 DIAGNOSIS — F329 Major depressive disorder, single episode, unspecified: Secondary | ICD-10-CM | POA: Diagnosis present

## 2014-10-14 DIAGNOSIS — Z90710 Acquired absence of both cervix and uterus: Secondary | ICD-10-CM

## 2014-10-14 DIAGNOSIS — E785 Hyperlipidemia, unspecified: Secondary | ICD-10-CM | POA: Diagnosis present

## 2014-10-14 DIAGNOSIS — K219 Gastro-esophageal reflux disease without esophagitis: Secondary | ICD-10-CM | POA: Diagnosis present

## 2014-10-14 DIAGNOSIS — Z7901 Long term (current) use of anticoagulants: Secondary | ICD-10-CM | POA: Diagnosis not present

## 2014-10-14 DIAGNOSIS — G2581 Restless legs syndrome: Secondary | ICD-10-CM | POA: Diagnosis present

## 2014-10-14 DIAGNOSIS — E559 Vitamin D deficiency, unspecified: Secondary | ICD-10-CM | POA: Diagnosis present

## 2014-10-14 DIAGNOSIS — F419 Anxiety disorder, unspecified: Secondary | ICD-10-CM | POA: Diagnosis present

## 2014-10-14 DIAGNOSIS — Z79891 Long term (current) use of opiate analgesic: Secondary | ICD-10-CM

## 2014-10-14 DIAGNOSIS — J45909 Unspecified asthma, uncomplicated: Secondary | ICD-10-CM | POA: Diagnosis present

## 2014-10-14 DIAGNOSIS — Z79899 Other long term (current) drug therapy: Secondary | ICD-10-CM

## 2014-10-14 DIAGNOSIS — M179 Osteoarthritis of knee, unspecified: Secondary | ICD-10-CM | POA: Diagnosis not present

## 2014-10-14 HISTORY — PX: TOTAL KNEE ARTHROPLASTY: SHX125

## 2014-10-14 LAB — GLUCOSE, CAPILLARY
Glucose-Capillary: 96 mg/dL (ref 65–99)
Glucose-Capillary: 81 mg/dL (ref 65–99)

## 2014-10-14 LAB — TYPE AND SCREEN
ABO/RH(D): A POS
Antibody Screen: NEGATIVE

## 2014-10-14 SURGERY — ARTHROPLASTY, KNEE, TOTAL
Anesthesia: Spinal | Site: Knee | Laterality: Left

## 2014-10-14 MED ORDER — PROPOFOL 10 MG/ML IV BOLUS
INTRAVENOUS | Status: AC
  Filled 2014-10-14: qty 20

## 2014-10-14 MED ORDER — PROPOFOL INFUSION 10 MG/ML OPTIME
INTRAVENOUS | Status: DC | PRN
  Administered 2014-10-14: 120 ug/kg/min via INTRAVENOUS

## 2014-10-14 MED ORDER — HYDROMORPHONE HCL 1 MG/ML IJ SOLN
INTRAMUSCULAR | Status: AC
  Filled 2014-10-14: qty 1

## 2014-10-14 MED ORDER — LOSARTAN POTASSIUM-HCTZ 50-12.5 MG PO TABS: 2.0000 | ORAL_TABLET | Freq: Every day | ORAL | Status: DC

## 2014-10-14 MED ORDER — RIVAROXABAN 10 MG PO TABS: 10.0000 mg | ORAL_TABLET | Freq: Every day | ORAL | Status: DC

## 2014-10-14 MED ORDER — MENTHOL 3 MG MT LOZG: 1.0000 | LOZENGE | OROMUCOSAL | Status: DC | PRN

## 2014-10-14 MED ORDER — SODIUM CHLORIDE 0.9 % IJ SOLN
INTRAMUSCULAR | Status: AC
  Filled 2014-10-14: qty 50

## 2014-10-14 MED ORDER — ETOMIDATE 2 MG/ML IV SOLN
INTRAVENOUS | Status: AC
  Filled 2014-10-14: qty 10

## 2014-10-14 MED ORDER — HYDROMORPHONE HCL 1 MG/ML IJ SOLN
0.5000 mg | INTRAMUSCULAR | Status: DC | PRN
  Administered 2014-10-14: 0.5 mg via INTRAVENOUS
  Filled 2014-10-14: qty 1

## 2014-10-14 MED ORDER — LACTATED RINGERS IV SOLN
INTRAVENOUS | Status: DC
  Administered 2014-10-14: 1000 mL via INTRAVENOUS
  Administered 2014-10-14: 14:00:00 via INTRAVENOUS

## 2014-10-14 MED ORDER — FENTANYL CITRATE (PF) 100 MCG/2ML IJ SOLN
INTRAMUSCULAR | Status: DC | PRN
  Administered 2014-10-14: 50 ug via INTRAVENOUS
  Administered 2014-10-14 (×2): 25 ug via INTRAVENOUS

## 2014-10-14 MED ORDER — LACTATED RINGERS IV SOLN: INTRAVENOUS | Status: DC

## 2014-10-14 MED ORDER — STERILE WATER FOR IRRIGATION IR SOLN
Status: DC | PRN
  Administered 2014-10-14: 1500 mL

## 2014-10-14 MED ORDER — MIDAZOLAM HCL 2 MG/2ML IJ SOLN
INTRAMUSCULAR | Status: AC
  Filled 2014-10-14: qty 2

## 2014-10-14 MED ORDER — SODIUM CHLORIDE 0.9 % IJ SOLN
INTRAMUSCULAR | Status: AC
  Filled 2014-10-14: qty 10

## 2014-10-14 MED ORDER — DEXAMETHASONE SODIUM PHOSPHATE 10 MG/ML IJ SOLN
INTRAMUSCULAR | Status: DC | PRN
Start: 2014-10-14 — End: 2014-10-14
  Administered 2014-10-14: 10 mg via INTRAVENOUS

## 2014-10-14 MED ORDER — MIDAZOLAM HCL 5 MG/5ML IJ SOLN
INTRAMUSCULAR | Status: DC | PRN
  Administered 2014-10-14: .25 mg via INTRAVENOUS

## 2014-10-14 MED ORDER — LOSARTAN POTASSIUM 50 MG PO TABS
100.0000 mg | ORAL_TABLET | Freq: Every day | ORAL | Status: DC
  Administered 2014-10-14 – 2014-10-16 (×3): 100 mg via ORAL
  Filled 2014-10-14 (×3): qty 2

## 2014-10-14 MED ORDER — BUPIVACAINE IN DEXTROSE 0.75-8.25 % IT SOLN
INTRATHECAL | Status: DC | PRN
  Administered 2014-10-14: 15 mg via INTRATHECAL

## 2014-10-14 MED ORDER — LACTATED RINGERS IV SOLN
INTRAVENOUS | Status: DC
  Administered 2014-10-14 – 2014-10-15 (×2): via INTRAVENOUS

## 2014-10-14 MED ORDER — METHOCARBAMOL 500 MG PO TABS
500.0000 mg | ORAL_TABLET | Freq: Four times a day (QID) | ORAL | Status: DC | PRN
  Administered 2014-10-14 – 2014-10-16 (×3): 500 mg via ORAL
  Filled 2014-10-14 (×3): qty 1

## 2014-10-14 MED ORDER — BUPIVACAINE HCL (PF) 0.25 % IJ SOLN
INTRAMUSCULAR | Status: DC | PRN
  Administered 2014-10-14: 20 mL

## 2014-10-14 MED ORDER — THROMBIN 5000 UNITS EX SOLR
CUTANEOUS | Status: DC | PRN
  Administered 2014-10-14: 13:00:00 via TOPICAL

## 2014-10-14 MED ORDER — LIDOCAINE HCL (CARDIAC) 20 MG/ML IV SOLN
INTRAVENOUS | Status: AC
  Filled 2014-10-14: qty 5

## 2014-10-14 MED ORDER — SODIUM CHLORIDE 0.9 % IR SOLN
Status: AC
  Filled 2014-10-14: qty 1

## 2014-10-14 MED ORDER — BUPIVACAINE LIPOSOME 1.3 % IJ SUSP
20.0000 mL | Freq: Once | INTRAMUSCULAR | Status: DC
  Filled 2014-10-14: qty 20

## 2014-10-14 MED ORDER — HYDROCODONE-ACETAMINOPHEN 5-325 MG PO TABS
1.0000 | ORAL_TABLET | ORAL | Status: DC | PRN
  Administered 2014-10-14 – 2014-10-15 (×4): 1 via ORAL
  Administered 2014-10-15: 2 via ORAL
  Administered 2014-10-15 – 2014-10-16 (×3): 1 via ORAL
  Filled 2014-10-14: qty 1
  Filled 2014-10-14: qty 2
  Filled 2014-10-14 (×3): qty 1
  Filled 2014-10-14: qty 2
  Filled 2014-10-14 (×2): qty 1

## 2014-10-14 MED ORDER — OXYCODONE-ACETAMINOPHEN 5-325 MG PO TABS
0.5000 | ORAL_TABLET | ORAL | Status: DC | PRN
Start: 2014-10-14 — End: 2014-10-16

## 2014-10-14 MED ORDER — SODIUM CHLORIDE 0.9 % IJ SOLN
INTRAMUSCULAR | Status: DC | PRN
  Administered 2014-10-14: 20 mL

## 2014-10-14 MED ORDER — AMLODIPINE BESYLATE 2.5 MG PO TABS
2.5000 mg | ORAL_TABLET | Freq: Every day | ORAL | Status: DC
  Administered 2014-10-15 – 2014-10-16 (×2): 2.5 mg via ORAL
  Filled 2014-10-14 (×2): qty 1

## 2014-10-14 MED ORDER — ALUM & MAG HYDROXIDE-SIMETH 200-200-20 MG/5ML PO SUSP: 30.0000 mL | ORAL | Status: DC | PRN

## 2014-10-14 MED ORDER — CHLORHEXIDINE GLUCONATE 4 % EX LIQD: 60.0000 mL | Freq: Once | CUTANEOUS | Status: DC

## 2014-10-14 MED ORDER — CEFAZOLIN SODIUM 1-5 GM-% IV SOLN
1.0000 g | Freq: Four times a day (QID) | INTRAVENOUS | Status: AC
  Administered 2014-10-14 – 2014-10-15 (×2): 1 g via INTRAVENOUS
  Filled 2014-10-14 (×2): qty 50

## 2014-10-14 MED ORDER — CEFAZOLIN SODIUM-DEXTROSE 2-3 GM-% IV SOLR
INTRAVENOUS | Status: AC
  Filled 2014-10-14: qty 50

## 2014-10-14 MED ORDER — DEXAMETHASONE SODIUM PHOSPHATE 10 MG/ML IJ SOLN
INTRAMUSCULAR | Status: AC
  Filled 2014-10-14: qty 1

## 2014-10-14 MED ORDER — ONDANSETRON HCL 4 MG/2ML IJ SOLN
INTRAMUSCULAR | Status: DC | PRN
  Administered 2014-10-14: 4 mg via INTRAVENOUS

## 2014-10-14 MED ORDER — CEFAZOLIN SODIUM-DEXTROSE 2-3 GM-% IV SOLR
2.0000 g | INTRAVENOUS | Status: AC
  Administered 2014-10-14: 2 g via INTRAVENOUS

## 2014-10-14 MED ORDER — ACETAMINOPHEN 325 MG PO TABS
650.0000 mg | ORAL_TABLET | Freq: Four times a day (QID) | ORAL | Status: DC | PRN
  Administered 2014-10-16: 650 mg via ORAL
  Filled 2014-10-14: qty 2

## 2014-10-14 MED ORDER — PHENYLEPHRINE HCL 10 MG/ML IJ SOLN
INTRAMUSCULAR | Status: AC
  Filled 2014-10-14: qty 1

## 2014-10-14 MED ORDER — EPHEDRINE SULFATE 50 MG/ML IJ SOLN
INTRAMUSCULAR | Status: DC | PRN
Start: 2014-10-14 — End: 2014-10-14
  Administered 2014-10-14 (×2): 2.5 mg via INTRAVENOUS
  Administered 2014-10-14 (×2): 5 mg via INTRAVENOUS

## 2014-10-14 MED ORDER — PROPRANOLOL HCL 10 MG PO TABS
10.0000 mg | ORAL_TABLET | Freq: Four times a day (QID) | ORAL | Status: DC | PRN
  Filled 2014-10-14: qty 1

## 2014-10-14 MED ORDER — DEXTROSE 5 % IV SOLN
500.0000 mg | Freq: Four times a day (QID) | INTRAVENOUS | Status: DC | PRN
  Administered 2014-10-14: 500 mg via INTRAVENOUS
  Filled 2014-10-14 (×2): qty 5

## 2014-10-14 MED ORDER — BUPIVACAINE LIPOSOME 1.3 % IJ SUSP
INTRAMUSCULAR | Status: DC | PRN
  Administered 2014-10-14: 20 mL

## 2014-10-14 MED ORDER — ACETAMINOPHEN 650 MG RE SUPP: 650.0000 mg | Freq: Four times a day (QID) | RECTAL | Status: DC | PRN

## 2014-10-14 MED ORDER — FLEET ENEMA 7-19 GM/118ML RE ENEM: 1.0000 | ENEMA | Freq: Once | RECTAL | Status: AC | PRN

## 2014-10-14 MED ORDER — ONDANSETRON HCL 4 MG PO TABS: 4.0000 mg | ORAL_TABLET | Freq: Four times a day (QID) | ORAL | Status: DC | PRN

## 2014-10-14 MED ORDER — BISACODYL 5 MG PO TBEC: 5.0000 mg | DELAYED_RELEASE_TABLET | Freq: Every day | ORAL | Status: DC | PRN

## 2014-10-14 MED ORDER — HYDROCHLOROTHIAZIDE 25 MG PO TABS
25.0000 mg | ORAL_TABLET | Freq: Every day | ORAL | Status: DC
  Administered 2014-10-14 – 2014-10-16 (×3): 25 mg via ORAL
  Filled 2014-10-14 (×3): qty 1

## 2014-10-14 MED ORDER — ONDANSETRON HCL 4 MG/2ML IJ SOLN
INTRAMUSCULAR | Status: AC
  Filled 2014-10-14: qty 2

## 2014-10-14 MED ORDER — SODIUM CHLORIDE 0.9 % IR SOLN
Status: DC | PRN
  Administered 2014-10-14: 1000 mL

## 2014-10-14 MED ORDER — FUROSEMIDE 20 MG PO TABS
20.0000 mg | ORAL_TABLET | Freq: Every day | ORAL | Status: DC | PRN
  Filled 2014-10-14: qty 1

## 2014-10-14 MED ORDER — ROPINIROLE HCL 1 MG PO TABS
2.0000 mg | ORAL_TABLET | Freq: Two times a day (BID) | ORAL | Status: DC
  Administered 2014-10-14 – 2014-10-16 (×4): 2 mg via ORAL
  Filled 2014-10-14 (×5): qty 2

## 2014-10-14 MED ORDER — PHENOL 1.4 % MT LIQD
1.0000 | OROMUCOSAL | Status: DC | PRN
  Filled 2014-10-14: qty 177

## 2014-10-14 MED ORDER — HYDROMORPHONE HCL 1 MG/ML IJ SOLN
0.2500 mg | INTRAMUSCULAR | Status: DC | PRN
  Administered 2014-10-14 (×2): 0.25 mg via INTRAVENOUS
  Administered 2014-10-14: 0.5 mg via INTRAVENOUS

## 2014-10-14 MED ORDER — POLYETHYLENE GLYCOL 3350 17 G PO PACK: 17.0000 g | PACK | Freq: Every day | ORAL | Status: DC | PRN

## 2014-10-14 MED ORDER — ONDANSETRON HCL 4 MG/2ML IJ SOLN: 4.0000 mg | Freq: Four times a day (QID) | INTRAMUSCULAR | Status: DC | PRN

## 2014-10-14 MED ORDER — METOPROLOL TARTRATE 12.5 MG HALF TABLET
12.5000 mg | ORAL_TABLET | Freq: Two times a day (BID) | ORAL | Status: DC
  Administered 2014-10-14 – 2014-10-16 (×4): 12.5 mg via ORAL
  Filled 2014-10-14 (×5): qty 1

## 2014-10-14 MED ORDER — ALBUTEROL SULFATE (2.5 MG/3ML) 0.083% IN NEBU: 3.0000 mL | INHALATION_SOLUTION | Freq: Four times a day (QID) | RESPIRATORY_TRACT | Status: DC | PRN

## 2014-10-14 MED ORDER — APIXABAN 2.5 MG PO TABS
2.5000 mg | ORAL_TABLET | Freq: Two times a day (BID) | ORAL | Status: DC
  Administered 2014-10-15 – 2014-10-16 (×3): 2.5 mg via ORAL
  Filled 2014-10-14 (×4): qty 1

## 2014-10-14 MED ORDER — BUPIVACAINE HCL (PF) 0.25 % IJ SOLN
INTRAMUSCULAR | Status: AC
  Filled 2014-10-14: qty 30

## 2014-10-14 MED ORDER — FENTANYL CITRATE (PF) 250 MCG/5ML IJ SOLN
INTRAMUSCULAR | Status: AC
  Filled 2014-10-14: qty 5

## 2014-10-14 MED ORDER — TRANEXAMIC ACID 1000 MG/10ML IV SOLN
2000.0000 mg | Freq: Once | INTRAVENOUS | Status: AC
  Administered 2014-10-14: 2000 mg via TOPICAL
  Filled 2014-10-14: qty 20

## 2014-10-14 MED ORDER — FERROUS SULFATE 325 (65 FE) MG PO TABS
325.0000 mg | ORAL_TABLET | Freq: Three times a day (TID) | ORAL | Status: DC
  Administered 2014-10-15 – 2014-10-16 (×3): 325 mg via ORAL
  Filled 2014-10-14 (×8): qty 1

## 2014-10-14 MED ORDER — SODIUM CHLORIDE 0.9 % IR SOLN
Status: DC | PRN
  Administered 2014-10-14: 500 mL

## 2014-10-14 MED ORDER — EPHEDRINE SULFATE 50 MG/ML IJ SOLN
INTRAMUSCULAR | Status: AC
  Filled 2014-10-14: qty 1

## 2014-10-14 MED ORDER — SODIUM CHLORIDE 0.9 % IV SOLN
10.0000 mg | INTRAVENOUS | Status: DC | PRN
  Administered 2014-10-14: 40 ug/min via INTRAVENOUS

## 2014-10-14 SURGICAL SUPPLY — 76 items
BAG DECANTER FOR FLEXI CONT (MISCELLANEOUS) IMPLANT
BAG SPEC THK2 15X12 ZIP CLS (MISCELLANEOUS)
BAG ZIPLOCK 12X15 (MISCELLANEOUS) IMPLANT
BANDAGE ELASTIC 4 VELCRO ST LF (GAUZE/BANDAGES/DRESSINGS) ×3 IMPLANT
BANDAGE ELASTIC 6 VELCRO ST LF (GAUZE/BANDAGES/DRESSINGS) ×3 IMPLANT
BANDAGE ESMARK 6X9 LF (GAUZE/BANDAGES/DRESSINGS) ×1 IMPLANT
BLADE SAG 18X100X1.27 (BLADE) ×3 IMPLANT
BLADE SAW SGTL 11.0X1.19X90.0M (BLADE) ×3 IMPLANT
BNDG CMPR 9X6 STRL LF SNTH (GAUZE/BANDAGES/DRESSINGS) ×1
BNDG ESMARK 6X9 LF (GAUZE/BANDAGES/DRESSINGS) ×3
BONE CEMENT GENTAMICIN (Cement) ×6 IMPLANT
CAP KNEE TOTAL 3 SIGMA ×2 IMPLANT
CEMENT BONE GENTAMICIN 40 (Cement) ×2 IMPLANT
CUFF TOURN SGL QUICK 34 (TOURNIQUET CUFF) ×3
CUFF TRNQT CYL 34X4X40X1 (TOURNIQUET CUFF) ×1 IMPLANT
DECANTER SPIKE VIAL GLASS SM (MISCELLANEOUS) ×3 IMPLANT
DRAPE EXTREMITY T 121X128X90 (DRAPE) ×3 IMPLANT
DRAPE INCISE IOBAN 66X45 STRL (DRAPES) IMPLANT
DRAPE POUCH INSTRU U-SHP 10X18 (DRAPES) ×3 IMPLANT
DRAPE SHEET LG 3/4 BI-LAMINATE (DRAPES) ×3 IMPLANT
DRAPE U-SHAPE 47X51 STRL (DRAPES) ×3 IMPLANT
DRSG AQUACEL AG ADV 3.5X10 (GAUZE/BANDAGES/DRESSINGS) ×3 IMPLANT
DRSG PAD ABDOMINAL 8X10 ST (GAUZE/BANDAGES/DRESSINGS) IMPLANT
DRSG TEGADERM 4X4.75 (GAUZE/BANDAGES/DRESSINGS) ×3 IMPLANT
DURAPREP 26ML APPLICATOR (WOUND CARE) ×3 IMPLANT
ELECT REM PT RETURN 9FT ADLT (ELECTROSURGICAL) ×3
ELECTRODE REM PT RTRN 9FT ADLT (ELECTROSURGICAL) ×1 IMPLANT
EVACUATOR 1/8 PVC DRAIN (DRAIN) ×3 IMPLANT
FACESHIELD WRAPAROUND (MASK) ×15 IMPLANT
FACESHIELD WRAPAROUND OR TEAM (MASK) ×5 IMPLANT
GAUZE SPONGE 2X2 8PLY STRL LF (GAUZE/BANDAGES/DRESSINGS) ×1 IMPLANT
GLOVE BIOGEL PI IND STRL 6.5 (GLOVE) ×1 IMPLANT
GLOVE BIOGEL PI IND STRL 8 (GLOVE) ×1 IMPLANT
GLOVE BIOGEL PI INDICATOR 6.5 (GLOVE) ×2
GLOVE BIOGEL PI INDICATOR 8 (GLOVE) ×2
GLOVE ECLIPSE 8.0 STRL XLNG CF (GLOVE) ×6 IMPLANT
GLOVE SURG SS PI 6.5 STRL IVOR (GLOVE) ×3 IMPLANT
GOWN STRL REUS W/TWL LRG LVL3 (GOWN DISPOSABLE) ×3 IMPLANT
GOWN STRL REUS W/TWL XL LVL3 (GOWN DISPOSABLE) ×3 IMPLANT
HANDPIECE INTERPULSE COAX TIP (DISPOSABLE) ×3
IMMOBILIZER KNEE 20 (SOFTGOODS) ×3
IMMOBILIZER KNEE 20 THIGH 36 (SOFTGOODS) ×1 IMPLANT
KIT BASIN OR (CUSTOM PROCEDURE TRAY) ×3 IMPLANT
LIQUID BAND (GAUZE/BANDAGES/DRESSINGS) ×3 IMPLANT
MANIFOLD NEPTUNE II (INSTRUMENTS) ×3 IMPLANT
NDL SAFETY ECLIPSE 18X1.5 (NEEDLE) ×2 IMPLANT
NEEDLE HYPO 18GX1.5 SHARP (NEEDLE) ×6
NEEDLE HYPO 22GX1.5 SAFETY (NEEDLE) ×3 IMPLANT
NS IRRIG 1000ML POUR BTL (IV SOLUTION) IMPLANT
PACK TOTAL JOINT (CUSTOM PROCEDURE TRAY) ×3 IMPLANT
PADDING CAST COTTON 6X4 STRL (CAST SUPPLIES) IMPLANT
PEN SKIN MARKING BROAD (MISCELLANEOUS) ×3 IMPLANT
POSITIONER SURGICAL ARM (MISCELLANEOUS) ×3 IMPLANT
SET HNDPC FAN SPRY TIP SCT (DISPOSABLE) ×1 IMPLANT
SET PAD KNEE POSITIONER (MISCELLANEOUS) ×3 IMPLANT
SPONGE GAUZE 2X2 STER 10/PKG (GAUZE/BANDAGES/DRESSINGS) ×2
SPONGE LAP 18X18 X RAY DECT (DISPOSABLE) IMPLANT
SPONGE SURGIFOAM ABS GEL 100 (HEMOSTASIS) ×3 IMPLANT
STAPLER VISISTAT 35W (STAPLE) IMPLANT
SUCTION FRAZIER 12FR DISP (SUCTIONS) ×3 IMPLANT
SUT BONE WAX W31G (SUTURE) ×3 IMPLANT
SUT MNCRL AB 4-0 PS2 18 (SUTURE) ×3 IMPLANT
SUT VIC AB 1 CT1 27 (SUTURE) ×6
SUT VIC AB 1 CT1 27XBRD ANTBC (SUTURE) ×2 IMPLANT
SUT VIC AB 2-0 CT1 27 (SUTURE) ×9
SUT VIC AB 2-0 CT1 TAPERPNT 27 (SUTURE) ×3 IMPLANT
SUT VLOC 180 0 24IN GS25 (SUTURE) ×3 IMPLANT
SYR 20CC LL (SYRINGE) ×6 IMPLANT
SYR 50ML LL SCALE MARK (SYRINGE) ×3 IMPLANT
TOWEL OR 17X26 10 PK STRL BLUE (TOWEL DISPOSABLE) ×3 IMPLANT
TOWEL OR NON WOVEN STRL DISP B (DISPOSABLE) IMPLANT
TOWER CARTRIDGE SMART MIX (DISPOSABLE) ×3 IMPLANT
TRAY FOLEY W/METER SILVER 14FR (SET/KITS/TRAYS/PACK) ×3 IMPLANT
WATER STERILE IRR 1500ML POUR (IV SOLUTION) ×3 IMPLANT
WRAP KNEE MAXI GEL POST OP (GAUZE/BANDAGES/DRESSINGS) ×3 IMPLANT
YANKAUER SUCT BULB TIP 10FT TU (MISCELLANEOUS) ×3 IMPLANT

## 2014-10-14 NOTE — Anesthesia Preprocedure Evaluation (Addendum)
Anesthesia Evaluation  Patient identified by MRN, date of birth, ID band Patient awake    Reviewed: Allergy & Precautions, H&P , NPO status , Patient's Chart, lab work & pertinent test results, reviewed documented beta blocker date and time   Airway Mallampati: II  TM Distance: >3 FB Neck ROM: full    Dental  (+) Dental Advisory Given, Chipped Left upper front chipped:   Pulmonary neg pulmonary ROS, asthma ,  breath sounds clear to auscultation  Pulmonary exam normal       Cardiovascular Exercise Tolerance: Good hypertension, Pt. on home beta blockers and Pt. on medications + Past MI Normal cardiovascular exam+ dysrhythmias Atrial Fibrillation Rhythm:regular Rate:Normal  MI 6/15 minor. Takotsubo syndrome. Echo with good EF. ECG - SB   Neuro/Psych Autonomic neuropathy  Neuromuscular disease negative neurological ROS  negative psych ROS   GI/Hepatic negative GI ROS, Neg liver ROS,   Endo/Other  negative endocrine ROSdiabetes, Well Controlled, Type 2  Renal/GU negative Renal ROS  negative genitourinary   Musculoskeletal   Abdominal   Peds  Hematology negative hematology ROS (+)   Anesthesia Other Findings Hypoosmolality, hyponatremia, disorder of magnesium metabolism. Na 133  Reproductive/Obstetrics negative OB ROS                           Anesthesia Physical Anesthesia Plan  ASA: III  Anesthesia Plan: Spinal   Post-op Pain Management:    Induction:   Airway Management Planned:   Additional Equipment:   Intra-op Plan:   Post-operative Plan:   Informed Consent: I have reviewed the patients History and Physical, chart, labs and discussed the procedure including the risks, benefits and alternatives for the proposed anesthesia with the patient or authorized representative who has indicated his/her understanding and acceptance.   Dental Advisory Given  Plan Discussed with: CRNA and  Surgeon  Anesthesia Plan Comments:         Anesthesia Quick Evaluation

## 2014-10-14 NOTE — Anesthesia Procedure Notes (Signed)
Spinal Patient location during procedure: OR Start time: 10/14/2014 11:50 AM End time: 10/14/2014 11:57 AM Staffing Resident/CRNA: Dion Saucier E Performed by: resident/CRNA  Preanesthetic Checklist Completed: patient identified, site marked, surgical consent, pre-op evaluation, timeout performed, IV checked, risks and benefits discussed and monitors and equipment checked Spinal Block Patient position: sitting Prep: Betadine, ChloraPrep and site prepped and draped Patient monitoring: heart rate, continuous pulse ox and blood pressure Approach: midline Location: L3-4 Injection technique: single-shot Needle Needle type: Spinocan  Needle gauge: 22 G Needle length: 9 cm Additional Notes Kit expiration date checked, negative heme, paresthesias, good flow.  Tolerated well.

## 2014-10-14 NOTE — Plan of Care (Signed)
Problem: Consults Goal: Diagnosis- Total Joint Replacement Left total knee     

## 2014-10-14 NOTE — Progress Notes (Signed)
Utilization review completed.  

## 2014-10-14 NOTE — Brief Op Note (Signed)
10/14/2014  1:27 PM  PATIENT:  Kelly Boone  81 y.o. female  PRE-OPERATIVE DIAGNOSIS:  lefft knee Primary osteoarthritis  POST-OPERATIVE DIAGNOSIS:  lefft knee Primary  osteoarthritis  PROCEDURE:  Procedure(s): LEFT TOTAL KNEE ARTHROPLASTY (Left)  SURGEON:  Surgeon(s) and Role:    * Latanya Maudlin, MD - Primary  PHYSICIAN ASSISTANT:Amber Lisbon PA   ASSISTANTS: Ardeen Jourdain PA  ANESTHESIA:   general  EBL:  Total I/O In: -  Out: 350 [Urine:350]  BLOOD ADMINISTERED:none  DRAINS: (One) Hemovact drain(s) in the Left with  Suction Open   LOCAL MEDICATIONS USED:  MARCAINE 20ccof 0.25% Plain and Exparel20cc mixed with 20cc 64f Normal Saline     SPECIMEN:  No Specimen  DISPOSITION OF SPECIMEN:  N/A  COUNTS:  YES  TOURNIQUET:  * Missing tourniquet times found for documented tourniquets in log:  [id number] *  DICTATION: .Other Dictation: Dictation Number [id number]  PLAN OF CARE: Admit to inpatient   PATIENT DISPOSITION:  Stable in OR   Delay start of Pharmacological VTE agent (>24hrs) due to surgical blood loss or risk of bleeding: yes

## 2014-10-14 NOTE — Transfer of Care (Signed)
Immediate Anesthesia Transfer of Care Note  Patient: Kelly Boone  Procedure(s) Performed: Procedure(s): LEFT TOTAL KNEE ARTHROPLASTY (Left)  Patient Location: PACU  Anesthesia Type:Spinal  Level of Consciousness: awake, alert , oriented and patient cooperative  Airway & Oxygen Therapy: Patient Spontanous Breathing and Patient connected to face mask oxygen  Post-op Assessment: Report given to RN  Post vital signs: Reviewed and stable  Last Vitals:  Filed Vitals:   10/14/14 0918  BP: 161/61  Pulse: 60  Temp: 36.6 C  Resp: 16    Complications: No apparent anesthesia complications

## 2014-10-14 NOTE — Anesthesia Postprocedure Evaluation (Signed)
  Anesthesia Post-op Note  Patient: Kelly Boone  Procedure(s) Performed: Procedure(s) (LRB): LEFT TOTAL KNEE ARTHROPLASTY (Left)  Patient Location: PACU  Anesthesia Type: Spinal  Level of Consciousness: awake and alert   Airway and Oxygen Therapy: Patient Spontanous Breathing  Post-op Pain: mild  Post-op Assessment: Post-op Vital signs reviewed, Patient's Cardiovascular Status Stable, Respiratory Function Stable, Patent Airway and No signs of Nausea or vomiting  Last Vitals:  Filed Vitals:   10/14/14 1630  BP: 152/67  Pulse: 67  Temp:   Resp: 18    Post-op Vital Signs: stable   Complications: No apparent anesthesia complications

## 2014-10-14 NOTE — Interval H&P Note (Signed)
History and Physical Interval Note:  10/14/2014 11:09 AM  Kelly Boone  has presented today for surgery, with the diagnosis of lefft knee osteoarthritis  The various methods of treatment have been discussed with the patient and family. After consideration of risks, benefits and other options for treatment, the patient has consented to  Procedure(s): LEFT TOTAL KNEE ARTHROPLASTY (Left) as a surgical intervention .  The patient's history has been reviewed, patient examined, no change in status, stable for surgery.  I have reviewed the patient's chart and labs.  Questions were answered to the patient's satisfaction.     Isak Sotomayor A

## 2014-10-15 ENCOUNTER — Encounter (HOSPITAL_COMMUNITY): Payer: Self-pay | Admitting: Orthopedic Surgery

## 2014-10-15 LAB — BASIC METABOLIC PANEL
Anion gap: 7 (ref 5–15)
BUN: 14 mg/dL (ref 6–20)
CO2: 23 mmol/L (ref 22–32)
Calcium: 10.5 mg/dL — ABNORMAL HIGH (ref 8.9–10.3)
Chloride: 101 mmol/L (ref 101–111)
Creatinine, Ser: 0.76 mg/dL (ref 0.44–1.00)
GFR calc Af Amer: >60 mL/min (ref >60)
GFR calc non Af Amer: >60 mL/min (ref >60)
Glucose, Bld: 138 mg/dL — ABNORMAL HIGH (ref 65–99)
Potassium: 4.4 mmol/L (ref 3.5–5.1)
Sodium: 131 mmol/L — ABNORMAL LOW (ref 135–145)

## 2014-10-15 LAB — CBC
HCT: 29.6 % — ABNORMAL LOW (ref 36.0–46.0)
Hemoglobin: 10.1 g/dL — ABNORMAL LOW (ref 12.0–15.0)
MCH: 29.9 pg (ref 26.0–34.0)
MCHC: 34.1 g/dL (ref 30.0–36.0)
MCV: 87.6 fL (ref 78.0–100.0)
Platelets: 291 10*3/uL (ref 150–400)
RBC: 3.38 MIL/uL — ABNORMAL LOW (ref 3.87–5.11)
RDW: 12.3 % (ref 11.5–15.5)
WBC: 13.5 10*3/uL — ABNORMAL HIGH (ref 4.0–10.5)

## 2014-10-15 NOTE — Progress Notes (Signed)
Physical Therapy Treatment Patient Details Name: Kelly Boone MRN: [id number] DOB: 10-13-1931 Today's Date: 10/15/2014    History of Present Illness s/p  L TKA    PT Comments    Patient is progressing well. Increased pain this PM.medicated and iced L  Knee.  Follow Up Recommendations  Home health PT;Supervision/Assistance - 24 hour     Equipment Recommendations  None recommended by PT    Recommendations for Other Services       Precautions / Restrictions Precautions Precautions: Knee;Fall    Mobility  Bed Mobility         Supine to sit: Min guard Sit to supine: Min guard      Transfers Overall transfer level: Needs assistance Equipment used: Rolling walker (2 wheeled) Transfers: Sit to/from Stand Sit to Stand: Min guard         General transfer comment: cues for hand placement  Ambulation/Gait Ambulation/Gait assistance: Min assist Ambulation Distance (Feet): 90 Feet Assistive device: Standard walker       General Gait Details: cues for sequence. . Did not require KI, using RW as SW and doing well    Financial trader Rankin (Stroke Patients Only)       Balance                                    Cognition Arousal/Alertness: Awake/alert                          Exercises Total Joint Exercises Ankle Circles/Pumps: AROM;Supine;Both;10 reps Quad Sets: AROM;Supine;Both Heel Slides: AAROM;Left;10 reps;Supine Straight Leg Raises: AAROM;Left;10 reps;Supine Long Arc Quad: AAROM;Left;10 reps;Seated    General Comments        Pertinent Vitals/Pain Pain Score: 5  Pain Location: medial knee Pain Descriptors / Indicators: Aching;Tightness Pain Intervention(s): Limited activity within patient's tolerance;Monitored during session;Premedicated before session;Ice applied    Home Living                      Prior Function            PT Goals (current goals can now  be found in the care plan section) Acute Rehab PT Goals Patient Stated Goal: get back to walking PT Goal Formulation: With patient/family Time For Goal Achievement: 10/22/14 Potential to Achieve Goals: Good Progress towards PT goals: Progressing toward goals    Frequency  7X/week    PT Plan Current plan remains appropriate    Co-evaluation             End of Session   Activity Tolerance: Patient tolerated treatment well Patient left: in bed;with call bell/phone within reach;with family/visitor present     Time: 3532-9924 PT Time Calculation (min) (ACUTE ONLY): 26 min  Charges:  $Gait Training: 8-22 mins $Therapeutic Exercise: 8-22 mins                    G Codes:      Kelly Boone 10/15/2014, 1:43 PM [name] PT [phone]

## 2014-10-15 NOTE — Evaluation (Signed)
Physical Therapy Evaluation Patient Details Name: Kelly Boone MRN: [id number] DOB: Apr 02, 1932 Today's Date: 10/15/2014   History of Present Illness  s/p  L TKA  Clinical Impression  Patient is progressing well. AMBULATED 61' without need for KI. Patient will benefit from PT while in acute care to address problems listed in note below.    Follow Up Recommendations Home health PT;Supervision/Assistance - 24 hour    Equipment Recommendations   (patient can use SW)    Recommendations for Other Services       Precautions / Restrictions Precautions Precautions: Knee;Fall Required Braces or Orthoses: Knee Immobilizer - Left Restrictions Weight Bearing Restrictions: No      Mobility  Bed Mobility Overal bed mobility: Needs Assistance Bed Mobility: Sit to Supine     Supine to sit: Min assist Sit to supine: Min guard   General bed mobility comments: able to place LLE onto bed.  Transfers Overall transfer level: Needs assistance Equipment used: Rolling walker (2 wheeled) Transfers: Sit to/from Stand Sit to Stand: Min assist         General transfer comment: assist to steady, cues for haqnd and L leg position  Ambulation/Gait Ambulation/Gait assistance: Min assist Ambulation Distance (Feet): 80 Feet Assistive device: Rolling walker (2 wheeled) Gait Pattern/deviations: Step-to pattern;Step-through pattern;Decreased step length - left;Antalgic     General Gait Details: cues for sequence. patient states that the L leg is tending to rotate inward. Did not require KI  Financial trader Rankin (Stroke Patients Only)       Balance                                             Pertinent Vitals/Pain Pain Assessment: 0-10 Pain Score: 2  Pain Location: L knee, incr to 4 Pain Descriptors / Indicators: Discomfort;Sore Pain Intervention(s): Monitored during session;Premedicated before session;Ice applied     Home Living Family/patient expects to be discharged to:: Private residence Living Arrangements: Spouse/significant other             Home Equipment: Nurse, children's - 4 wheels      Prior Function Level of Independence: Independent               Hand Dominance        Extremity/Trunk Assessment   Upper Extremity Assessment: Overall WFL for tasks assessed (L shoulder hurts but wfls; h/o R RCR, WFLs)           Lower Extremity Assessment: LLE deficits/detail   LLE Deficits / Details: 50 knee flexion, able to perform SLR  Cervical / Trunk Assessment: Kyphotic  Communication   Communication: No difficulties  Cognition Arousal/Alertness: Awake/alert Behavior During Therapy: WFL for tasks assessed/performed Overall Cognitive Status: Within Functional Limits for tasks assessed                      General Comments      Exercises Total Joint Exercises Ankle Circles/Pumps: AROM;Supine;Both;10 reps Quad Sets: AROM;Supine;Both Short Arc Quad: AROM;Supine;Left;10 reps Heel Slides: AROM;Supine;Left;10 reps Hip ABduction/ADduction: Supine;Left;10 reps;AAROM Straight Leg Raises: Supine;Left;10 reps;AAROM      Assessment/Plan    PT Assessment Patient needs continued PT services  PT Diagnosis Difficulty walking;Acute pain   PT Problem List Decreased strength;Decreased range of motion;Decreased mobility;Decreased activity tolerance;Pain;Decreased  knowledge of precautions;Decreased safety awareness;Decreased knowledge of use of DME  PT Treatment Interventions DME instruction;Gait training;Stair training;Functional mobility training;Therapeutic activities;Therapeutic exercise;Patient/family education   PT Goals (Current goals can be found in the Care Plan section) Acute Rehab PT Goals Patient Stated Goal: get back to walking PT Goal Formulation: With patient/family Time For Goal Achievement: 10/22/14 Potential to Achieve Goals: Good     Frequency 7X/week   Barriers to discharge        Co-evaluation               End of Session   Activity Tolerance: Patient tolerated treatment well Patient left: in bed;with call bell/phone within reach;with family/visitor present Nurse Communication: Mobility status         Time: 8413-2440 PT Time Calculation (min) (ACUTE ONLY): 26 min   Charges:   PT Evaluation $Initial PT Evaluation Tier I: 1 Procedure PT Treatments $Gait Training: 8-22 mins   PT G Codes:        [name] 10/15/2014, 9:44 AM [name] PT [phone]

## 2014-10-15 NOTE — Op Note (Signed)
NAMELEGEND, PECORE                 ACCOUNT NO.:  [id number]  MEDICAL RECORD NO.:  [id number]  LOCATION:  9242                         FACILITY:  [facility]  PHYSICIAN:  Kipp Brood. Chester Romero, M.D.DATE OF BIRTH:  Aug 30, 1931  DATE OF PROCEDURE:  10/14/2014 DATE OF DISCHARGE:                              OPERATIVE REPORT   SURGEON:  Kipp Brood. Gladstone Lighter, M.D.  ASSISTANT:  Ardeen Jourdain, [place]  PREOPERATIVE DIAGNOSIS:  Severe primary osteoarthritis of the left knee.  POSTOPERATIVE DIAGNOSIS:  Severe primary osteoarthritis of the left knee.  OPERATION:  Left total knee arthroplasty utilizing DePuy system, all 3 components were cemented in simultaneously.  Gentamicin was used in the cement.  Sizes used were 2.5 left posterior cruciate sacrificing femoral component.  The tibial tray size 2.5, the insert was size 2.5 rotating platform 15 mm thickness.  DESCRIPTION OF PROCEDURE:  Under general anesthesia, routine orthopedic prepping and draping of the left lower extremity was carried out. Tourniquet was used.  Appropriate time-out was first carried out.  I also marked the appropriate left leg in the holding area.  At this time, the left leg was exsanguinated with an Esmarch, tourniquet was elevated to 325 mmHg.  Following that, the knee was flexed and placed into the [facility] knee holder.  Anterior approach to the knee was carried out.  Two flaps were created.  I then carried out a median parapatellar incision in usual fashion.  At this time, the patella was reflected laterally. We did a synovectomy, we removed all the spurs.  I then made my initial drill hole with intercondylar notch in the femur.  I then inserted the canal finder and thoroughly irrigated out the canal.  I removed 11 mm thickness off the distal femur.  Next, the femur was measured to be a size 2.5 left.  I then did my anterior and posterior and chamfering cuts for a size 2.5 left femoral component.  Medial and lateral  meniscectomies were carried out prior to that as well as excised the anterior and posterior cruciate ligaments. After the femur was prepared, we did the anterior, posterior, and chamfering cuts.  We then directed attention to the tibial plateau. Drill hole was made in the tibial plateau in the usual fashion.  We had a 2.5 tray lined up at the thigh.  We then utilized the canal finder and thoroughly irrigated out the canal.  We removed approximately 6 mm thickness off the affected medial side of the tibia.  At this particular time, we then inserted our lamina spreaders and made sure there were no posterior spurs, there were none.  We then inserted our spacer blocks and finally selected a 15 mm thickness spacer block.  At this time, we then inserted the trial components.  We had excellent fit.  Then, we did our resurfacing procedure on the patella in usual fashion for a 38 patella, 3 drill holes were made in the articular surface of the patella.  All components were then removed.  We thoroughly water picked out the knee.  I injected 20 mL of 0.25% Marcaine plain into the soft tissue.  We then cemented all 3 components in simultaneously, and as  I said gentamicin was used in the cement.  After the cement was hardened, we removed loose pieces of cement, then I water picked out the knee as well to make sure there were other loose fragments.  I then inserted thrombin-soaked Gelfoam, inserted my permanent spacer, which is a rotating platform polyethylene liner measured 2.5 x 15 mm thickness.  We reduced the knee and had excellent function.  I then inserted the Hemovac drain and closed the knee in layers in the usual fashion.  Note, as I mentioned before the above sizes that were used; patella was a size 38, polyethylene femur size 2.5 left, the tibial tray size 2.5, insert was a size 2.5 x 15 mm thickness.  Preop, she had 2 g of IV Ancef and tranexamic acid.           ______________________________ Kipp Brood. Gladstone Lighter, M.D.     RAG/MEDQ  D:  10/14/2014  T:  10/15/2014  Job:  [id number]

## 2014-10-15 NOTE — Evaluation (Signed)
Occupational Therapy Evaluation Patient Details Name: Kelly Boone MRN: [id number] DOB: [birth year] Today's Date: 10/15/2014    History of Present Illness s/p  L TKA   Clinical Impression   This 81 year old female was admitted for the above surgery.  She will benefit from skilled OT in acute to further assess DME and improve toileting with supervision level goals.  She currently needs min A for this.    Follow Up Recommendations  Supervision/Assistance - 24 hour    Equipment Recommendations   (likely none--will further assess.  Has high commode with bar)    Recommendations for Other Services       Precautions / Restrictions Precautions Precautions: Knee;Fall Required Braces or Orthoses: Knee Immobilizer - Left Restrictions Weight Bearing Restrictions: No      Mobility Bed Mobility Overal bed mobility: Needs Assistance Bed Mobility: Supine to Sit     Supine to sit: Min assist     General bed mobility comments: assist for LLE  Transfers Overall transfer level: Needs assistance Equipment used: Rolling walker (2 wheeled) Transfers: Sit to/from Stand Sit to Stand: Min assist         General transfer comment: assist to steady    Balance                                            ADL Overall ADL's : Needs assistance/impaired     Grooming: Wash/dry face;Wash/dry hands;Set up;Sitting   Upper Body Bathing: Set up;Sitting   Lower Body Bathing: Minimal assistance;Sit to/from stand   Upper Body Dressing : Set up;Sitting   Lower Body Dressing: Maximal assistance;Sit to/from stand   Toilet Transfer: Minimal assistance;Stand-pivot;RW (to recliner)             General ADL Comments: performed adl from eob, dressed in her own pajamas.  Discussed sponge bathing initially and having HHPT review tub transfer with her.  She has a seat and bars but must step over tub and has doors.  Discussed use of reacher, which she has, for LB adls.  Husband  can also assist     Vision     Perception     Praxis      Pertinent Vitals/Pain Pain Assessment: 0-10 Pain Score: 2  Pain Location: L knee Pain Descriptors / Indicators: Sore Pain Intervention(s): Limited activity within patient's tolerance;Monitored during session;Premedicated before session;Repositioned     Hand Dominance     Extremity/Trunk Assessment Upper Extremity Assessment Upper Extremity Assessment: Overall WFL for tasks assessed (L shoulder hurts but wfls; h/o R RCR, WFLs)           Communication Communication Communication: No difficulties   Cognition Arousal/Alertness: Awake/alert Behavior During Therapy: WFL for tasks assessed/performed Overall Cognitive Status: Within Functional Limits for tasks assessed                     General Comments       Exercises       Shoulder Instructions      Home Living Family/patient expects to be discharged to:: Private residence Living Arrangements: Spouse/significant other                 Bathroom Shower/Tub: Tub/shower unit Shower/tub characteristics: Door Bathroom Toilet: Handicapped height     Home Equipment: Grab bars - toilet;Grab bars - tub/shower  Prior Functioning/Environment Level of Independence: Independent             OT Diagnosis: Generalized weakness   OT Problem List: Decreased strength;Pain;Decreased knowledge of use of DME or AE   OT Treatment/Interventions: Self-care/ADL training;DME and/or AE instruction;Patient/family education    OT Goals(Current goals can be found in the care plan section) Acute Rehab OT Goals Patient Stated Goal: get back to walking OT Goal Formulation: With patient Time For Goal Achievement: 10/22/14 Potential to Achieve Goals: Good ADL Goals Pt Will Perform Grooming: with supervision;standing Pt Will Transfer to Toilet: with supervision;ambulating;grab bars (high commode) Pt Will Perform Toileting - Clothing Manipulation and  hygiene: with supervision;sit to/from stand Additional ADL Goal #1: pt will use reacher for LB adls with set up/supervision  OT Frequency: Min 2X/week   Barriers to D/C:            Co-evaluation              End of Session    Activity Tolerance: Patient tolerated treatment well Patient left: in chair;with call bell/phone within reach;with family/visitor present   Time: 4166-0630 OT Time Calculation (min): 34 min Charges:  OT General Charges $OT Visit: 1 Procedure OT Evaluation $Initial OT Evaluation Tier I: 1 Procedure OT Treatments $Self Care/Home Management : 8-22 mins G-Codes:    Josef Tourigny 11-03-14, 8:47 AM Lesle Chris, OTR/L [phone] 11/03/14

## 2014-10-15 NOTE — Progress Notes (Signed)
Subjective: 1 Day Post-Op Procedure(s) (LRB): LEFT TOTAL KNEE ARTHROPLASTY (Left) Patient reports pain as moderate.   Patient seen in rounds with Dr. Wynelle Link. Patient is well, and has had no acute complaints or problems. No issues overnight. No SOB or chest pain. Reports that she did get some rest last night.    Objective: Vital signs in last 24 hours: Temp:  [97.4 F (36.3 C)-98 F (36.7 C)] 97.9 F (36.6 C) (07/14 0500) Pulse Rate:  [58-72] 61 (07/14 0500) Resp:  [14-25] 16 (07/14 0500) BP: (116-161)/(47-67) 132/51 mmHg (07/14 0500) SpO2:  [96 %-100 %] 100 % (07/14 0642) Weight:  [65.318 kg (144 lb)] 65.318 kg (144 lb) (07/13 1530)  Intake/Output from previous day:  Intake/Output Summary (Last 24 hours) at 10/15/14 0750 Last data filed at 10/15/14 0642  Gross per 24 hour  Intake 3863.34 ml  Output   3210 ml  Net 653.34 ml     Labs:  Recent Labs  10/15/14 0413  HGB 10.1*    Recent Labs  10/15/14 0413  WBC 13.5*  RBC 3.38*  HCT 29.6*  PLT 291    Recent Labs  10/15/14 0413  NA 131*  K 4.4  CL 101  CO2 23  BUN 14  CREATININE 0.76  GLUCOSE 138*  CALCIUM 10.5*   EXAM General - Patient is Alert and Oriented Extremity - Neurologically intact Intact pulses distally Dorsiflexion/Plantar flexion intact Compartment soft Dressing - dressing C/D/I Motor Function - intact, moving foot and toes well on exam.  Hemovac pulled without difficulty.  Past Medical History  Diagnosis Date  . A-fib     irregular heartbeat  . DM (diabetes mellitus) 2004  . Hypertension 1997  . Hyperlipemia 1997  . Allergic rhinitis   . Anxiety   . Asthma   . Atopic dermatitis   . Benign essential hypertension   . Edema   . Special screening for malignant neoplasms, colon   . Esophagitis   . Headache   . Hearing loss   . Encounter for long-term (current) use of other medications   . Hypercalcemia   . Sinus bradycardia   . Disorders of magnesium metabolism   .  Hyposmolality and/or hyponatremia   . Pain in joint, lower leg   . Myalgia and myositis, unspecified   . Need for prophylactic vaccination and inoculation against influenza   . Other syndromes affecting cervical region   . Osteoporosis, unspecified   . Pain in joint, shoulder region   . Restless legs syndrome (RLS)   . Screening for depression   . Varicose veins of lower extremities with inflammation   . Unspecified essential hypertension   . Peripheral autonomic neuropathy in disorders classified elsewhere(337.1)   . Type 2 diabetes mellitus with autonomic neuropathy   . Urinary tract infection, site not specified   . Other screening mammogram   . Unspecified vitamin D deficiency   . Myocardial infarction     June 2015, was told caused by stress  . Pneumonia     as infant  . Depression   . Chronic kidney disease     hx of kidney stones as teenager  . GERD (gastroesophageal reflux disease)   . Arthritis     Assessment/Plan: 1 Day Post-Op Procedure(s) (LRB): LEFT TOTAL KNEE ARTHROPLASTY (Left) Active Problems:   History of total knee arthroplasty  Estimated body mass index is 26.33 kg/(m^2) as calculated from the following:   Height as of this encounter: 5\' 2"  (1.575 m).  Weight as of this encounter: 65.318 kg (144 lb). Advance diet Up with therapy D/C IV fluids when tolerating POs well  DVT Prophylaxis - resumed Eliquis Weight-Bearing as tolerated  D/C O2 and Pulse OX and try on Room Air  Will continue PT today. Plan for DC home tomorrow with [place], PA-C Orthopaedic Surgery 10/15/2014, 7:50 AM

## 2014-10-16 LAB — BASIC METABOLIC PANEL
Anion gap: 3 — ABNORMAL LOW (ref 5–15)
BUN: 16 mg/dL (ref 6–20)
CO2: 26 mmol/L (ref 22–32)
Calcium: 9.7 mg/dL (ref 8.9–10.3)
Chloride: 98 mmol/L — ABNORMAL LOW (ref 101–111)
Creatinine, Ser: 0.8 mg/dL (ref 0.44–1.00)
GFR calc Af Amer: >60 mL/min (ref >60)
GFR calc non Af Amer: >60 mL/min (ref >60)
Glucose, Bld: 114 mg/dL — ABNORMAL HIGH (ref 65–99)
Potassium: 4.3 mmol/L (ref 3.5–5.1)
Sodium: 127 mmol/L — ABNORMAL LOW (ref 135–145)

## 2014-10-16 LAB — CBC
HCT: 27.5 % — ABNORMAL LOW (ref 36.0–46.0)
Hemoglobin: 9.3 g/dL — ABNORMAL LOW (ref 12.0–15.0)
MCH: 30.1 pg (ref 26.0–34.0)
MCHC: 33.8 g/dL (ref 30.0–36.0)
MCV: 89 fL (ref 78.0–100.0)
Platelets: 290 10*3/uL (ref 150–400)
RBC: 3.09 MIL/uL — ABNORMAL LOW (ref 3.87–5.11)
RDW: 12.9 % (ref 11.5–15.5)
WBC: 12.1 10*3/uL — ABNORMAL HIGH (ref 4.0–10.5)

## 2014-10-16 MED ORDER — FERROUS SULFATE 325 (65 FE) MG PO TABS: 325.0000 mg | ORAL_TABLET | Freq: Two times a day (BID) | ORAL | Status: DC

## 2014-10-16 MED ORDER — METHOCARBAMOL 500 MG PO TABS: 500.0000 mg | ORAL_TABLET | Freq: Four times a day (QID) | ORAL | Status: DC | PRN

## 2014-10-16 MED ORDER — OXYCODONE-ACETAMINOPHEN 5-325 MG PO TABS: 0.5000 | ORAL_TABLET | ORAL | Status: DC | PRN

## 2014-10-16 NOTE — Progress Notes (Signed)
Physical Therapy Treatment Patient Details Name: TEKEYAH SANTIAGO MRN: [id number] DOB: June 12, 1931 Today's Date: 10/16/2014    History of Present Illness s/p  L TKA    PT Comments    Patient reports feeling poorly after meds this AM. Patient  Will practice steps and DC.  Follow Up Recommendations  Home health PT;Supervision/Assistance - 24 hour     Equipment Recommendations  None recommended by PT    Recommendations for Other Services       Precautions / Restrictions Precautions Precautions: Knee;Fall Restrictions Weight Bearing Restrictions: No    Mobility  Bed Mobility Overal bed mobility: Needs Assistance Bed Mobility: Supine to Sit     Supine to sit: Supervision     General bed mobility comments: oob  Transfers   Equipment used: Rolling walker (2 wheeled) Transfers: Sit to/from Stand Sit to Stand: Supervision         General transfer comment: cues for hand placement  Ambulation/Gait Ambulation/Gait assistance: Supervision Ambulation Distance (Feet): 20 Feet (x 2)   Gait Pattern/deviations: Step-to pattern     General Gait Details: cues for sequence. . Did not require KI,    Stairs            Wheelchair Mobility    Modified Rankin (Stroke Patients Only)       Balance                                    Cognition Arousal/Alertness: Awake/alert Behavior During Therapy: WFL for tasks assessed/performed Overall Cognitive Status: Within Functional Limits for tasks assessed                      Exercises Total Joint Exercises Ankle Circles/Pumps: AROM;Supine;Both;10 reps Quad Sets: AROM;Supine;Both Towel Squeeze: AROM;Both;10 reps;Supine Short Arc Quad: AROM;Supine;Left;10 reps Heel Slides: AAROM;Left;10 reps;Supine Hip ABduction/ADduction: Supine;Left;10 reps;AAROM Straight Leg Raises: AAROM;Left;10 reps;Supine Long Arc Quad: AAROM;Left;10 reps;Seated Goniometric ROM: 10-50 L knee    General Comments         Pertinent Vitals/Pain Pain Score: 0-No pain Pain Location: L knee Pain Descriptors / Indicators: Aching Pain Intervention(s): RN gave pain meds during session;Ice applied;Limited activity within patient's tolerance    Home Living                      Prior Function            PT Goals (current goals can now be found in the care plan section) Progress towards PT goals: Progressing toward goals    Frequency  7X/week    PT Plan Current plan remains appropriate    Co-evaluation             End of Session   Activity Tolerance: Patient tolerated treatment well Patient left: in chair;with call bell/phone within reach     Time: 0835-0910 PT Time Calculation (min) (ACUTE ONLY): 35 min  Charges:  $Gait Training: 8-22 mins $Therapeutic Exercise: 8-22 mins                    G Codes:      [name] 10/16/2014, 1:14 PM

## 2014-10-16 NOTE — Progress Notes (Signed)
Physical Therapy Treatment Patient Details Name: Kelly Boone MRN: [id number] DOB: 1931-07-27 Today's Date: 10/16/2014    History of Present Illness s/p  L TKA    PT Comments    Patient nauseated. RN informed. Practiced steps.  Follow Up Recommendations  Home health PT;Supervision/Assistance - 24 hour     Equipment Recommendations  None recommended by PT    Recommendations for Other Services       Precautions / Restrictions Precautions Precautions: Knee;Fall Restrictions Weight Bearing Restrictions: No    Mobility  Bed Mobility Overal bed mobility: Needs Assistance Bed Mobility: Supine to Sit     Supine to sit: Supervision     General bed mobility comments: oob  Transfers   Equipment used: Rolling walker (2 wheeled) Transfers: Sit to/from Stand Sit to Stand: Supervision         General transfer comment: cues for hand placement  Ambulation/Gait Ambulation/Gait assistance: Supervision Ambulation Distance (Feet): 20 Feet Assistive device: Rolling walker (2 wheeled) Gait Pattern/deviations: Step-to pattern     General Gait Details: cues for sequence. . Did not require KI,    Stairs Stairs: Yes Stairs assistance: Min assist Stair Management: No rails;Step to pattern;Backwards;With walker Number of Stairs: 3 General stair comments: daughter opresent for instrucetion  Wheelchair Mobility    Modified Rankin (Stroke Patients Only)       Balance                                    Cognition Arousal/Alertness: Awake/alert Behavior During Therapy: WFL for tasks assessed/performed Overall Cognitive Status: Within Functional Limits for tasks assessed                      Exercises Total Joint Exercises Ankle Circles/Pumps: AROM;Supine;Both;10 reps Quad Sets: AROM;Supine;Both Towel Squeeze: AROM;Both;10 reps;Supine Short Arc Quad: AROM;Supine;Left;10 reps Heel Slides: AAROM;Left;10 reps;Supine Hip ABduction/ADduction:  Supine;Left;10 reps;AAROM Straight Leg Raises: AAROM;Left;10 reps;Supine Long Arc Quad: AAROM;Left;10 reps;Seated Goniometric ROM: 10-50 L knee    General Comments        Pertinent Vitals/Pain Pain Score: 0-No pain Pain Location: L knee Pain Descriptors / Indicators: Aching Pain Intervention(s): RN gave pain meds during session;Ice applied;Limited activity within patient's tolerance    Home Living                      Prior Function            PT Goals (current goals can now be found in the care plan section) Progress towards PT goals: Progressing toward goals    Frequency  7X/week    PT Plan Current plan remains appropriate    Co-evaluation             End of Session   Activity Tolerance: Patient tolerated treatment well Patient left: in chair;with call bell/phone within reach     Time: 1035-1045 PT Time Calculation (min) (ACUTE ONLY): 10 min  Charges:  $Gait Training: 8-22 mins $Therapeutic Exercise: 8-22 mins                    G Codes:      [name] 10/16/2014, 1:16 PM

## 2014-10-16 NOTE — Care Management Note (Signed)
Case Management Note  Patient Details  Name: Kelly Boone MRN: [id number] Date of Birth: April 18, 1931  Subjective/Objective:                  Knee replacement  Action/Plan: Arrange home health  Expected Discharge Date:   10/16/14              Expected Discharge Plan:  [place]  In-House Referral:     Discharge planning Services  CM Consult  Post Acute Care Choice:  Home Health Choice offered to:  Patient  DME Arranged:    DME Agency:     HH Arranged:  PT HH Agency:  [place]  Status of Service:  Completed, signed off  Medicare Important Message Given:    Date Medicare IM Given:    Medicare IM give by:    Date Additional Medicare IM Given:    Additional Medicare Important Message give by:     If discussed at [place] of Stay Meetings, dates discussed:    Additional Comments: Met with patient in room 10/15/14 and discussed discharge to home health process.  All questions answered Rolland Porter, RN 10/16/2014, 8:27 AM

## 2014-10-16 NOTE — Progress Notes (Signed)
Occupational Therapy Treatment Patient Details Name: Kelly Boone MRN: [id number] DOB: Apr 17, 1931 Today's Date: 10/16/2014    History of present illness s/p  L TKA   OT comments  Pt is making good progress.  Plans discharge home today; goals very close to being met  Follow Up Recommendations  Supervision/Assistance - 24 hour    Equipment Recommendations  None recommended by OT    Recommendations for Other Services      Precautions / Restrictions Precautions Precautions: Knee;Fall Restrictions Weight Bearing Restrictions: No       Mobility Bed Mobility               General bed mobility comments: oob  Transfers   Equipment used: Rolling walker (2 wheeled) Transfers: Sit to/from Stand Sit to Stand: Supervision              Balance                                   ADL       Grooming: Wash/dry hands;Supervision/safety;Standing               Lower Body Dressing: Moderate assistance;Sit to/from stand (able to don pants without AE, partial help for socks/shoes)   Toilet Transfer: Min guard;Ambulation;RW;BSC             General ADL Comments: sat on high commode and simulated bil rails; she has this at home      Vision                     Perception     Praxis      Cognition   Behavior During Therapy: [facility] for tasks assessed/performed Overall Cognitive Status: Within Functional Limits for tasks assessed                       Extremity/Trunk Assessment               Exercises     Shoulder Instructions       General Comments      Pertinent Vitals/ Pain       Pain Score: 6  Pain Location: L knee Pain Descriptors / Indicators: Aching Pain Intervention(s): Limited activity within patient's tolerance;Monitored during session;Repositioned;Ice applied (encouraged to call for pain meds, if needed)  Home Living                                          Prior  Functioning/Environment              Frequency       Progress Toward Goals  OT Goals(current goals can now be found in the care plan section)  Progress towards OT goals: Progressing toward goals     Plan Discharge plan remains appropriate    Co-evaluation                 End of Session     Activity Tolerance Patient tolerated treatment well   Patient Left in chair;with call bell/phone within reach;with family/visitor present   Nurse Communication          Time: 6433-2951 OT Time Calculation (min): 18 min  Charges: OT General Charges $OT Visit: 1 Procedure OT Treatments $Self Care/Home Management : 8-22 mins  Shalita Notte 10/16/2014, 10:21  AM  Lesle Chris, OTR/L [phone] 10/16/2014

## 2014-10-16 NOTE — Progress Notes (Signed)
Subjective: 2 Days Post-Op Procedure(s) (LRB): LEFT TOTAL KNEE ARTHROPLASTY (Left) Patient reports pain as 1 on 0-10 scale.Doing well,will DC.HBg9.3    Objective: Vital signs in last 24 hours: Temp:  [97.9 F (36.6 C)-98.4 F (36.9 C)] 98.4 F (36.9 C) (07/15 0625) Pulse Rate:  [65-68] 66 (07/15 0625) BP: (127-139)/(45-53) 139/45 mmHg (07/15 0625) SpO2:  [97 %-100 %] 97 % (07/15 0625)  Intake/Output from previous day: 07/14 0701 - 07/15 0700 In: 810 [P.O.:600; I.V.:210] Out: 1650 [Urine:1650] Intake/Output this shift: Total I/O In: 120 [P.O.:120] Out: 900 [Urine:900]   Recent Labs  10/15/14 0413 10/16/14 0452  HGB 10.1* 9.3*    Recent Labs  10/15/14 0413 10/16/14 0452  WBC 13.5* 12.1*  RBC 3.38* 3.09*  HCT 29.6* 27.5*  PLT 291 290    Recent Labs  10/15/14 0413 10/16/14 0452  NA 131* 127*  K 4.4 4.3  CL 101 98*  CO2 23 26  BUN 14 16  CREATININE 0.76 0.80  GLUCOSE 138* 114*  CALCIUM 10.5* 9.7   No results for input(s): LABPT, INR in the last 72 hours.  Neurologically intact  Assessment/Plan: 2 Days Post-Op Procedure(s) (LRB): LEFT TOTAL KNEE ARTHROPLASTY (Left) Up with therapy Discharge home with home health  Kelly Boone A 10/16/2014, 6:52 AM

## 2014-10-16 NOTE — Discharge Instructions (Signed)
INSTRUCTIONS AFTER JOINT REPLACEMENT  ° °Remove items at home which could result in a fall. This includes throw rugs or furniture in walking pathways °ICE to the affected joint every three hours while awake for 30 minutes at a time, for at least the first 3-5 days, and then as needed for pain and swelling.  Continue to use ice for pain and swelling. You may notice swelling that will progress down to the foot and ankle.  This is normal after surgery.  Elevate your leg when you are not up walking on it.   °Continue to use the breathing machine you got in the hospital (incentive spirometer) which will help keep your temperature down.  It is common for your temperature to cycle up and down following surgery, especially at night when you are not up moving around and exerting yourself.  The breathing machine keeps your lungs expanded and your temperature down. ° ° °DIET:  As you were doing prior to hospitalization, we recommend a well-balanced diet. ° °DRESSING / WOUND CARE / SHOWERING ° °Keep the surgical dressing until follow up.  The dressing is water proof, so you can shower without any extra covering.  IF THE DRESSING FALLS OFF or the wound gets wet inside, change the dressing with sterile gauze.  Please use good hand washing techniques before changing the dressing.  Do not use any lotions or creams on the incision until instructed by your surgeon.   ° °ACTIVITY ° °Increase activity slowly as tolerated, but follow the weight bearing instructions below.   °No driving for 6 weeks or until further direction given by your physician.  You cannot drive while taking narcotics.  °No lifting or carrying greater than 10 lbs. until further directed by your surgeon. °Avoid periods of inactivity such as sitting longer than an hour when not asleep. This helps prevent blood clots.  °You may return to work once you are authorized by your doctor.  ° ° ° °WEIGHT BEARING  ° °Weight bearing as tolerated with assist device (walker, cane,  etc) as directed, use it as long as suggested by your surgeon or therapist, typically at least 4-6 weeks. ° ° °EXERCISES ° °Results after joint replacement surgery are often greatly improved when you follow the exercise, range of motion and muscle strengthening exercises prescribed by your doctor. Safety measures are also important to protect the joint from further injury. Any time any of these exercises cause you to have increased pain or swelling, decrease what you are doing until you are comfortable again and then slowly increase them. If you have problems or questions, call your caregiver or physical therapist for advice.  ° °Rehabilitation is important following a joint replacement. After just a few days of immobilization, the muscles of the leg can become weakened and shrink (atrophy).  These exercises are designed to build up the tone and strength of the thigh and leg muscles and to improve motion. Often times heat used for twenty to thirty minutes before working out will loosen up your tissues and help with improving the range of motion but do not use heat for the first two weeks following surgery (sometimes heat can increase post-operative swelling).  ° °These exercises can be done on a training (exercise) mat, on the floor, on a table or on a bed. Use whatever works the best and is most comfortable for you.    Use music or television while you are exercising so that the exercises are a pleasant break in your   day. This will make your life better with the exercises acting as a break in your routine that you can look forward to.   Perform all exercises about fifteen times, three times per day or as directed.  You should exercise both the operative leg and the other leg as well.   Exercises include:   Quad Sets - Tighten up the muscle on the front of the thigh (Quad) and hold for 5-10 seconds.   Straight Leg Raises - With your knee straight (if you were given a brace, keep it on), lift the leg to 60  degrees, hold for 3 seconds, and slowly lower the leg.  Perform this exercise against resistance later as your leg gets stronger.  Leg Slides: Lying on your back, slowly slide your foot toward your buttocks, bending your knee up off the floor (only go as far as is comfortable). Then slowly slide your foot back down until your leg is flat on the floor again.  Angel Wings: Lying on your back spread your legs to the side as far apart as you can without causing discomfort.  Hamstring Strength:  Lying on your back, push your heel against the floor with your leg straight by tightening up the muscles of your buttocks.  Repeat, but this time bend your knee to a comfortable angle, and push your heel against the floor.  You may put a pillow under the heel to make it more comfortable if necessary.   A rehabilitation program following joint replacement surgery can speed recovery and prevent re-injury in the future due to weakened muscles. Contact your doctor or a physical therapist for more information on knee rehabilitation.    CONSTIPATION  Constipation is defined medically as fewer than three stools per week and severe constipation as less than one stool per week.  Even if you have a regular bowel pattern at home, your normal regimen is likely to be disrupted due to multiple reasons following surgery.  Combination of anesthesia, postoperative narcotics, change in appetite and fluid intake all can affect your bowels.   YOU MUST use at least one of the following options; they are listed in order of increasing strength to get the job done.  They are all available over the counter, and you may need to use some, POSSIBLY even all of these options:    Drink plenty of fluids (prune juice may be helpful) and high fiber foods Colace 100 mg by mouth twice a day  Senokot for constipation as directed and as needed Dulcolax (bisacodyl), take with full glass of water  Miralax (polyethylene glycol) once or twice a day as  needed.  If you have tried all these things and are unable to have a bowel movement in the first 3-4 days after surgery call either your surgeon or your primary doctor.    If you experience loose stools or diarrhea, hold the medications until you stool forms back up.  If your symptoms do not get better within 1 week or if they get worse, check with your doctor.  If you experience "the worst abdominal pain ever" or develop nausea or vomiting, please contact the office immediately for further recommendations for treatment.   ITCHING:  If you experience itching with your medications, try taking only a single pain pill, or even half a pain pill at a time.  You can also use Benadryl over the counter for itching or also to help with sleep.   TED HOSE STOCKINGS:  Use stockings on  both legs until for at least 2 weeks or as directed by physician office. They may be removed at night for sleeping.  MEDICATIONS:  See your medication summary on the After Visit Summary that nursing will review with you.  You may have some home medications which will be placed on hold until you complete the course of blood thinner medication.  It is important for you to complete the blood thinner medication as prescribed.  PRECAUTIONS:  If you experience chest pain or shortness of breath - call 911 immediately for transfer to the hospital emergency department.   If you develop a fever greater that 101 F, purulent drainage from wound, increased redness or drainage from wound, foul odor from the wound/dressing, or calf pain - CONTACT YOUR SURGEON.                                                   FOLLOW-UP APPOINTMENTS:  If you do not already have a post-op appointment, please call the office for an appointment to be seen by your surgeon.  Guidelines for how soon to be seen are listed in your After Visit Summary, but are typically between 1-4 weeks after surgery.  OTHER INSTRUCTIONS:    MAKE SURE YOU:  Understand these  instructions.  Get help right away if you are not doing well or get worse.    Thank you for letting us be a part of your medical care team.  It is a privilege we respect greatly.  We hope these instructions will help you stay on track for a fast and full recovery!

## 2014-10-17 DIAGNOSIS — I10 Essential (primary) hypertension: Secondary | ICD-10-CM | POA: Diagnosis not present

## 2014-10-17 DIAGNOSIS — Z471 Aftercare following joint replacement surgery: Secondary | ICD-10-CM | POA: Diagnosis not present

## 2014-10-17 DIAGNOSIS — G2581 Restless legs syndrome: Secondary | ICD-10-CM | POA: Diagnosis not present

## 2014-10-17 DIAGNOSIS — J45909 Unspecified asthma, uncomplicated: Secondary | ICD-10-CM | POA: Diagnosis not present

## 2014-10-17 DIAGNOSIS — E1143 Type 2 diabetes mellitus with diabetic autonomic (poly)neuropathy: Secondary | ICD-10-CM | POA: Diagnosis not present

## 2014-10-17 DIAGNOSIS — I4891 Unspecified atrial fibrillation: Secondary | ICD-10-CM | POA: Diagnosis not present

## 2014-10-19 NOTE — Discharge Summary (Signed)
Physician Discharge Summary   Patient ID: Kelly Boone MRN: [id number] DOB/AGE: October 14, 1931 81 y.o.  Admit date: 10/14/2014 Discharge date: 10/16/2014  Primary Diagnosis: primary osteoarthritis, left knee  Admission Diagnoses:  Past Medical History  Diagnosis Date  . A-fib     irregular heartbeat  . DM (diabetes mellitus) 2004  . Hypertension 1997  . Hyperlipemia 1997  . Allergic rhinitis   . Anxiety   . Asthma   . Atopic dermatitis   . Benign essential hypertension   . Edema   . Special screening for malignant neoplasms, colon   . Esophagitis   . Headache   . Hearing loss   . Encounter for long-term (current) use of other medications   . Hypercalcemia   . Sinus bradycardia   . Disorders of magnesium metabolism   . Hyposmolality and/or hyponatremia   . Pain in joint, lower leg   . Myalgia and myositis, unspecified   . Need for prophylactic vaccination and inoculation against influenza   . Other syndromes affecting cervical region   . Osteoporosis, unspecified   . Pain in joint, shoulder region   . Restless legs syndrome (RLS)   . Screening for depression   . Varicose veins of lower extremities with inflammation   . Unspecified essential hypertension   . Peripheral autonomic neuropathy in disorders classified elsewhere(337.1)   . Type 2 diabetes mellitus with autonomic neuropathy   . Urinary tract infection, site not specified   . Other screening mammogram   . Unspecified vitamin D deficiency   . Myocardial infarction     June 2015, was told caused by stress  . Pneumonia     as infant  . Depression   . Chronic kidney disease     hx of kidney stones as teenager  . GERD (gastroesophageal reflux disease)   . Arthritis    Discharge Diagnoses:   Active Problems:   History of total knee arthroplasty  Estimated body mass index is 26.33 kg/(m^2) as calculated from the following:   Height as of this encounter: 5' 2"  (1.575 m).   Weight as of this encounter: 65.318  kg (144 lb).  Procedure:  Procedure(s) (LRB): LEFT TOTAL KNEE ARTHROPLASTY (Left)   Consults: None  HPI: Kelly Boone, 81 y.o. female, has a history of pain and functional disability in the left knee due to arthritis and has failed non-surgical conservative treatments for greater than 12 weeks to includeNSAID's and/or analgesics, corticosteriod injections, viscosupplementation injections and activity modification. Onset of symptoms was gradual, starting 5 years ago with gradually worsening course since that time. The patient noted no past surgery on the left knee(s). Patient currently rates pain in the left knee(s) at 7 out of 10 with activity. Patient has night pain, worsening of pain with activity and weight bearing, pain that interferes with activities of daily living, pain with passive range of motion, crepitus and joint swelling. Patient has evidence of periarticular osteophytes and joint space narrowing by imaging studies. There is no active infection.  Laboratory Data: Admission on 10/14/2014, Discharged on 10/16/2014  Component Date Value Ref Range Status  . Glucose-Capillary 10/14/2014 96  65 - 99 mg/dL Final  . Comment 1 10/14/2014 Notify RN   Final  . Glucose-Capillary 10/14/2014 81  65 - 99 mg/dL Final  . Comment 1 10/14/2014 Notify RN   Final  . Comment 2 10/14/2014 Document in Chart   Final  . WBC 10/15/2014 13.5* 4.0 - 10.5 K/uL Final  . RBC 10/15/2014  3.38* 3.87 - 5.11 MIL/uL Final  . Hemoglobin 10/15/2014 10.1* 12.0 - 15.0 g/dL Final  . HCT 10/15/2014 29.6* 36.0 - 46.0 % Final  . MCV 10/15/2014 87.6  78.0 - 100.0 fL Final  . MCH 10/15/2014 29.9  26.0 - 34.0 pg Final  . MCHC 10/15/2014 34.1  30.0 - 36.0 g/dL Final  . RDW 10/15/2014 12.3  11.5 - 15.5 % Final  . Platelets 10/15/2014 291  150 - 400 K/uL Final  . Sodium 10/15/2014 131* 135 - 145 mmol/L Final   REPEATED TO VERIFY  . Potassium 10/15/2014 4.4  3.5 - 5.1 mmol/L Final   REPEATED TO VERIFY  . Chloride  10/15/2014 101  101 - 111 mmol/L Final   REPEATED TO VERIFY  . CO2 10/15/2014 23  22 - 32 mmol/L Final   REPEATED TO VERIFY  . Glucose, Bld 10/15/2014 138* 65 - 99 mg/dL Final  . BUN 10/15/2014 14  6 - 20 mg/dL Final  . Creatinine, Ser 10/15/2014 0.76  0.44 - 1.00 mg/dL Final  . Calcium 10/15/2014 10.5* 8.9 - 10.3 mg/dL Final   REPEATED TO VERIFY  . GFR calc non Af Amer 10/15/2014 >60  >60 mL/min Final  . GFR calc Af Amer 10/15/2014 >60  >60 mL/min Final   Comment: (NOTE) The eGFR has been calculated using the CKD EPI equation. This calculation has not been validated in all clinical situations. eGFR's persistently <60 mL/min signify possible Chronic Kidney Disease.   . Anion gap 10/15/2014 7  5 - 15 Final   REPEATED TO VERIFY  . WBC 10/16/2014 12.1* 4.0 - 10.5 K/uL Final  . RBC 10/16/2014 3.09* 3.87 - 5.11 MIL/uL Final  . Hemoglobin 10/16/2014 9.3* 12.0 - 15.0 g/dL Final  . HCT 10/16/2014 27.5* 36.0 - 46.0 % Final  . MCV 10/16/2014 89.0  78.0 - 100.0 fL Final  . MCH 10/16/2014 30.1  26.0 - 34.0 pg Final  . MCHC 10/16/2014 33.8  30.0 - 36.0 g/dL Final  . RDW 10/16/2014 12.9  11.5 - 15.5 % Final  . Platelets 10/16/2014 290  150 - 400 K/uL Final  . Sodium 10/16/2014 127* 135 - 145 mmol/L Final  . Potassium 10/16/2014 4.3  3.5 - 5.1 mmol/L Final  . Chloride 10/16/2014 98* 101 - 111 mmol/L Final  . CO2 10/16/2014 26  22 - 32 mmol/L Final  . Glucose, Bld 10/16/2014 114* 65 - 99 mg/dL Final  . BUN 10/16/2014 16  6 - 20 mg/dL Final  . Creatinine, Ser 10/16/2014 0.80  0.44 - 1.00 mg/dL Final  . Calcium 10/16/2014 9.7  8.9 - 10.3 mg/dL Final  . GFR calc non Af Amer 10/16/2014 >60  >60 mL/min Final  . GFR calc Af Amer 10/16/2014 >60  >60 mL/min Final   Comment: (NOTE) The eGFR has been calculated using the CKD EPI equation. This calculation has not been validated in all clinical situations. eGFR's persistently <60 mL/min signify possible Chronic Kidney Disease.   Georgiann Hahn gap  10/16/2014 3* 5 - 15 Final  Hospital Outpatient Visit on 10/08/2014  Component Date Value Ref Range Status  . aPTT 10/08/2014 35  24 - 37 seconds Final  . WBC 10/08/2014 6.9  4.0 - 10.5 K/uL Final  . RBC 10/08/2014 3.77* 3.87 - 5.11 MIL/uL Final  . Hemoglobin 10/08/2014 11.6* 12.0 - 15.0 g/dL Final  . HCT 10/08/2014 33.5* 36.0 - 46.0 % Final  . MCV 10/08/2014 88.9  78.0 - 100.0 fL Final  . MCH  10/08/2014 30.8  26.0 - 34.0 pg Final  . MCHC 10/08/2014 34.6  30.0 - 36.0 g/dL Final  . RDW 10/08/2014 12.5  11.5 - 15.5 % Final  . Platelets 10/08/2014 332  150 - 400 K/uL Final  . Neutrophils Relative % 10/08/2014 61  43 - 77 % Final  . Neutro Abs 10/08/2014 4.2  1.7 - 7.7 K/uL Final  . Lymphocytes Relative 10/08/2014 29  12 - 46 % Final  . Lymphs Abs 10/08/2014 2.0  0.7 - 4.0 K/uL Final  . Monocytes Relative 10/08/2014 9  3 - 12 % Final  . Monocytes Absolute 10/08/2014 0.6  0.1 - 1.0 K/uL Final  . Eosinophils Relative 10/08/2014 1  0 - 5 % Final  . Eosinophils Absolute 10/08/2014 0.1  0.0 - 0.7 K/uL Final  . Basophils Relative 10/08/2014 0  0 - 1 % Final  . Basophils Absolute 10/08/2014 0.0  0.0 - 0.1 K/uL Final  . Sodium 10/08/2014 133* 135 - 145 mmol/L Final  . Potassium 10/08/2014 4.9  3.5 - 5.1 mmol/L Final  . Chloride 10/08/2014 100* 101 - 111 mmol/L Final  . CO2 10/08/2014 27  22 - 32 mmol/L Final  . Glucose, Bld 10/08/2014 106* 65 - 99 mg/dL Final  . BUN 10/08/2014 20  6 - 20 mg/dL Final  . Creatinine, Ser 10/08/2014 0.88  0.44 - 1.00 mg/dL Final  . Calcium 10/08/2014 11.4* 8.9 - 10.3 mg/dL Final  . Total Protein 10/08/2014 7.7  6.5 - 8.1 g/dL Final  . Albumin 10/08/2014 4.3  3.5 - 5.0 g/dL Final  . AST 10/08/2014 26  15 - 41 U/L Final  . ALT 10/08/2014 19  14 - 54 U/L Final  . Alkaline Phosphatase 10/08/2014 85  38 - 126 U/L Final  . Total Bilirubin 10/08/2014 0.6  0.3 - 1.2 mg/dL Final  . GFR calc non Af Amer 10/08/2014 >60  >60 mL/min Final  . GFR calc Af Amer 10/08/2014 >60   >60 mL/min Final   Comment: (NOTE) The eGFR has been calculated using the CKD EPI equation. This calculation has not been validated in all clinical situations. eGFR's persistently <60 mL/min signify possible Chronic Kidney Disease.   . Anion gap 10/08/2014 6  5 - 15 Final  . Prothrombin Time 10/08/2014 14.9  11.6 - 15.2 seconds Final  . INR 10/08/2014 1.15  0.00 - 1.49 Final  . ABO/RH(D) 10/08/2014 A POS   Final  . Antibody Screen 10/08/2014 NEG   Final  . Sample Expiration 10/08/2014 10/17/2014   Final  . Color, Urine 10/08/2014 YELLOW  YELLOW Final  . APPearance 10/08/2014 CLOUDY* CLEAR Final  . Specific Gravity, Urine 10/08/2014 1.010  1.005 - 1.030 Final  . pH 10/08/2014 6.5  5.0 - 8.0 Final  . Glucose, UA 10/08/2014 NEGATIVE  NEGATIVE mg/dL Final  . Hgb urine dipstick 10/08/2014 NEGATIVE  NEGATIVE Final  . Bilirubin Urine 10/08/2014 NEGATIVE  NEGATIVE Final  . Ketones, ur 10/08/2014 NEGATIVE  NEGATIVE mg/dL Final  . Protein, ur 10/08/2014 NEGATIVE  NEGATIVE mg/dL Final  . Urobilinogen, UA 10/08/2014 0.2  0.0 - 1.0 mg/dL Final  . Nitrite 10/08/2014 POSITIVE* NEGATIVE Final  . Leukocytes, UA 10/08/2014 LARGE* NEGATIVE Final  . MRSA, PCR 10/08/2014 NEGATIVE  NEGATIVE Final  . Staphylococcus aureus 10/08/2014 NEGATIVE  NEGATIVE Final   Comment:        The Xpert SA Assay (FDA approved for NASAL specimens in patients over 3 years of age), is one component of a  comprehensive surveillance program.  Test performance has been validated by [facility] for patients greater than or equal to 56 year old. It is not intended to diagnose infection nor to guide or monitor treatment.   . Squamous Epithelial / LPF 10/08/2014 RARE  RARE Final  . WBC, UA 10/08/2014 TOO NUMEROUS TO COUNT  <3 WBC/hpf Final  . RBC / HPF 10/08/2014 0-2  <3 RBC/hpf Final  . Bacteria, UA 10/08/2014 MANY* RARE Final  . ABO/RH(D) 10/08/2014 A POS   Final     X-Rays:Dg Chest 2 View  10/08/2014   CLINICAL DATA:   Preoperative examination prior to left total knee arthroplasty on July 13th, history of asthma diabetes and hypertension  EXAM: CHEST  2 VIEW  COMPARISON:  Portable chest x-ray of April 17, 2014  FINDINGS: The lungs are mildly hyperinflated with hemidiaphragm flattening. There is no focal infiltrate. There is no pleural effusion. The heart and pulmonary vascularity are normal. The mediastinum is normal in width. There is degenerative disc change at multiple thoracic levels. The patient has undergone previous right sided rotator cuff repair.  IMPRESSION: Mild hyperinflation either voluntary or secondary to known reactive airway disease. There is no active cardiopulmonary disease.   Electronically Signed   By: David  Martinique M.D.   On: 10/08/2014 12:55     Hospital Course: Kelly Boone is a 81 y.o. who was admitted to [facility]. They were brought to the operating room on 10/14/2014 and underwent Procedure(s): LEFT TOTAL KNEE ARTHROPLASTY.  Patient tolerated the procedure well and was later transferred to the recovery room and then to the orthopaedic floor for postoperative care.  They were given PO and IV analgesics for pain control following their surgery.  They were given 24 hours of postoperative antibiotics of  Anti-infectives    Start     Dose/Rate Route Frequency Ordered Stop   10/14/14 1800  ceFAZolin (ANCEF) IVPB 1 g/50 mL premix     1 g 100 mL/hr over 30 Minutes Intravenous Every 6 hours 10/14/14 1536 10/15/14 0042   10/14/14 1226  polymyxin B 500,000 Units, bacitracin 50,000 Units in sodium chloride irrigation 0.9 % 500 mL irrigation  Status:  Discontinued       As needed 10/14/14 1226 10/14/14 1353   10/14/14 0947  ceFAZolin (ANCEF) IVPB 2 g/50 mL premix     2 g 100 mL/hr over 30 Minutes Intravenous On call to O.R. 10/14/14 4562 10/14/14 1145     and started on DVT prophylaxis in the form of resumption of her Eliquis.   PT and OT were ordered for total joint protocol.  Discharge  planning consulted to help with postop disposition and equipment needs.  Patient had a good night on the evening of surgery.  They started to get up OOB with therapy on day one. Hemovac drain was pulled without difficulty.  Continued to work with therapy into day two.  Tthe patient had progressed with therapy and meeting their goals. Patient was seen in rounds and was ready to go home.   Diet: Cardiac diet Activity:WBAT Follow-up:in 2 weeks Disposition - Home Discharged Condition: stable   Discharge Instructions    Call MD / Call 911    Complete by:  As directed   If you experience chest pain or shortness of breath, CALL 911 and be transported to the hospital emergency room.  If you develope a fever above 101 F, pus (white drainage) or increased drainage or redness at the wound, or  calf pain, call your surgeon's office.     Constipation Prevention    Complete by:  As directed   Drink plenty of fluids.  Prune juice may be helpful.  You may use a stool softener, such as Colace (over the counter) 100 mg twice a day.  Use MiraLax (over the counter) for constipation as needed.     Diet - low sodium heart healthy    Complete by:  As directed      Discharge instructions    Complete by:  As directed   INSTRUCTIONS AFTER JOINT REPLACEMENT   Remove items at home which could result in a fall. This includes throw rugs or furniture in walking pathways ICE to the affected joint every three hours while awake for 30 minutes at a time, for at least the first 3-5 days, and then as needed for pain and swelling.  Continue to use ice for pain and swelling. You may notice swelling that will progress down to the foot and ankle.  This is normal after surgery.  Elevate your leg when you are not up walking on it.   Continue to use the breathing machine you got in the hospital (incentive spirometer) which will help keep your temperature down.  It is common for your temperature to cycle up and down following surgery,  especially at night when you are not up moving around and exerting yourself.  The breathing machine keeps your lungs expanded and your temperature down.   DIET:  As you were doing prior to hospitalization, we recommend a well-balanced diet.  DRESSING / WOUND CARE / SHOWERING  Keep the surgical dressing until follow up.  The dressing is water proof, so you can shower without any extra covering.  IF THE DRESSING FALLS OFF or the wound gets wet inside, change the dressing with sterile gauze.  Please use good hand washing techniques before changing the dressing.  Do not use any lotions or creams on the incision until instructed by your surgeon.    ACTIVITY  Increase activity slowly as tolerated, but follow the weight bearing instructions below.   No driving for 6 weeks or until further direction given by your physician.  You cannot drive while taking narcotics.  No lifting or carrying greater than 10 lbs. until further directed by your surgeon. Avoid periods of inactivity such as sitting longer than an hour when not asleep. This helps prevent blood clots.  You may return to work once you are authorized by your doctor.     WEIGHT BEARING   Weight bearing as tolerated with assist device (walker, cane, etc) as directed, use it as long as suggested by your surgeon or therapist, typically at least 4-6 weeks.   EXERCISES  Results after joint replacement surgery are often greatly improved when you follow the exercise, range of motion and muscle strengthening exercises prescribed by your doctor. Safety measures are also important to protect the joint from further injury. Any time any of these exercises cause you to have increased pain or swelling, decrease what you are doing until you are comfortable again and then slowly increase them. If you have problems or questions, call your caregiver or physical therapist for advice.   Rehabilitation is important following a joint replacement. After just a few  days of immobilization, the muscles of the leg can become weakened and shrink (atrophy).  These exercises are designed to build up the tone and strength of the thigh and leg muscles and to improve motion. Often  times heat used for twenty to thirty minutes before working out will loosen up your tissues and help with improving the range of motion but do not use heat for the first two weeks following surgery (sometimes heat can increase post-operative swelling).   These exercises can be done on a training (exercise) mat, on the floor, on a table or on a bed. Use whatever works the best and is most comfortable for you.    Use music or television while you are exercising so that the exercises are a pleasant break in your day. This will make your life better with the exercises acting as a break in your routine that you can look forward to.   Perform all exercises about fifteen times, three times per day or as directed.  You should exercise both the operative leg and the other leg as well.   Exercises include:   Quad Sets - Tighten up the muscle on the front of the thigh (Quad) and hold for 5-10 seconds.   Straight Leg Raises - With your knee straight (if you were given a brace, keep it on), lift the leg to 60 degrees, hold for 3 seconds, and slowly lower the leg.  Perform this exercise against resistance later as your leg gets stronger.  Leg Slides: Lying on your back, slowly slide your foot toward your buttocks, bending your knee up off the floor (only go as far as is comfortable). Then slowly slide your foot back down until your leg is flat on the floor again.  Angel Wings: Lying on your back spread your legs to the side as far apart as you can without causing discomfort.  Hamstring Strength:  Lying on your back, push your heel against the floor with your leg straight by tightening up the muscles of your buttocks.  Repeat, but this time bend your knee to a comfortable angle, and push your heel against the  floor.  You may put a pillow under the heel to make it more comfortable if necessary.   A rehabilitation program following joint replacement surgery can speed recovery and prevent re-injury in the future due to weakened muscles. Contact your doctor or a physical therapist for more information on knee rehabilitation.    CONSTIPATION  Constipation is defined medically as fewer than three stools per week and severe constipation as less than one stool per week.  Even if you have a regular bowel pattern at home, your normal regimen is likely to be disrupted due to multiple reasons following surgery.  Combination of anesthesia, postoperative narcotics, change in appetite and fluid intake all can affect your bowels.   YOU MUST use at least one of the following options; they are listed in order of increasing strength to get the job done.  They are all available over the counter, and you may need to use some, POSSIBLY even all of these options:    Drink plenty of fluids (prune juice may be helpful) and high fiber foods Colace 100 mg by mouth twice a day  Senokot for constipation as directed and as needed Dulcolax (bisacodyl), take with full glass of water  Miralax (polyethylene glycol) once or twice a day as needed.  If you have tried all these things and are unable to have a bowel movement in the first 3-4 days after surgery call either your surgeon or your primary doctor.    If you experience loose stools or diarrhea, hold the medications until you stool forms back up.  If  your symptoms do not get better within 1 week or if they get worse, check with your doctor.  If you experience "the worst abdominal pain ever" or develop nausea or vomiting, please contact the office immediately for further recommendations for treatment.   ITCHING:  If you experience itching with your medications, try taking only a single pain pill, or even half a pain pill at a time.  You can also use Benadryl over the counter for  itching or also to help with sleep.   TED HOSE STOCKINGS:  Use stockings on both legs until for at least 2 weeks or as directed by physician office. They may be removed at night for sleeping.  MEDICATIONS:  See your medication summary on the "After Visit Summary" that nursing will review with you.  You may have some home medications which will be placed on hold until you complete the course of blood thinner medication.  It is important for you to complete the blood thinner medication as prescribed.  PRECAUTIONS:  If you experience chest pain or shortness of breath - call 911 immediately for transfer to the hospital emergency department.   If you develop a fever greater that 101 F, purulent drainage from wound, increased redness or drainage from wound, foul odor from the wound/dressing, or calf pain - CONTACT YOUR SURGEON.                                                   FOLLOW-UP APPOINTMENTS:  If you do not already have a post-op appointment, please call the office for an appointment to be seen by your surgeon.  Guidelines for how soon to be seen are listed in your "After Visit Summary", but are typically between 1-4 weeks after surgery.  OTHER INSTRUCTIONS:    MAKE SURE YOU:  Understand these instructions.  Get help right away if you are not doing well or get worse.    Thank you for letting us be a part of your medical care team.  It is a privilege we respect greatly.  We hope these instructions will help you stay on track for a fast and full recovery!     Increase activity slowly as tolerated    Complete by:  As directed             Medication List    TAKE these medications        albuterol 108 (90 BASE) MCG/ACT inhaler  Commonly known as:  PROVENTIL HFA;VENTOLIN HFA  Inhale 2 puffs into the lungs every 6 (six) hours as needed for wheezing or shortness of breath.     amLODipine 2.5 MG tablet  Commonly known as:  NORVASC  Take 1 tablet (2.5 mg total) by mouth daily.      apixaban 2.5 MG Tabs tablet  Commonly known as:  ELIQUIS  Take 1 tablet (2.5 mg total) by mouth 2 (two) times daily.     BIOTIN PO  Take 1,000 mg by mouth daily.     ciprofloxacin 500 MG tablet  Commonly known as:  CIPRO  Take 1,000 mg by mouth daily.     cyanocobalamin 500 MCG tablet  Take 500 mcg by mouth daily.     diazepam 5 MG tablet  Commonly known as:  VALIUM  Take 5 mg by mouth every 6 (six) hours as needed for  anxiety.     ferrous sulfate 325 (65 FE) MG tablet  Take 1 tablet (325 mg total) by mouth 2 (two) times daily with a meal.     furosemide 20 MG tablet  Commonly known as:  LASIX  Take 1 tablet (20 mg total) by mouth daily as needed for edema.     losartan-hydrochlorothiazide 50-12.5 MG per tablet  Commonly known as:  HYZAAR  Take 2 tablets by mouth daily.     Magnesium 250 MG Tabs  Take 2 tablets by mouth every morning.     methocarbamol 500 MG tablet  Commonly known as:  ROBAXIN  Take 1 tablet (500 mg total) by mouth every 6 (six) hours as needed for muscle spasms.     metoprolol tartrate 25 MG tablet  Commonly known as:  LOPRESSOR  Take 0.5 tablets (12.5 mg total) by mouth 2 (two) times daily.     OVER THE COUNTER MEDICATION  Take 5 mLs by mouth daily. restless leg syndrome. OPC-3     oxyCODONE-acetaminophen 5-325 MG per tablet  Commonly known as:  PERCOCET/ROXICET  Take 0.5-1 tablets by mouth every 4 (four) hours as needed for severe pain.     propranolol 10 MG tablet  Commonly known as:  INDERAL  Take 10 mg by mouth every 6 (six) hours as needed (increased heart rate and fibulation).     rOPINIRole 2 MG tablet  Commonly known as:  REQUIP  Take 2 mg by mouth 2 (two) times daily. Before supper and bed     VITAMIN D PO  Take 1,000 Units by mouth daily.           Follow-up Information    Follow up with [facility].   Why:  home health physical therapy   Contact information:   [address] [place]  [address] [phone]       Follow up with GIOFFRE,RONALD A, MD. Schedule an appointment as soon as possible for a visit in 2 weeks.   Specialty:  Orthopedic Surgery   Contact information:   [address] [place] [address] [phone]       Follow up with [facility]   Why:  Home health physical therapy   Contact information:   [address]  [place] [address] [phone]       Signed: Ardeen Jourdain, PA-C Orthopaedic Surgery 10/19/2014, 8:44 AM

## 2014-10-20 DIAGNOSIS — Z471 Aftercare following joint replacement surgery: Secondary | ICD-10-CM | POA: Diagnosis not present

## 2014-10-20 DIAGNOSIS — E1143 Type 2 diabetes mellitus with diabetic autonomic (poly)neuropathy: Secondary | ICD-10-CM | POA: Diagnosis not present

## 2014-10-20 DIAGNOSIS — G2581 Restless legs syndrome: Secondary | ICD-10-CM | POA: Diagnosis not present

## 2014-10-20 DIAGNOSIS — I10 Essential (primary) hypertension: Secondary | ICD-10-CM | POA: Diagnosis not present

## 2014-10-20 DIAGNOSIS — I4891 Unspecified atrial fibrillation: Secondary | ICD-10-CM | POA: Diagnosis not present

## 2014-10-20 DIAGNOSIS — J45909 Unspecified asthma, uncomplicated: Secondary | ICD-10-CM | POA: Diagnosis not present

## 2014-10-21 DIAGNOSIS — G2581 Restless legs syndrome: Secondary | ICD-10-CM | POA: Diagnosis not present

## 2014-10-21 DIAGNOSIS — E1143 Type 2 diabetes mellitus with diabetic autonomic (poly)neuropathy: Secondary | ICD-10-CM | POA: Diagnosis not present

## 2014-10-21 DIAGNOSIS — Z471 Aftercare following joint replacement surgery: Secondary | ICD-10-CM | POA: Diagnosis not present

## 2014-10-21 DIAGNOSIS — J45909 Unspecified asthma, uncomplicated: Secondary | ICD-10-CM | POA: Diagnosis not present

## 2014-10-21 DIAGNOSIS — I10 Essential (primary) hypertension: Secondary | ICD-10-CM | POA: Diagnosis not present

## 2014-10-21 DIAGNOSIS — I4891 Unspecified atrial fibrillation: Secondary | ICD-10-CM | POA: Diagnosis not present

## 2014-10-22 DIAGNOSIS — I10 Essential (primary) hypertension: Secondary | ICD-10-CM | POA: Diagnosis not present

## 2014-10-22 DIAGNOSIS — E1143 Type 2 diabetes mellitus with diabetic autonomic (poly)neuropathy: Secondary | ICD-10-CM | POA: Diagnosis not present

## 2014-10-22 DIAGNOSIS — J45909 Unspecified asthma, uncomplicated: Secondary | ICD-10-CM | POA: Diagnosis not present

## 2014-10-22 DIAGNOSIS — I4891 Unspecified atrial fibrillation: Secondary | ICD-10-CM | POA: Diagnosis not present

## 2014-10-22 DIAGNOSIS — G2581 Restless legs syndrome: Secondary | ICD-10-CM | POA: Diagnosis not present

## 2014-10-22 DIAGNOSIS — Z471 Aftercare following joint replacement surgery: Secondary | ICD-10-CM | POA: Diagnosis not present

## 2014-10-23 DIAGNOSIS — E1143 Type 2 diabetes mellitus with diabetic autonomic (poly)neuropathy: Secondary | ICD-10-CM | POA: Diagnosis not present

## 2014-10-23 DIAGNOSIS — I4891 Unspecified atrial fibrillation: Secondary | ICD-10-CM | POA: Diagnosis not present

## 2014-10-23 DIAGNOSIS — Z471 Aftercare following joint replacement surgery: Secondary | ICD-10-CM | POA: Diagnosis not present

## 2014-10-23 DIAGNOSIS — J45909 Unspecified asthma, uncomplicated: Secondary | ICD-10-CM | POA: Diagnosis not present

## 2014-10-23 DIAGNOSIS — I10 Essential (primary) hypertension: Secondary | ICD-10-CM | POA: Diagnosis not present

## 2014-10-23 DIAGNOSIS — G2581 Restless legs syndrome: Secondary | ICD-10-CM | POA: Diagnosis not present

## 2014-10-26 DIAGNOSIS — R262 Difficulty in walking, not elsewhere classified: Secondary | ICD-10-CM | POA: Diagnosis not present

## 2014-10-26 DIAGNOSIS — M1712 Unilateral primary osteoarthritis, left knee: Secondary | ICD-10-CM | POA: Diagnosis not present

## 2014-10-26 DIAGNOSIS — M25562 Pain in left knee: Secondary | ICD-10-CM | POA: Diagnosis not present

## 2014-10-27 DIAGNOSIS — Z96652 Presence of left artificial knee joint: Secondary | ICD-10-CM | POA: Diagnosis not present

## 2014-10-27 DIAGNOSIS — Z471 Aftercare following joint replacement surgery: Secondary | ICD-10-CM | POA: Diagnosis not present

## 2014-10-28 DIAGNOSIS — M25562 Pain in left knee: Secondary | ICD-10-CM | POA: Diagnosis not present

## 2014-10-28 DIAGNOSIS — M1712 Unilateral primary osteoarthritis, left knee: Secondary | ICD-10-CM | POA: Diagnosis not present

## 2014-10-28 DIAGNOSIS — R262 Difficulty in walking, not elsewhere classified: Secondary | ICD-10-CM | POA: Diagnosis not present

## 2014-10-30 DIAGNOSIS — M1712 Unilateral primary osteoarthritis, left knee: Secondary | ICD-10-CM | POA: Diagnosis not present

## 2014-10-30 DIAGNOSIS — M25562 Pain in left knee: Secondary | ICD-10-CM | POA: Diagnosis not present

## 2014-10-30 DIAGNOSIS — R262 Difficulty in walking, not elsewhere classified: Secondary | ICD-10-CM | POA: Diagnosis not present

## 2014-11-02 DIAGNOSIS — I4891 Unspecified atrial fibrillation: Secondary | ICD-10-CM | POA: Diagnosis not present

## 2014-11-02 DIAGNOSIS — I10 Essential (primary) hypertension: Secondary | ICD-10-CM | POA: Diagnosis not present

## 2014-11-02 DIAGNOSIS — R7309 Other abnormal glucose: Secondary | ICD-10-CM | POA: Diagnosis not present

## 2014-11-02 DIAGNOSIS — E782 Mixed hyperlipidemia: Secondary | ICD-10-CM | POA: Diagnosis not present

## 2014-11-02 DIAGNOSIS — Z1211 Encounter for screening for malignant neoplasm of colon: Secondary | ICD-10-CM | POA: Diagnosis not present

## 2014-11-03 DIAGNOSIS — R262 Difficulty in walking, not elsewhere classified: Secondary | ICD-10-CM | POA: Diagnosis not present

## 2014-11-03 DIAGNOSIS — M25562 Pain in left knee: Secondary | ICD-10-CM | POA: Diagnosis not present

## 2014-11-03 DIAGNOSIS — M1712 Unilateral primary osteoarthritis, left knee: Secondary | ICD-10-CM | POA: Diagnosis not present

## 2014-11-04 DIAGNOSIS — M25562 Pain in left knee: Secondary | ICD-10-CM | POA: Diagnosis not present

## 2014-11-04 DIAGNOSIS — R262 Difficulty in walking, not elsewhere classified: Secondary | ICD-10-CM | POA: Diagnosis not present

## 2014-11-04 DIAGNOSIS — M1712 Unilateral primary osteoarthritis, left knee: Secondary | ICD-10-CM | POA: Diagnosis not present

## 2014-11-06 DIAGNOSIS — M1712 Unilateral primary osteoarthritis, left knee: Secondary | ICD-10-CM | POA: Diagnosis not present

## 2014-11-06 DIAGNOSIS — M25562 Pain in left knee: Secondary | ICD-10-CM | POA: Diagnosis not present

## 2014-11-06 DIAGNOSIS — R262 Difficulty in walking, not elsewhere classified: Secondary | ICD-10-CM | POA: Diagnosis not present

## 2014-11-09 DIAGNOSIS — M1712 Unilateral primary osteoarthritis, left knee: Secondary | ICD-10-CM | POA: Diagnosis not present

## 2014-11-09 DIAGNOSIS — R262 Difficulty in walking, not elsewhere classified: Secondary | ICD-10-CM | POA: Diagnosis not present

## 2014-11-09 DIAGNOSIS — M25562 Pain in left knee: Secondary | ICD-10-CM | POA: Diagnosis not present

## 2014-11-11 DIAGNOSIS — M25562 Pain in left knee: Secondary | ICD-10-CM | POA: Diagnosis not present

## 2014-11-11 DIAGNOSIS — M1712 Unilateral primary osteoarthritis, left knee: Secondary | ICD-10-CM | POA: Diagnosis not present

## 2014-11-11 DIAGNOSIS — R262 Difficulty in walking, not elsewhere classified: Secondary | ICD-10-CM | POA: Diagnosis not present

## 2014-11-13 DIAGNOSIS — M25562 Pain in left knee: Secondary | ICD-10-CM | POA: Diagnosis not present

## 2014-11-13 DIAGNOSIS — R262 Difficulty in walking, not elsewhere classified: Secondary | ICD-10-CM | POA: Diagnosis not present

## 2014-11-13 DIAGNOSIS — M1712 Unilateral primary osteoarthritis, left knee: Secondary | ICD-10-CM | POA: Diagnosis not present

## 2014-11-16 DIAGNOSIS — M1712 Unilateral primary osteoarthritis, left knee: Secondary | ICD-10-CM | POA: Diagnosis not present

## 2014-11-16 DIAGNOSIS — M25562 Pain in left knee: Secondary | ICD-10-CM | POA: Diagnosis not present

## 2014-11-16 DIAGNOSIS — R262 Difficulty in walking, not elsewhere classified: Secondary | ICD-10-CM | POA: Diagnosis not present

## 2014-11-17 DIAGNOSIS — Z471 Aftercare following joint replacement surgery: Secondary | ICD-10-CM | POA: Diagnosis not present

## 2014-11-17 DIAGNOSIS — Z96652 Presence of left artificial knee joint: Secondary | ICD-10-CM | POA: Diagnosis not present

## 2014-11-18 ENCOUNTER — Other Ambulatory Visit: Payer: Self-pay | Admitting: Internal Medicine

## 2014-11-18 DIAGNOSIS — R262 Difficulty in walking, not elsewhere classified: Secondary | ICD-10-CM | POA: Diagnosis not present

## 2014-11-18 DIAGNOSIS — M1712 Unilateral primary osteoarthritis, left knee: Secondary | ICD-10-CM | POA: Diagnosis not present

## 2014-11-18 DIAGNOSIS — M25562 Pain in left knee: Secondary | ICD-10-CM | POA: Diagnosis not present

## 2014-11-20 DIAGNOSIS — M1712 Unilateral primary osteoarthritis, left knee: Secondary | ICD-10-CM | POA: Diagnosis not present

## 2014-11-20 DIAGNOSIS — R262 Difficulty in walking, not elsewhere classified: Secondary | ICD-10-CM | POA: Diagnosis not present

## 2014-11-20 DIAGNOSIS — M25562 Pain in left knee: Secondary | ICD-10-CM | POA: Diagnosis not present

## 2014-11-23 DIAGNOSIS — M1712 Unilateral primary osteoarthritis, left knee: Secondary | ICD-10-CM | POA: Diagnosis not present

## 2014-11-23 DIAGNOSIS — R262 Difficulty in walking, not elsewhere classified: Secondary | ICD-10-CM | POA: Diagnosis not present

## 2014-11-23 DIAGNOSIS — M25562 Pain in left knee: Secondary | ICD-10-CM | POA: Diagnosis not present

## 2014-11-24 DIAGNOSIS — J069 Acute upper respiratory infection, unspecified: Secondary | ICD-10-CM | POA: Diagnosis not present

## 2014-11-27 ENCOUNTER — Encounter: Payer: Self-pay | Admitting: Internal Medicine

## 2014-11-27 DIAGNOSIS — M1712 Unilateral primary osteoarthritis, left knee: Secondary | ICD-10-CM | POA: Diagnosis not present

## 2014-11-27 DIAGNOSIS — R262 Difficulty in walking, not elsewhere classified: Secondary | ICD-10-CM | POA: Diagnosis not present

## 2014-11-27 DIAGNOSIS — M25562 Pain in left knee: Secondary | ICD-10-CM | POA: Diagnosis not present

## 2014-11-30 DIAGNOSIS — M25562 Pain in left knee: Secondary | ICD-10-CM | POA: Diagnosis not present

## 2014-11-30 DIAGNOSIS — R262 Difficulty in walking, not elsewhere classified: Secondary | ICD-10-CM | POA: Diagnosis not present

## 2014-11-30 DIAGNOSIS — M1712 Unilateral primary osteoarthritis, left knee: Secondary | ICD-10-CM | POA: Diagnosis not present

## 2014-12-03 ENCOUNTER — Other Ambulatory Visit: Payer: Self-pay | Admitting: Family Medicine

## 2014-12-03 DIAGNOSIS — R1900 Intra-abdominal and pelvic swelling, mass and lump, unspecified site: Secondary | ICD-10-CM | POA: Diagnosis not present

## 2014-12-03 DIAGNOSIS — I259 Chronic ischemic heart disease, unspecified: Secondary | ICD-10-CM | POA: Diagnosis not present

## 2014-12-03 DIAGNOSIS — D649 Anemia, unspecified: Secondary | ICD-10-CM | POA: Diagnosis not present

## 2014-12-03 DIAGNOSIS — R634 Abnormal weight loss: Secondary | ICD-10-CM | POA: Diagnosis not present

## 2014-12-03 DIAGNOSIS — Z79899 Other long term (current) drug therapy: Secondary | ICD-10-CM | POA: Diagnosis not present

## 2014-12-03 DIAGNOSIS — E785 Hyperlipidemia, unspecified: Secondary | ICD-10-CM | POA: Diagnosis not present

## 2014-12-03 DIAGNOSIS — E559 Vitamin D deficiency, unspecified: Secondary | ICD-10-CM | POA: Diagnosis not present

## 2014-12-04 DIAGNOSIS — M25562 Pain in left knee: Secondary | ICD-10-CM | POA: Diagnosis not present

## 2014-12-04 DIAGNOSIS — R262 Difficulty in walking, not elsewhere classified: Secondary | ICD-10-CM | POA: Diagnosis not present

## 2014-12-04 DIAGNOSIS — M1712 Unilateral primary osteoarthritis, left knee: Secondary | ICD-10-CM | POA: Diagnosis not present

## 2014-12-05 DIAGNOSIS — J209 Acute bronchitis, unspecified: Secondary | ICD-10-CM | POA: Diagnosis not present

## 2014-12-08 ENCOUNTER — Ambulatory Visit
Admission: RE | Admit: 2014-12-08 | Discharge: 2014-12-08 | Disposition: A | Discharge status: Home / Self Care | Payer: Medicare Other | Source: Ambulatory Visit | Attending: Family Medicine | Admitting: Family Medicine

## 2014-12-08 DIAGNOSIS — K6389 Other specified diseases of intestine: Secondary | ICD-10-CM | POA: Diagnosis not present

## 2014-12-08 DIAGNOSIS — R1900 Intra-abdominal and pelvic swelling, mass and lump, unspecified site: Secondary | ICD-10-CM

## 2014-12-08 DIAGNOSIS — R1031 Right lower quadrant pain: Secondary | ICD-10-CM | POA: Diagnosis not present

## 2014-12-08 MED ORDER — IOPAMIDOL (ISOVUE-300) INJECTION 61%
100.0000 mL | Freq: Once | INTRAVENOUS | Status: AC | PRN
  Administered 2014-12-08: 100 mL via INTRAVENOUS

## 2014-12-09 DIAGNOSIS — E119 Type 2 diabetes mellitus without complications: Secondary | ICD-10-CM | POA: Diagnosis not present

## 2014-12-09 DIAGNOSIS — I6789 Other cerebrovascular disease: Secondary | ICD-10-CM | POA: Diagnosis not present

## 2014-12-09 DIAGNOSIS — R251 Tremor, unspecified: Secondary | ICD-10-CM | POA: Diagnosis not present

## 2014-12-09 DIAGNOSIS — R258 Other abnormal involuntary movements: Secondary | ICD-10-CM | POA: Diagnosis not present

## 2014-12-09 DIAGNOSIS — I10 Essential (primary) hypertension: Secondary | ICD-10-CM | POA: Diagnosis not present

## 2014-12-09 DIAGNOSIS — M1712 Unilateral primary osteoarthritis, left knee: Secondary | ICD-10-CM | POA: Diagnosis not present

## 2014-12-09 DIAGNOSIS — R079 Chest pain, unspecified: Secondary | ICD-10-CM | POA: Diagnosis not present

## 2014-12-09 DIAGNOSIS — I4891 Unspecified atrial fibrillation: Secondary | ICD-10-CM | POA: Diagnosis not present

## 2014-12-09 DIAGNOSIS — R262 Difficulty in walking, not elsewhere classified: Secondary | ICD-10-CM | POA: Diagnosis not present

## 2014-12-09 DIAGNOSIS — M25562 Pain in left knee: Secondary | ICD-10-CM | POA: Diagnosis not present

## 2014-12-09 DIAGNOSIS — R531 Weakness: Secondary | ICD-10-CM | POA: Diagnosis not present

## 2014-12-10 DIAGNOSIS — R1903 Right lower quadrant abdominal swelling, mass and lump: Secondary | ICD-10-CM | POA: Diagnosis not present

## 2014-12-12 DIAGNOSIS — Z79899 Other long term (current) drug therapy: Secondary | ICD-10-CM | POA: Diagnosis not present

## 2014-12-14 DIAGNOSIS — H2513 Age-related nuclear cataract, bilateral: Secondary | ICD-10-CM | POA: Diagnosis not present

## 2014-12-15 DIAGNOSIS — E213 Hyperparathyroidism, unspecified: Secondary | ICD-10-CM | POA: Diagnosis not present

## 2014-12-15 DIAGNOSIS — I4891 Unspecified atrial fibrillation: Secondary | ICD-10-CM | POA: Diagnosis not present

## 2014-12-15 DIAGNOSIS — R935 Abnormal findings on diagnostic imaging of other abdominal regions, including retroperitoneum: Secondary | ICD-10-CM | POA: Diagnosis not present

## 2014-12-15 DIAGNOSIS — E119 Type 2 diabetes mellitus without complications: Secondary | ICD-10-CM | POA: Diagnosis not present

## 2014-12-15 DIAGNOSIS — R634 Abnormal weight loss: Secondary | ICD-10-CM | POA: Diagnosis not present

## 2014-12-15 DIAGNOSIS — D649 Anemia, unspecified: Secondary | ICD-10-CM | POA: Diagnosis not present

## 2014-12-16 DIAGNOSIS — R262 Difficulty in walking, not elsewhere classified: Secondary | ICD-10-CM | POA: Diagnosis not present

## 2014-12-16 DIAGNOSIS — M1712 Unilateral primary osteoarthritis, left knee: Secondary | ICD-10-CM | POA: Diagnosis not present

## 2014-12-16 DIAGNOSIS — M25562 Pain in left knee: Secondary | ICD-10-CM | POA: Diagnosis not present

## 2014-12-20 DIAGNOSIS — J45909 Unspecified asthma, uncomplicated: Secondary | ICD-10-CM | POA: Diagnosis not present

## 2014-12-20 DIAGNOSIS — M1990 Unspecified osteoarthritis, unspecified site: Secondary | ICD-10-CM | POA: Diagnosis not present

## 2014-12-20 DIAGNOSIS — D649 Anemia, unspecified: Secondary | ICD-10-CM | POA: Diagnosis not present

## 2014-12-20 DIAGNOSIS — M810 Age-related osteoporosis without current pathological fracture: Secondary | ICD-10-CM | POA: Diagnosis not present

## 2014-12-20 DIAGNOSIS — Z9049 Acquired absence of other specified parts of digestive tract: Secondary | ICD-10-CM | POA: Diagnosis not present

## 2014-12-20 DIAGNOSIS — R634 Abnormal weight loss: Secondary | ICD-10-CM | POA: Diagnosis not present

## 2014-12-20 DIAGNOSIS — I252 Old myocardial infarction: Secondary | ICD-10-CM | POA: Diagnosis not present

## 2014-12-20 DIAGNOSIS — I10 Essential (primary) hypertension: Secondary | ICD-10-CM | POA: Diagnosis not present

## 2014-12-20 DIAGNOSIS — D120 Benign neoplasm of cecum: Secondary | ICD-10-CM | POA: Diagnosis not present

## 2014-12-20 DIAGNOSIS — I4891 Unspecified atrial fibrillation: Secondary | ICD-10-CM | POA: Diagnosis not present

## 2014-12-20 DIAGNOSIS — E782 Mixed hyperlipidemia: Secondary | ICD-10-CM | POA: Diagnosis not present

## 2014-12-20 DIAGNOSIS — E119 Type 2 diabetes mellitus without complications: Secondary | ICD-10-CM | POA: Diagnosis not present

## 2014-12-20 DIAGNOSIS — K5730 Diverticulosis of large intestine without perforation or abscess without bleeding: Secondary | ICD-10-CM | POA: Diagnosis not present

## 2014-12-20 DIAGNOSIS — K648 Other hemorrhoids: Secondary | ICD-10-CM | POA: Diagnosis not present

## 2014-12-20 DIAGNOSIS — F419 Anxiety disorder, unspecified: Secondary | ICD-10-CM | POA: Diagnosis not present

## 2014-12-21 DIAGNOSIS — I10 Essential (primary) hypertension: Secondary | ICD-10-CM | POA: Diagnosis not present

## 2014-12-21 DIAGNOSIS — R634 Abnormal weight loss: Secondary | ICD-10-CM | POA: Diagnosis not present

## 2014-12-21 DIAGNOSIS — R935 Abnormal findings on diagnostic imaging of other abdominal regions, including retroperitoneum: Secondary | ICD-10-CM | POA: Diagnosis not present

## 2014-12-21 DIAGNOSIS — I4891 Unspecified atrial fibrillation: Secondary | ICD-10-CM | POA: Diagnosis not present

## 2014-12-21 DIAGNOSIS — D649 Anemia, unspecified: Secondary | ICD-10-CM | POA: Diagnosis not present

## 2014-12-21 DIAGNOSIS — E119 Type 2 diabetes mellitus without complications: Secondary | ICD-10-CM | POA: Diagnosis not present

## 2014-12-21 DIAGNOSIS — K5730 Diverticulosis of large intestine without perforation or abscess without bleeding: Secondary | ICD-10-CM | POA: Diagnosis not present

## 2014-12-21 DIAGNOSIS — K635 Polyp of colon: Secondary | ICD-10-CM | POA: Diagnosis not present

## 2014-12-21 DIAGNOSIS — D120 Benign neoplasm of cecum: Secondary | ICD-10-CM | POA: Diagnosis not present

## 2014-12-21 DIAGNOSIS — K648 Other hemorrhoids: Secondary | ICD-10-CM | POA: Diagnosis not present

## 2014-12-22 DIAGNOSIS — E213 Hyperparathyroidism, unspecified: Secondary | ICD-10-CM | POA: Diagnosis not present

## 2014-12-22 DIAGNOSIS — R1901 Right upper quadrant abdominal swelling, mass and lump: Secondary | ICD-10-CM | POA: Diagnosis not present

## 2014-12-24 DIAGNOSIS — E213 Hyperparathyroidism, unspecified: Secondary | ICD-10-CM | POA: Diagnosis not present

## 2014-12-29 DIAGNOSIS — Z471 Aftercare following joint replacement surgery: Secondary | ICD-10-CM | POA: Diagnosis not present

## 2014-12-29 DIAGNOSIS — Z96652 Presence of left artificial knee joint: Secondary | ICD-10-CM | POA: Diagnosis not present

## 2015-01-01 ENCOUNTER — Other Ambulatory Visit: Payer: Self-pay | Admitting: Internal Medicine

## 2015-01-01 ENCOUNTER — Other Ambulatory Visit: Payer: Self-pay

## 2015-01-01 MED ORDER — AMLODIPINE BESYLATE 2.5 MG PO TABS: 2.5000 mg | ORAL_TABLET | Freq: Every day | ORAL | Status: DC

## 2015-01-05 DIAGNOSIS — L03116 Cellulitis of left lower limb: Secondary | ICD-10-CM | POA: Diagnosis not present

## 2015-01-05 DIAGNOSIS — M79672 Pain in left foot: Secondary | ICD-10-CM | POA: Diagnosis not present

## 2015-01-11 ENCOUNTER — Ambulatory Visit: Payer: Medicare Other | Admitting: Internal Medicine

## 2015-01-15 DIAGNOSIS — E213 Hyperparathyroidism, unspecified: Secondary | ICD-10-CM | POA: Diagnosis not present

## 2015-01-15 DIAGNOSIS — M810 Age-related osteoporosis without current pathological fracture: Secondary | ICD-10-CM | POA: Diagnosis not present

## 2015-01-15 DIAGNOSIS — E212 Other hyperparathyroidism: Secondary | ICD-10-CM | POA: Diagnosis not present

## 2015-01-15 DIAGNOSIS — N200 Calculus of kidney: Secondary | ICD-10-CM | POA: Diagnosis not present

## 2015-01-15 DIAGNOSIS — Z780 Asymptomatic menopausal state: Secondary | ICD-10-CM | POA: Diagnosis not present

## 2015-01-15 DIAGNOSIS — E559 Vitamin D deficiency, unspecified: Secondary | ICD-10-CM | POA: Diagnosis not present

## 2015-02-02 DIAGNOSIS — H2512 Age-related nuclear cataract, left eye: Secondary | ICD-10-CM | POA: Diagnosis not present

## 2015-02-02 DIAGNOSIS — H02839 Dermatochalasis of unspecified eye, unspecified eyelid: Secondary | ICD-10-CM | POA: Diagnosis not present

## 2015-02-02 DIAGNOSIS — H18412 Arcus senilis, left eye: Secondary | ICD-10-CM | POA: Diagnosis not present

## 2015-02-02 DIAGNOSIS — H2511 Age-related nuclear cataract, right eye: Secondary | ICD-10-CM | POA: Diagnosis not present

## 2015-02-02 DIAGNOSIS — H18411 Arcus senilis, right eye: Secondary | ICD-10-CM | POA: Diagnosis not present

## 2015-02-03 DIAGNOSIS — E213 Hyperparathyroidism, unspecified: Secondary | ICD-10-CM | POA: Diagnosis not present

## 2015-02-03 DIAGNOSIS — M810 Age-related osteoporosis without current pathological fracture: Secondary | ICD-10-CM | POA: Diagnosis not present

## 2015-02-19 ENCOUNTER — Encounter: Payer: Self-pay | Admitting: Internal Medicine

## 2015-02-19 ENCOUNTER — Ambulatory Visit (INDEPENDENT_AMBULATORY_CARE_PROVIDER_SITE_OTHER): Payer: Medicare Other | Admitting: Internal Medicine

## 2015-02-19 VITALS — BP 156/70 | HR 57 | Ht 62.0 in | Wt 144.0 lb

## 2015-02-19 DIAGNOSIS — I480 Paroxysmal atrial fibrillation: Secondary | ICD-10-CM | POA: Diagnosis not present

## 2015-02-19 NOTE — Progress Notes (Signed)
Okay could remember      Patient Care Team: Tamsen Roers, MD as PCP - General (Family Medicine)   HPI  Kelly Boone is a 81 y.o. female Seen with recent diagnosis of atrial fibrillation for which she was started on Rivaroxaban , losaran and Mag and aldactone.  She apparently underwent Myoview scanning and echocardiography which were unrevealing  reports were reviewed. Interestingly her left atrial dimension was normal.  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  Her atrial fibrillation is paroxysmal; she has baseline sinus bradycardia so we have been using beta blockers when necessary.  She has occasional nocturnal palpitations; they're not long lasting.  She has hx of labile hypertension. She is also struggling with some orthostatic intolerance. This is particularly when getting up from a chair.  She is currently on apixaban         Past Medical History  Diagnosis Date  . A-fib (HCC)     irregular heartbeat  . DM (diabetes mellitus) ([place]) 2004  . Hypertension 1997  . Hyperlipemia 1997  . Allergic rhinitis   . Anxiety   . Asthma   . Atopic dermatitis   . Benign essential hypertension   . Edema   . Special screening for malignant neoplasms, colon   . Esophagitis   . Headache   . Hearing loss   . Encounter for long-term (current) use of other medications   . Hypercalcemia   . Sinus bradycardia   . Disorders of magnesium metabolism   . Hyposmolality and/or hyponatremia   . Pain in joint, lower leg   . Myalgia and myositis, unspecified   . Need for prophylactic vaccination and inoculation against influenza   . Other syndromes affecting cervical region   . Osteoporosis, unspecified   . Pain in joint, shoulder region   . Restless legs syndrome (RLS)   . Screening for depression   . Varicose veins of lower extremities with inflammation   . Unspecified essential  hypertension   . Peripheral autonomic neuropathy in disorders classified elsewhere(337.1)   . Type 2 diabetes mellitus with autonomic neuropathy ([place])   . Urinary tract infection, site not specified   . Other screening mammogram   . Unspecified vitamin D deficiency   . Myocardial infarction [facility])     June 2015, was told caused by stress  . Pneumonia     as infant  . Depression   . Chronic kidney disease     hx of kidney stones as teenager  . GERD (gastroesophageal reflux disease)   . Arthritis     Past Surgical History  Procedure Laterality Date  . Appendectomy    . Cholecystectomy    . Abdominal hysterectomy    . Shoulder surgery      Repair  . Tonsillectomy    . Other surgical history      Nuclear Stress Test with EF 70%, Chest Radiograph-lungs are clear without pleural effusions or focal consolidations. Trachea projects midline and there is no pneumothorax. Suture anchors in right humeral head.  . Left heart catheterization with coronary angiogram N/A 09/30/2013    Procedure: LEFT HEART CATHETERIZATION WITH CORONARY ANGIOGRAM;  Surgeon: Troy Sine, MD;  Location: [facility] CATH LAB;  Service: Cardiovascular;  Laterality: N/A;  . Benign breast biopsy    . Total knee arthroplasty Left 10/14/2014    Procedure: LEFT TOTAL KNEE ARTHROPLASTY;  Surgeon: Latanya Maudlin, MD;  Location:  [facility] ORS;  Service: Orthopedics;  Laterality: Left;    Current Outpatient Prescriptions  Medication Sig Dispense Refill  . albuterol (PROVENTIL HFA;VENTOLIN HFA) 108 (90 BASE) MCG/ACT inhaler Inhale 2 puffs into the lungs every 6 (six) hours as needed for wheezing or shortness of breath.    Marland Kitchen amLODipine (NORVASC) 2.5 MG tablet Take 1 tablet (2.5 mg total) by mouth daily. 30 tablet 5  . apixaban (ELIQUIS) 2.5 MG TABS tablet Take 1 tablet (2.5 mg total) by mouth 2 (two) times daily. (Patient taking differently: Take 2.5 mg by mouth 2 (two) times daily. @ 0700 & 1930) 60 tablet 6  . BIOTIN PO Take 1,000 mg by  mouth daily.     . Cholecalciferol (VITAMIN D PO) Take 1,000 Units by mouth daily.    . cyanocobalamin 500 MCG tablet Take 500 mcg by mouth daily.    . diazepam (VALIUM) 5 MG tablet Take 5 mg by mouth every 6 (six) hours as needed for anxiety.    . ferrous sulfate 325 (65 FE) MG tablet Take 1 tablet (325 mg total) by mouth 2 (two) times daily with a meal. 20 tablet 0  . furosemide (LASIX) 20 MG tablet Take 1 tablet (20 mg total) by mouth daily as needed for edema. 20 tablet 2  . losartan-hydrochlorothiazide (HYZAAR) 50-12.5 MG per tablet Take 2 tablets by mouth daily. (Patient taking differently: Take 1 tablet by mouth daily. ) 60 tablet 6  . Magnesium 250 MG TABS Take 2 tablets by mouth every morning.     . methocarbamol (ROBAXIN) 500 MG tablet Take 1 tablet (500 mg total) by mouth every 6 (six) hours as needed for muscle spasms. 40 tablet 1  . metoprolol (LOPRESSOR) 25 MG tablet Take 0.5 tablets (12.5 mg total) by mouth 2 (two) times daily. 30 tablet 0  . OVER THE COUNTER MEDICATION Take 5 mLs by mouth daily. restless leg syndrome. OPC-3    . oxyCODONE-acetaminophen (PERCOCET/ROXICET) 5-325 MG per tablet Take 0.5-1 tablets by mouth every 4 (four) hours as needed for severe pain. 60 tablet 0  . propranolol (INDERAL) 10 MG tablet Take 10 mg by mouth every 6 (six) hours as needed (increased heart rate and fibulation).    Marland Kitchen rOPINIRole (REQUIP) 2 MG tablet Take 2 mg by mouth 2 (two) times daily. Before supper and bed    . [DISCONTINUED] carvedilol (COREG) 3.125 MG tablet Take 3.125 mg by mouth 2 (two) times daily with a meal.     No current facility-administered medications for this visit.    Allergies  Allergen Reactions  . Daypro [Oxaprozin] Other (See Comments)    Palpitation  . Statins Other (See Comments)    Myalgias  Patient denies this allergy at preop visit 10-08-14  . Zetia [Ezetimibe] Other (See Comments)    Joint Pain, Swelling    Review of Systems negative except from HPI and  PMH  Physical Exam BP 156/70 mmHg  Pulse 57  Ht 5\' 2"  (1.575 m)  Wt 144 lb (65.318 kg)  BMI 26.33 kg/m2 Well developed and nourished in no acute distress HENT normal Neck supple with JVP-flat Clear Regular rate and rhythm, no murmurs or gallops Abd-soft with active BS No Clubbing cyanosis edema Skin-warm and dry A & Oriented  Grossly normal sensory and motor function   ECG demonstrated sinus rhythm at 59   Records were reviewed from the emergency room ECG demonstrated atrial fibrillation  Assessment and  Plan  Atrial fibrillation  Sinus bradycardia  Hypertension- white coat  Diabetes    Atrial fibrillation appears quiescent. She is on apixaban 2.5    Hypertension is well controlled at home.  We'll do orthostatic symptoms, we have reviewed the role of isometric contraction prior to standing. In the event that her symptoms worsen, she is encouraged to get an abdominal binder.  Blood pressures at home are well-controlled.

## 2015-02-19 NOTE — Patient Instructions (Signed)
Medication Instructions: - no changes  Labwork: - none  Procedures/Testing: - none  Follow-Up: - Your physician wants you to follow-up in: 1 year with Dr. Klein. You will receive a reminder letter in the mail two months in advance. If you don't receive a letter, please call our office to schedule the follow-up appointment.  Any Additional Special Instructions Will Be Listed Below (If Applicable). - none  

## 2015-03-01 ENCOUNTER — Other Ambulatory Visit: Payer: Self-pay | Admitting: Internal Medicine

## 2015-03-04 DIAGNOSIS — E213 Hyperparathyroidism, unspecified: Secondary | ICD-10-CM | POA: Diagnosis not present

## 2015-03-16 DIAGNOSIS — Z23 Encounter for immunization: Secondary | ICD-10-CM | POA: Diagnosis not present

## 2015-03-18 ENCOUNTER — Other Ambulatory Visit: Payer: Self-pay | Admitting: Internal Medicine

## 2015-03-22 DIAGNOSIS — H25812 Combined forms of age-related cataract, left eye: Secondary | ICD-10-CM | POA: Diagnosis not present

## 2015-03-22 DIAGNOSIS — H2512 Age-related nuclear cataract, left eye: Secondary | ICD-10-CM | POA: Diagnosis not present

## 2015-03-23 DIAGNOSIS — H2511 Age-related nuclear cataract, right eye: Secondary | ICD-10-CM | POA: Diagnosis not present

## 2015-04-23 DIAGNOSIS — H25811 Combined forms of age-related cataract, right eye: Secondary | ICD-10-CM | POA: Diagnosis not present

## 2015-04-23 DIAGNOSIS — H2511 Age-related nuclear cataract, right eye: Secondary | ICD-10-CM | POA: Diagnosis not present

## 2015-05-05 DIAGNOSIS — E041 Nontoxic single thyroid nodule: Secondary | ICD-10-CM | POA: Insufficient documentation

## 2015-05-05 DIAGNOSIS — E210 Primary hyperparathyroidism: Secondary | ICD-10-CM | POA: Insufficient documentation

## 2015-05-07 ENCOUNTER — Telehealth: Payer: Self-pay | Admitting: Internal Medicine

## 2015-05-07 NOTE — Telephone Encounter (Signed)
1.  What type of surgery is being performed?  Parathyroidectomy  2.  When is this surgery scheduled?  Pending cardiac clearance  3.  Are there any medications that need to be held prior to surgery and how long?  Eliquis  4.  Name of physician performing surgery:  Dr. Glenice Laine  5.  What is the office phone and fax number?  Phone [phone] and Fax [phone]   Will route to Dr. Caryl Comes and his nurse for review, advisement and follow up

## 2015-05-07 NOTE — Telephone Encounter (Signed)
Kelly Boone is calling from Dr. Glenice Laine office to get cardiac clearance for an upcoming surgery ( Parathyroidectomy) as well as when she can come off of her Eliquis .   Thanks

## 2015-05-10 ENCOUNTER — Other Ambulatory Visit: Payer: Self-pay | Admitting: Internal Medicine

## 2015-05-10 NOTE — Telephone Encounter (Signed)
Pt can stop apixoban as needed for surgery, typically 48 hrs  Also ok to proceed with surgery if no new symptoms of chest pain shortness of breath or swelling  If necessary we are glad to see pt

## 2015-05-11 ENCOUNTER — Telehealth: Payer: Self-pay | Admitting: Internal Medicine

## 2015-05-11 NOTE — Telephone Encounter (Signed)
Request for surgical clearance:  1. What type of surgery is being performed? Thyroid  When is this surgery scheduled? 02/27/017  2. Are there any medications that need to be held prior to surgery and how long? NO   3. Name of physician performing surgery?Dr.Wagoner  4. What is your office phone and fax number? [phone]

## 2015-05-13 NOTE — Telephone Encounter (Signed)
See 2/3 phone note.

## 2015-05-13 NOTE — Telephone Encounter (Signed)
Copy of this telephone encounter with Dr. Olin Pia recommendations faxed to Santiago Glad at [phone]. Confirmation received.

## 2015-05-13 NOTE — Telephone Encounter (Signed)
Follow up      Calling to follow up on the surgical clearance request from Tuesday.  Please call

## 2015-05-16 IMAGING — CR DG CHEST 1V PORT
1 series · 1 of 1 positions shown · non-contrast
Comparison: Two-view chest x-ray 09/28/2013 and 08/10/2005.

CLINICAL DATA: One day history of atrial fibrillation. Current
history of diabetes, hypertension and asthma.

EXAM:
PORTABLE CHEST - 1 VIEW

[AP]
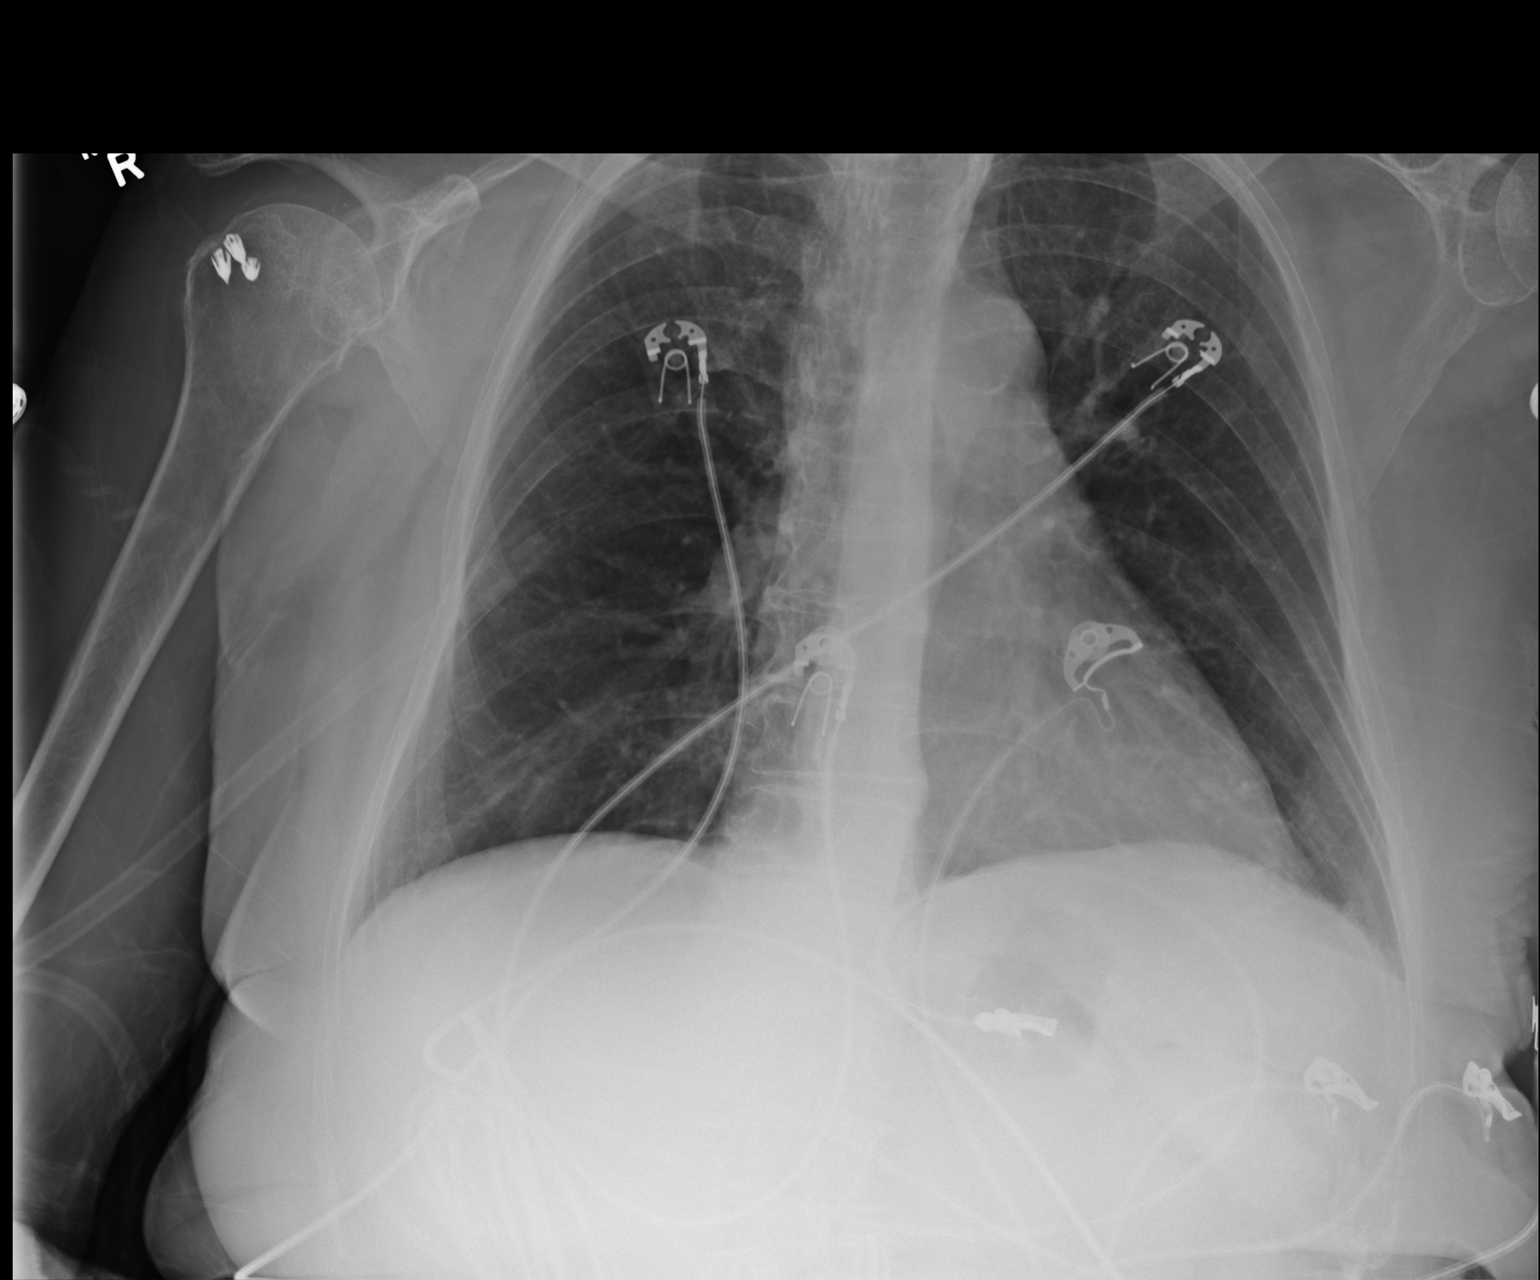

[1 of 1 positions shown; findings below may reference images not displayed]

FINDINGS: Cardiac silhouette normal in size, unchanged. Lungs clear.
Bronchovascular markings normal. Pulmonary vascularity normal. No
visible pleural effusions. No pneumothorax.
IMPRESSION: No acute cardiopulmonary disease.

## 2015-05-24 DIAGNOSIS — Z79899 Other long term (current) drug therapy: Secondary | ICD-10-CM | POA: Diagnosis not present

## 2015-05-24 DIAGNOSIS — G2581 Restless legs syndrome: Secondary | ICD-10-CM | POA: Diagnosis not present

## 2015-05-24 DIAGNOSIS — I4891 Unspecified atrial fibrillation: Secondary | ICD-10-CM | POA: Diagnosis not present

## 2015-05-24 DIAGNOSIS — I10 Essential (primary) hypertension: Secondary | ICD-10-CM | POA: Diagnosis not present

## 2015-05-24 DIAGNOSIS — M6281 Muscle weakness (generalized): Secondary | ICD-10-CM | POA: Diagnosis not present

## 2015-05-24 DIAGNOSIS — K219 Gastro-esophageal reflux disease without esophagitis: Secondary | ICD-10-CM | POA: Diagnosis not present

## 2015-05-24 DIAGNOSIS — Z01810 Encounter for preprocedural cardiovascular examination: Secondary | ICD-10-CM | POA: Diagnosis not present

## 2015-05-24 DIAGNOSIS — E041 Nontoxic single thyroid nodule: Secondary | ICD-10-CM | POA: Diagnosis not present

## 2015-05-24 DIAGNOSIS — E210 Primary hyperparathyroidism: Secondary | ICD-10-CM | POA: Diagnosis not present

## 2015-05-24 DIAGNOSIS — M810 Age-related osteoporosis without current pathological fracture: Secondary | ICD-10-CM | POA: Diagnosis not present

## 2015-05-31 DIAGNOSIS — E041 Nontoxic single thyroid nodule: Secondary | ICD-10-CM | POA: Diagnosis not present

## 2015-05-31 DIAGNOSIS — G2581 Restless legs syndrome: Secondary | ICD-10-CM | POA: Diagnosis not present

## 2015-05-31 DIAGNOSIS — I4891 Unspecified atrial fibrillation: Secondary | ICD-10-CM | POA: Diagnosis not present

## 2015-05-31 DIAGNOSIS — K219 Gastro-esophageal reflux disease without esophagitis: Secondary | ICD-10-CM | POA: Diagnosis not present

## 2015-05-31 DIAGNOSIS — E210 Primary hyperparathyroidism: Secondary | ICD-10-CM | POA: Diagnosis not present

## 2015-05-31 DIAGNOSIS — I10 Essential (primary) hypertension: Secondary | ICD-10-CM | POA: Diagnosis not present

## 2015-06-08 ENCOUNTER — Telehealth: Payer: Self-pay | Admitting: Internal Medicine

## 2015-06-08 NOTE — Telephone Encounter (Signed)
Called patient back. Patient feels that she is in A. Fib. Patient stated it happen once last week and again last night. Patient also complained of SOB at night when she is in A. Fib. Patient stated she is still in A. Fib this morning. Recent BP 142/103 and HR 111. Patient stated she usually comes out of it and back to normal, this time it seems like it is more frequent. Will consult DOD/FLEX.  Dr. Irish Lack (DOD) recommended patient to take her propranolol 10 mg that she has PRN for elevated HR and A. Fib. Informed patient of these instructions. Patient did not even know she had this medication. Patient stated she has never touched it, because she did not know what it was for. Explained to patient that her propranolol is for her elevated HR and A. Fib. Informed patient to take as needed every 6 hours. Encouraged patient to check her BP and HR to make sure it does not get too low. Informed patient if the propranolol does not work, to call back and let our office know. Patient verbalized understanding.

## 2015-06-08 NOTE — Telephone Encounter (Signed)
Patient c/o Palpitations:  High priority if patient c/o lightheadedness and shortness of breath.  Pt c/o of going into AF more frequently.   1. How long have you been having palpitations? Last week and last night, beginning to be more frequent   2. Are you currently experiencing lightheadedness and shortness of breath?little SOB last night 06/07/15-   3. Have you checked your BP and heart rate? (document readings) tried, but stated that BP got up to 178/113 pulse was around 130.   4. Are you experiencing any other symptoms? no

## 2015-06-16 DIAGNOSIS — Z6828 Body mass index (BMI) 28.0-28.9, adult: Secondary | ICD-10-CM | POA: Insufficient documentation

## 2015-06-16 DIAGNOSIS — E210 Primary hyperparathyroidism: Secondary | ICD-10-CM | POA: Diagnosis not present

## 2015-07-05 DIAGNOSIS — J45998 Other asthma: Secondary | ICD-10-CM | POA: Diagnosis not present

## 2015-07-05 DIAGNOSIS — I4891 Unspecified atrial fibrillation: Secondary | ICD-10-CM | POA: Diagnosis not present

## 2015-07-05 DIAGNOSIS — E782 Mixed hyperlipidemia: Secondary | ICD-10-CM | POA: Diagnosis not present

## 2015-08-03 DIAGNOSIS — E215 Disorder of parathyroid gland, unspecified: Secondary | ICD-10-CM | POA: Diagnosis not present

## 2015-08-03 DIAGNOSIS — E119 Type 2 diabetes mellitus without complications: Secondary | ICD-10-CM | POA: Diagnosis not present

## 2015-08-03 DIAGNOSIS — E213 Hyperparathyroidism, unspecified: Secondary | ICD-10-CM | POA: Diagnosis not present

## 2015-08-03 DIAGNOSIS — I10 Essential (primary) hypertension: Secondary | ICD-10-CM | POA: Diagnosis not present

## 2015-09-07 DIAGNOSIS — R0782 Intercostal pain: Secondary | ICD-10-CM | POA: Diagnosis not present

## 2015-09-07 DIAGNOSIS — R0789 Other chest pain: Secondary | ICD-10-CM | POA: Diagnosis not present

## 2015-09-23 DIAGNOSIS — J0100 Acute maxillary sinusitis, unspecified: Secondary | ICD-10-CM | POA: Diagnosis not present

## 2015-09-27 ENCOUNTER — Other Ambulatory Visit: Payer: Self-pay | Admitting: Internal Medicine

## 2015-11-23 ENCOUNTER — Encounter (INDEPENDENT_AMBULATORY_CARE_PROVIDER_SITE_OTHER): Payer: Self-pay

## 2015-11-23 ENCOUNTER — Other Ambulatory Visit: Payer: Self-pay

## 2015-11-23 ENCOUNTER — Ambulatory Visit (INDEPENDENT_AMBULATORY_CARE_PROVIDER_SITE_OTHER): Payer: Medicare Other | Admitting: Internal Medicine

## 2015-11-23 ENCOUNTER — Encounter: Payer: Self-pay | Admitting: Internal Medicine

## 2015-11-23 VITALS — BP 158/78 | HR 69 | Ht 62.0 in | Wt 157.8 lb

## 2015-11-23 DIAGNOSIS — R001 Bradycardia, unspecified: Secondary | ICD-10-CM | POA: Diagnosis not present

## 2015-11-23 DIAGNOSIS — I480 Paroxysmal atrial fibrillation: Secondary | ICD-10-CM

## 2015-11-23 DIAGNOSIS — I10 Essential (primary) hypertension: Secondary | ICD-10-CM | POA: Diagnosis not present

## 2015-11-23 NOTE — Progress Notes (Signed)
Okay could remember      Patient Care Team: Tamsen Roers, MD as PCP - General (Family Medicine)   HPI  Kelly Boone is a 82 y.o. female Seen with recent diagnosis of atrial fibrillation for which she was started on Rivaroxaban , losaran and Mag and aldactone.  She apparently underwent Myoview scanning and echocardiography which were unrevealing  reports were reviewed. Interestingly her left atrial dimension was normal.  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  Her atrial fibrillation is paroxysmal; she has baseline sinus bradycardia so we have been using beta blockers when necessary.  She has occasional nocturnal palpitations; they're not long lasting.  She has hx of labile hypertension   Her husband has been diagnosed with metastatic cancer. He is here today with his wife. He is wearing oxygen.  She is currently on apixaban         Past Medical History:  Diagnosis Date  . A-fib (HCC)    irregular heartbeat  . Allergic rhinitis   . Anxiety   . Arthritis   . Asthma   . Atopic dermatitis   . Benign essential hypertension   . Chronic kidney disease    hx of kidney stones as teenager  . Depression   . Disorders of magnesium metabolism   . DM (diabetes mellitus) ([place]) 2004  . Edema   . Encounter for long-term (current) use of other medications   . Esophagitis   . GERD (gastroesophageal reflux disease)   . Headache   . Hearing loss   . Hypercalcemia   . Hyperlipemia 1997  . Hypertension 1997  . Hyposmolality and/or hyponatremia   . Myalgia and myositis, unspecified   . Myocardial infarction [facility])    June 2015, was told caused by stress  . Need for prophylactic vaccination and inoculation against influenza   . Osteoporosis, unspecified   . Other screening mammogram   . Other syndromes affecting cervical region   . Pain in joint, lower leg   . Pain in joint,  shoulder region   . Peripheral autonomic neuropathy in disorders classified elsewhere(337.1)   . Pneumonia    as infant  . Restless legs syndrome (RLS)   . Screening for depression   . Sinus bradycardia   . Special screening for malignant neoplasms, colon   . Type 2 diabetes mellitus with autonomic neuropathy ([place])   . Unspecified essential hypertension   . Unspecified vitamin D deficiency   . Urinary tract infection, site not specified   . Varicose veins of lower extremities with inflammation     Past Surgical History:  Procedure Laterality Date  . ABDOMINAL HYSTERECTOMY    . APPENDECTOMY    . benign breast biopsy    . CHOLECYSTECTOMY    . LEFT HEART CATHETERIZATION WITH CORONARY ANGIOGRAM N/A 09/30/2013   Procedure: LEFT HEART CATHETERIZATION WITH CORONARY ANGIOGRAM;  Surgeon: Troy Sine, MD;  Location: [facility] CATH LAB;  Service: Cardiovascular;  Laterality: N/A;  . OTHER SURGICAL HISTORY     Nuclear Stress Test with EF 70%, Chest Radiograph-lungs are clear without pleural effusions or focal consolidations. Trachea projects midline and there is no pneumothorax. Suture anchors in right humeral head.  Marland Kitchen SHOULDER SURGERY     Repair  . TONSILLECTOMY    . TOTAL KNEE ARTHROPLASTY Left 10/14/2014   Procedure: LEFT TOTAL KNEE ARTHROPLASTY;  Surgeon: Latanya Maudlin, MD;  Location: [facility] ORS;  Service:  Orthopedics;  Laterality: Left;    Current Outpatient Prescriptions  Medication Sig Dispense Refill  . albuterol (PROVENTIL HFA;VENTOLIN HFA) 108 (90 BASE) MCG/ACT inhaler Inhale 2 puffs into the lungs every 6 (six) hours as needed for wheezing or shortness of breath.    Marland Kitchen amLODipine (NORVASC) 2.5 MG tablet TAKE ONE TABLET BY MOUTH ONCE DAILY 30 tablet 5  . BIOTIN PO Take 1,000 mg by mouth daily.     . Cholecalciferol (VITAMIN D PO) Take 1,000 Units by mouth daily.    . cyanocobalamin 500 MCG tablet Take 500 mcg by mouth daily.    . diazepam (VALIUM) 5 MG tablet Take 5 mg by mouth every 6  (six) hours as needed for anxiety.    . donepezil (ARICEPT) 5 MG tablet Take 1 tablet by mouth daily.    Marland Kitchen ELIQUIS 2.5 MG TABS tablet TAKE ONE TABLET BY MOUTH TWICE DAILY 60 tablet 10  . furosemide (LASIX) 20 MG tablet TAKE ONE TABLET BY MOUTH ONCE DAILY AS NEEDED FOR  EDEMA 30 tablet 10  . Magnesium 250 MG TABS Take 2 tablets by mouth every morning.     . metoprolol (LOPRESSOR) 25 MG tablet Take 0.5 tablets (12.5 mg total) by mouth 2 (two) times daily. 30 tablet 0  . oxyCODONE-acetaminophen (PERCOCET/ROXICET) 5-325 MG per tablet Take 0.5-1 tablets by mouth every 4 (four) hours as needed for severe pain. 60 tablet 0  . propranolol (INDERAL) 10 MG tablet Take 10 mg by mouth every 6 (six) hours as needed (increased heart rate and fibulation).    . valsartan (DIOVAN) 80 MG tablet Take 1 tablet by mouth daily.    Marland Kitchen losartan-hydrochlorothiazide (HYZAAR) 50-12.5 MG tablet TAKE TWO TABLETS BY MOUTH ONCE DAILY 60 tablet 9  . OVER THE COUNTER MEDICATION Take 1 tsp by mouth daily - restless leg syndrome.  OPC-3     No current facility-administered medications for this visit.     Allergies  Allergen Reactions  . Daypro [Oxaprozin] Other (See Comments)    Palpitation  . Statins Other (See Comments)    Myalgias  Patient denies this allergy at preop visit 10-08-14  . Zetia [Ezetimibe] Other (See Comments)    Joint Pain, Swelling    Review of Systems negative except from HPI and PMH  Physical Exam BP (!) 158/78   Pulse 69   Ht 5\' 2"  (1.575 m)   Wt 157 lb 12.8 oz (71.6 kg)   SpO2 99%   BMI 28.86 kg/m  Well developed and nourished in no acute distress HENT normal Neck supple with JVP-flat Clear Regular rate and rhythm, no murmurs or gallops Abd-soft with active BS No Clubbing cyanosis edema Skin-warm and dry A & Oriented  Grossly normal sensory and motor function   ECG demonstrated sinus rhythm at  R69 Intervals 17/09/37 Axis LVI   Assessment and  Plan  Atrial  fibrillation-paroxysmal   Sinus bradycardia  Hypertension- white coat  Diabetes     Heart rhythm issues are pretty well controlled. Blood pressure is elevated today. She is under an inordinate amount of stress as her husband is dying of cancer. He came with her today.

## 2015-11-23 NOTE — Patient Instructions (Signed)

## 2015-12-15 DIAGNOSIS — Z23 Encounter for immunization: Secondary | ICD-10-CM | POA: Diagnosis not present

## 2015-12-15 DIAGNOSIS — E892 Postprocedural hypoparathyroidism: Secondary | ICD-10-CM | POA: Diagnosis not present

## 2015-12-15 DIAGNOSIS — Z79899 Other long term (current) drug therapy: Secondary | ICD-10-CM | POA: Diagnosis not present

## 2015-12-15 DIAGNOSIS — Z1389 Encounter for screening for other disorder: Secondary | ICD-10-CM | POA: Diagnosis not present

## 2015-12-15 DIAGNOSIS — F418 Other specified anxiety disorders: Secondary | ICD-10-CM | POA: Diagnosis not present

## 2015-12-15 DIAGNOSIS — E785 Hyperlipidemia, unspecified: Secondary | ICD-10-CM | POA: Diagnosis not present

## 2015-12-15 DIAGNOSIS — E1169 Type 2 diabetes mellitus with other specified complication: Secondary | ICD-10-CM | POA: Diagnosis not present

## 2015-12-15 DIAGNOSIS — G2581 Restless legs syndrome: Secondary | ICD-10-CM | POA: Diagnosis not present

## 2015-12-15 DIAGNOSIS — I10 Essential (primary) hypertension: Secondary | ICD-10-CM | POA: Diagnosis not present

## 2015-12-15 DIAGNOSIS — Z0000 Encounter for general adult medical examination without abnormal findings: Secondary | ICD-10-CM | POA: Diagnosis not present

## 2015-12-23 DIAGNOSIS — H26493 Other secondary cataract, bilateral: Secondary | ICD-10-CM | POA: Diagnosis not present

## 2015-12-23 DIAGNOSIS — G43109 Migraine with aura, not intractable, without status migrainosus: Secondary | ICD-10-CM | POA: Diagnosis not present

## 2016-01-06 IMAGING — CT CT ABD-PELV W/ CM
2 of 5 series · 16 of 46 positions shown, 18 images · IV contrast (APPLIED)
Comparison: None.

CLINICAL DATA: Right lower quadrant pain, possible mass

EXAM:
CT ABDOMEN AND PELVIS WITH CONTRAST
TECHNIQUE: Multidetector CT imaging of the abdomen and pelvis was performed
using the standard protocol following bolus administration of
intravenous contrast.
Creatinine were obtained on site at [HOSPITAL] at
[HOSPITAL].
Results: Creatinine 0.9 mg/dL, GFR 60.
CONTRAST:  100mL [id number] IOPAMIDOL (AYCB6C-D99) INJECTION 61%

[Series 2: abd pelvis 5.0 i41s 1 · axial · 0.71mm/px · z∈[-426,-11]mm · 13 of 93 slices shown, 15 images]
[im 5/93  soft-tissue]
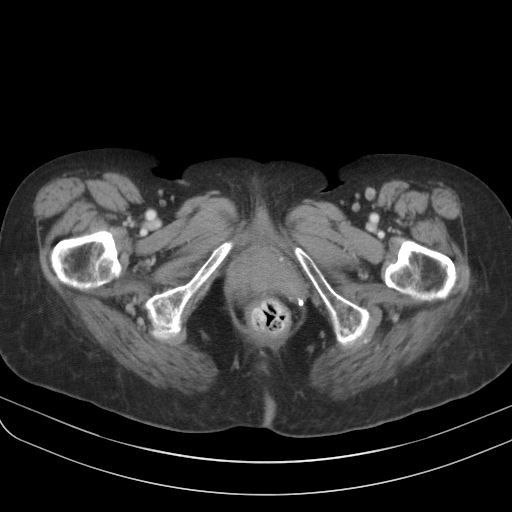
[im 5/93  bone]
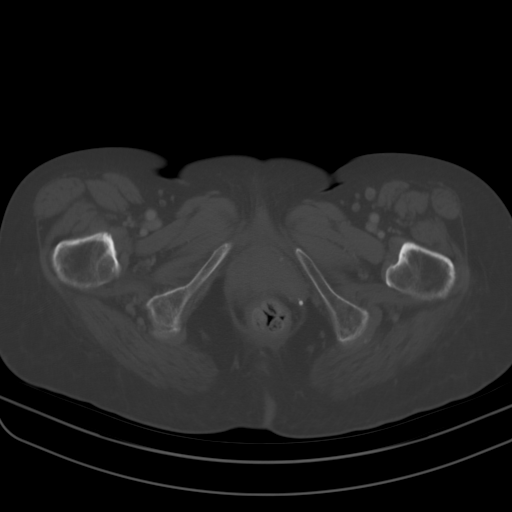
[im 15/93  soft-tissue]
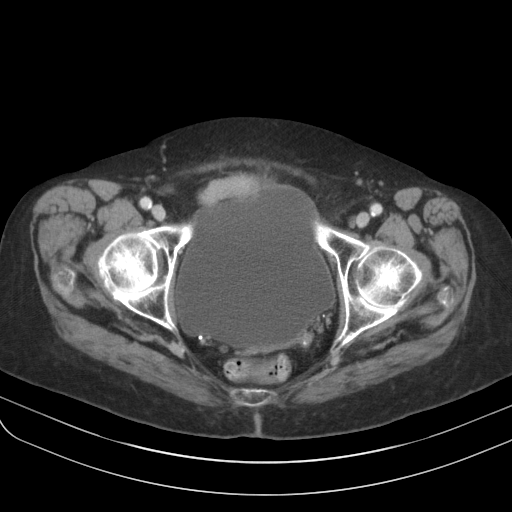
[im 20/93  soft-tissue]
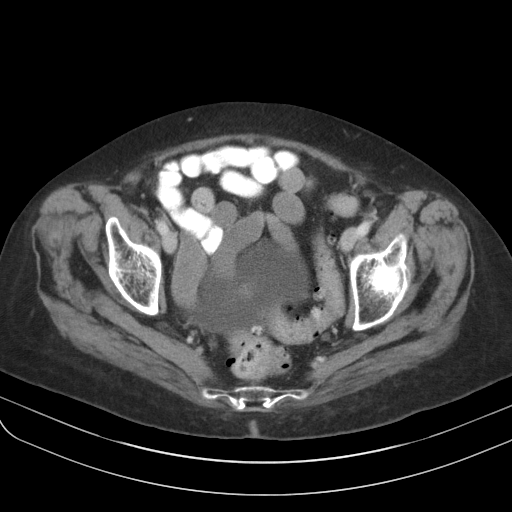
[im 25/93  soft-tissue]
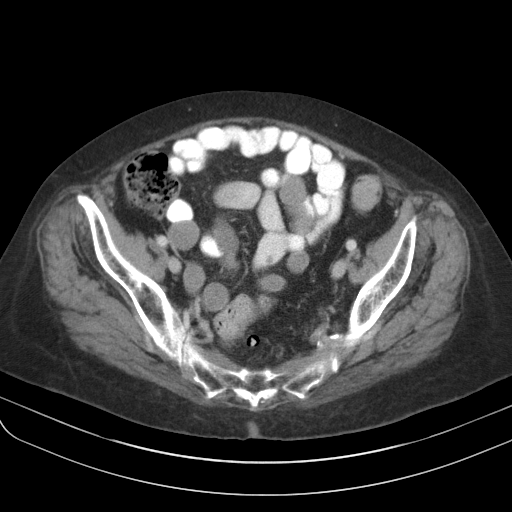
[im 34/93  soft-tissue]
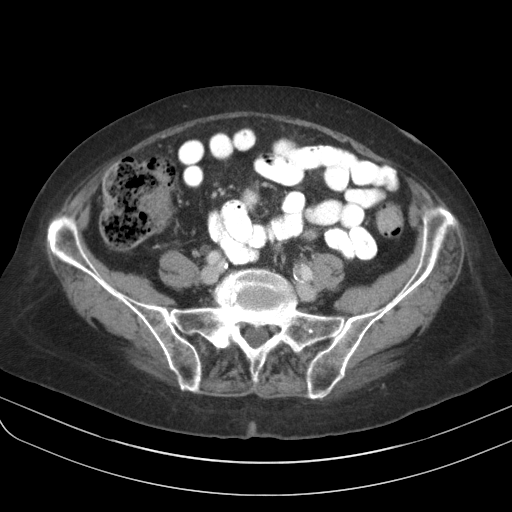
[im 39/93  soft-tissue]
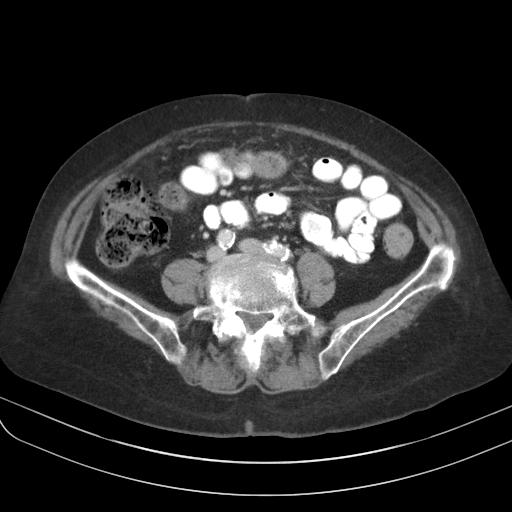
[im 49/93  soft-tissue]
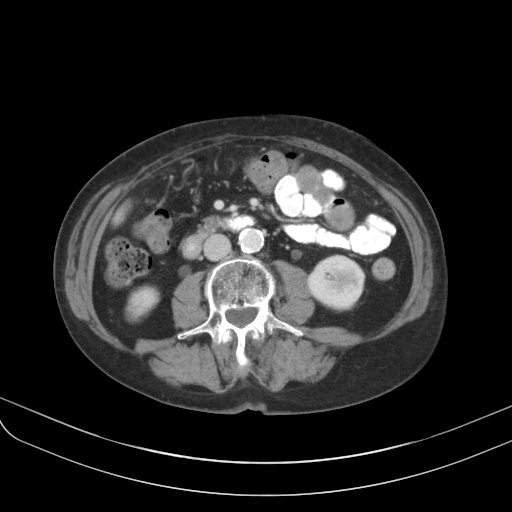
[im 54/93  soft-tissue]
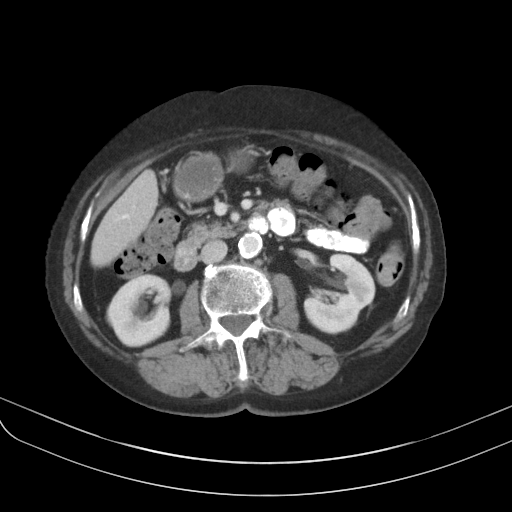
[im 59/93  soft-tissue]
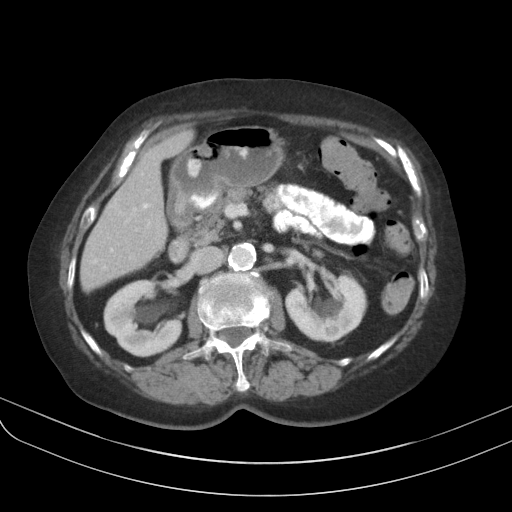
[im 59/93  bone]
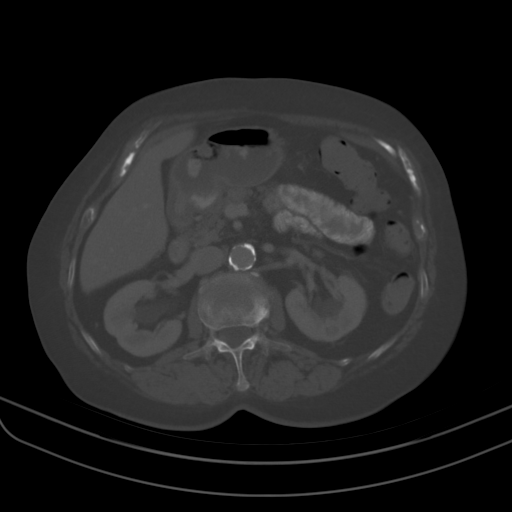
[im 68/93  soft-tissue]
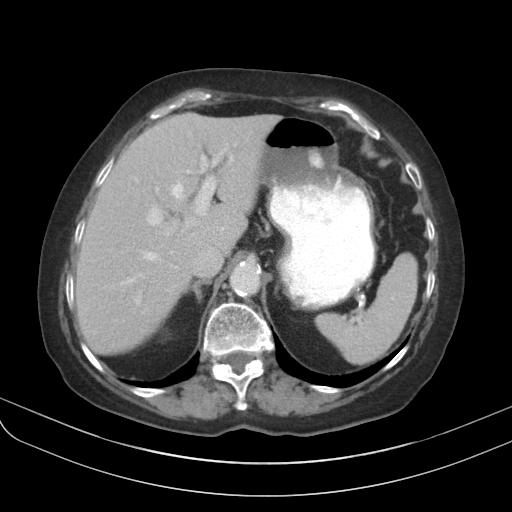
[im 73/93  soft-tissue]
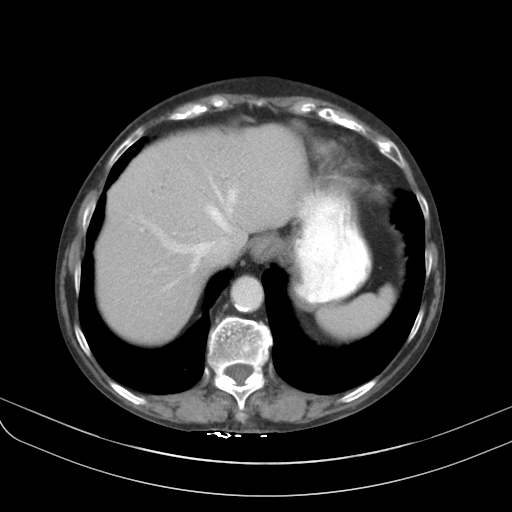
[im 78/93  soft-tissue]
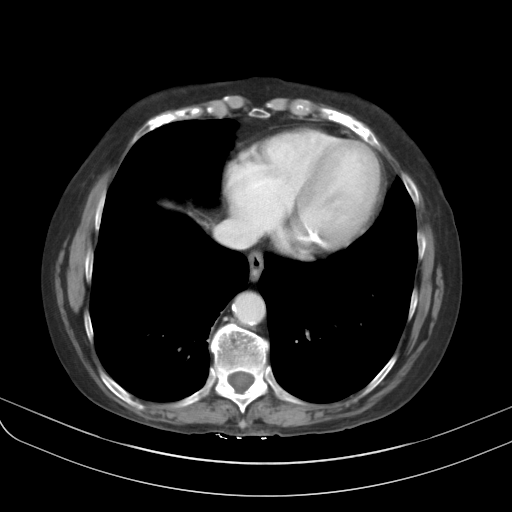
[im 88/93  soft-tissue]
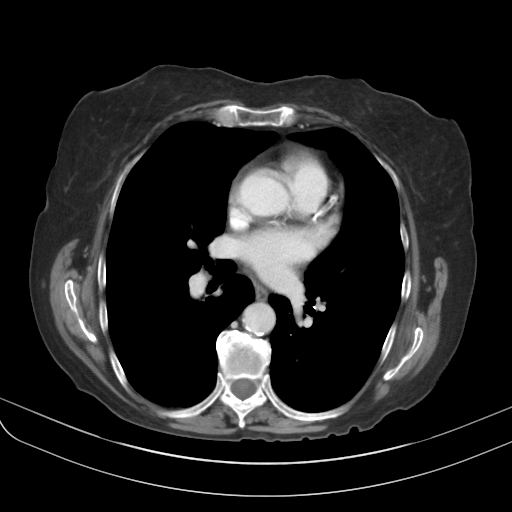

[Series 3: abd pelvis 3.0 spo cor · coronal · 0.71mm/px · 3 of 77 slices shown]
[im 26/77  soft-tissue]
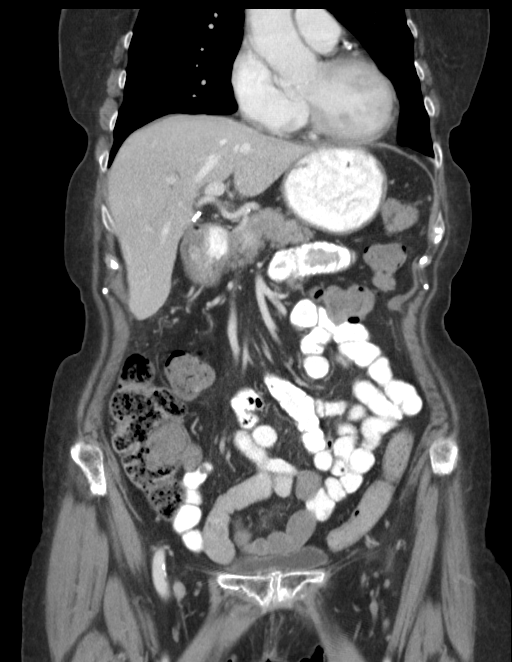
[im 34/77  soft-tissue]
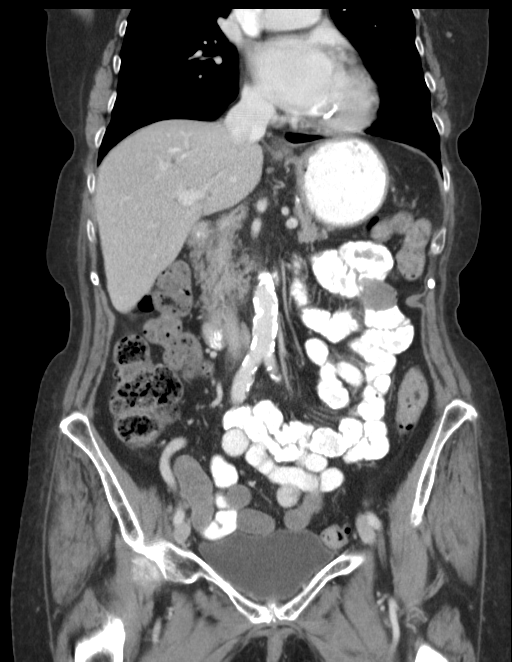
[im 43/77  soft-tissue]
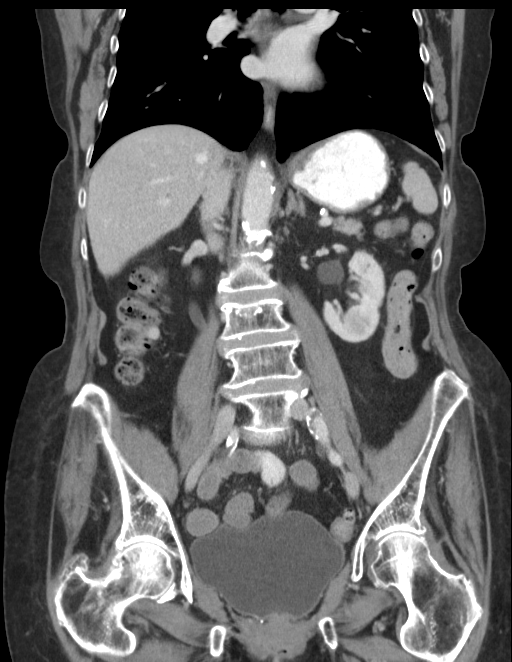

[16 of 46 positions shown; findings below may reference images not displayed]

FINDINGS: Lung bases are free of acute infiltrate or sizable effusion.

The gallbladder has been surgically removed. The liver, spleen,
adrenal glands and pancreas are all normal in their CT appearance.
The kidneys are well visualized and demonstrate a normal enhancement
pattern. No focal mass lesion is seen. No renal calculi or
obstructive changes are noted. The bladder is well distended
contributing to some mild fullness in the ureters although this does
not persist on delayed imaging.

Diverticulosis of the colon is seen. No findings to suggest
diverticulitis are noted. No colonic obstructive changes are seen.
Fecal material is noted throughout the colon. No obstructive changes
are noted although it would be difficult to exclude a soft tissue
mass lesion in this region. This corresponds to the area of the
patient's clinical concern. No right lower quadrant lymphadenopathy
is noted.

The appendix and uterus have been surgically removed. No pelvic mass
lesion is noted. Aortoiliac calcifications are seen without
aneurysmal dilatation. No acute bony abnormality is seen.
IMPRESSION: Soft tissue fullness in the region of the ileocecal valve. Although
this is likely related to the distal ileum and incomplete
distension, the possibility of an underlying mass would deserve
consideration as it lies in the area of the patient's clinical
concern. Further evaluation by means of direct visualization may be
helpful.

These results will be called to the ordering clinician or
representative by the Radiologist Assistant, and communication
documented in the PACS or zVision Dashboard.

## 2016-01-25 DIAGNOSIS — F418 Other specified anxiety disorders: Secondary | ICD-10-CM | POA: Diagnosis not present

## 2016-01-26 ENCOUNTER — Telehealth: Payer: Self-pay | Admitting: Internal Medicine

## 2016-01-26 NOTE — Telephone Encounter (Signed)
I left a message for the patient that Dr. Caryl Comes is out this week, but I will forward the message to him and ask that her call her back next week.

## 2016-01-26 NOTE — Telephone Encounter (Signed)
New message   Pt verbalized that she is calling for the doctor she wants to talk to him about her husbands passing  The call disconnected

## 2016-02-02 NOTE — Telephone Encounter (Signed)
Per Dr. Caryl Comes, he called and spoke with Mrs. Parzych.

## 2016-02-16 ENCOUNTER — Encounter (HOSPITAL_COMMUNITY): Payer: Self-pay | Admitting: *Deleted

## 2016-02-16 ENCOUNTER — Emergency Department (HOSPITAL_COMMUNITY)
Admission: EM | Admit: 2016-02-16 | Discharge: 2016-02-16 | Disposition: A | Discharge status: Home / Self Care | Payer: Medicare Other | Attending: Emergency Medicine | Admitting: Emergency Medicine

## 2016-02-16 DIAGNOSIS — I129 Hypertensive chronic kidney disease with stage 1 through stage 4 chronic kidney disease, or unspecified chronic kidney disease: Secondary | ICD-10-CM | POA: Insufficient documentation

## 2016-02-16 DIAGNOSIS — I10 Essential (primary) hypertension: Secondary | ICD-10-CM

## 2016-02-16 DIAGNOSIS — I252 Old myocardial infarction: Secondary | ICD-10-CM | POA: Insufficient documentation

## 2016-02-16 DIAGNOSIS — Z79899 Other long term (current) drug therapy: Secondary | ICD-10-CM | POA: Diagnosis not present

## 2016-02-16 DIAGNOSIS — E1122 Type 2 diabetes mellitus with diabetic chronic kidney disease: Secondary | ICD-10-CM | POA: Diagnosis not present

## 2016-02-16 DIAGNOSIS — N189 Chronic kidney disease, unspecified: Secondary | ICD-10-CM | POA: Insufficient documentation

## 2016-02-16 DIAGNOSIS — J45909 Unspecified asthma, uncomplicated: Secondary | ICD-10-CM | POA: Insufficient documentation

## 2016-02-16 DIAGNOSIS — Z96652 Presence of left artificial knee joint: Secondary | ICD-10-CM | POA: Diagnosis not present

## 2016-02-16 DIAGNOSIS — Z7901 Long term (current) use of anticoagulants: Secondary | ICD-10-CM | POA: Insufficient documentation

## 2016-02-16 LAB — COMPREHENSIVE METABOLIC PANEL
ALT: 15 U/L (ref 14–54)
AST: 24 U/L (ref 15–41)
Albumin: 3.9 g/dL (ref 3.5–5.0)
Alkaline Phosphatase: 60 U/L (ref 38–126)
Anion gap: 8 (ref 5–15)
BUN: 17 mg/dL (ref 6–20)
CO2: 25 mmol/L (ref 22–32)
Calcium: 9.7 mg/dL (ref 8.9–10.3)
Chloride: 102 mmol/L (ref 101–111)
Creatinine, Ser: 0.88 mg/dL (ref 0.44–1.00)
GFR calc Af Amer: >60 mL/min (ref >60)
GFR calc non Af Amer: 60 mL/min — ABNORMAL LOW (ref >60)
Glucose, Bld: 111 mg/dL — ABNORMAL HIGH (ref 65–99)
Potassium: 4.6 mmol/L (ref 3.5–5.1)
Sodium: 135 mmol/L (ref 135–145)
Total Bilirubin: 0.5 mg/dL (ref 0.3–1.2)
Total Protein: 6.6 g/dL (ref 6.5–8.1)

## 2016-02-16 LAB — CBC WITH DIFFERENTIAL/PLATELET
Basophils Absolute: 0 10*3/uL (ref 0.0–0.1)
Basophils Relative: 0 %
Eosinophils Absolute: 0.2 10*3/uL (ref 0.0–0.7)
Eosinophils Relative: 2 %
HCT: 36.1 % (ref 36.0–46.0)
Hemoglobin: 12.5 g/dL (ref 12.0–15.0)
Lymphocytes Relative: 36 %
Lymphs Abs: 2.8 10*3/uL (ref 0.7–4.0)
MCH: 30.9 pg (ref 26.0–34.0)
MCHC: 34.6 g/dL (ref 30.0–36.0)
MCV: 89.1 fL (ref 78.0–100.0)
Monocytes Absolute: 0.8 10*3/uL (ref 0.1–1.0)
Monocytes Relative: 10 %
Neutro Abs: 4 10*3/uL (ref 1.7–7.7)
Neutrophils Relative %: 52 %
Platelets: 337 10*3/uL (ref 150–400)
RBC: 4.05 MIL/uL (ref 3.87–5.11)
RDW: 13.2 % (ref 11.5–15.5)
WBC: 7.7 10*3/uL (ref 4.0–10.5)

## 2016-02-16 LAB — I-STAT TROPONIN, ED: Troponin i, poc: 0.01 ng/mL (ref 0.00–0.08)

## 2016-02-16 MED ORDER — ACETAMINOPHEN 325 MG PO TABS
650.0000 mg | ORAL_TABLET | Freq: Once | ORAL | Status: AC
  Administered 2016-02-16: 650 mg via ORAL
  Filled 2016-02-16: qty 2

## 2016-02-16 NOTE — ED Triage Notes (Signed)
Patient stated she took her BP at home and noticed her BP was elevated 231/89 and they started comeing to the hospital and then her head started hurting (frontal area)

## 2016-02-16 NOTE — ED Provider Notes (Signed)
[facility] DEPT Provider Note   CSN: [id number] Arrival date & time: 02/16/16  1942     History   Chief Complaint Chief Complaint  Patient presents with  . Hypertension    HPI Kelly Boone is a 82 y.o. female.  82 year old female presents from home with increased blood pressure states that her blood pressure was 231/89. Took an extra dose of her Diovan prior to arrival and now is now better Has had a mild frontal headache but without confusion or vomiting or neck pain. Denies any weakness in upper or lower extremities. Repeat blood pressure has improved here. Denies any recent medication changes. States that symptoms began when she was sitting down at home. Nothing seemed to make them worse. No recent history of trauma.      Past Medical History:  Diagnosis Date  . A-fib (HCC)    irregular heartbeat  . Allergic rhinitis   . Anxiety   . Arthritis   . Asthma   . Atopic dermatitis   . Benign essential hypertension   . Chronic kidney disease    hx of kidney stones as teenager  . Depression   . Disorders of magnesium metabolism   . DM (diabetes mellitus) ([place]) 2004  . Edema   . Encounter for long-term (current) use of other medications   . Esophagitis   . GERD (gastroesophageal reflux disease)   . Headache   . Hearing loss   . Hypercalcemia   . Hyperlipemia 1997  . Hypertension 1997  . Hyposmolality and/or hyponatremia   . Myalgia and myositis, unspecified   . Myocardial infarction    June 2015, was told caused by stress  . Need for prophylactic vaccination and inoculation against influenza   . Osteoporosis, unspecified   . Other screening mammogram   . Other syndromes affecting cervical region   . Pain in joint, lower leg   . Pain in joint, shoulder region   . Peripheral autonomic neuropathy in disorders classified elsewhere(337.1)   . Pneumonia    as infant  . Restless legs syndrome (RLS)   . Screening for depression   . Sinus bradycardia   . Special  screening for malignant neoplasms, colon   . Type 2 diabetes mellitus with autonomic neuropathy ([place])   . Unspecified essential hypertension   . Unspecified vitamin D deficiency   . Urinary tract infection, site not specified   . Varicose veins of lower extremities with inflammation     Patient Active Problem List   Diagnosis Date Noted  . History of total knee arthroplasty 10/14/2014  . Takotsubo syndrome 09/28/2013  . A-fib ([place])   . Hypertension   . Sinus bradycardia     Past Surgical History:  Procedure Laterality Date  . ABDOMINAL HYSTERECTOMY    . APPENDECTOMY    . benign breast biopsy    . CHOLECYSTECTOMY    . LEFT HEART CATHETERIZATION WITH CORONARY ANGIOGRAM N/A 09/30/2013   Procedure: LEFT HEART CATHETERIZATION WITH CORONARY ANGIOGRAM;  Surgeon: Troy Sine, MD;  Location: [facility] CATH LAB;  Service: Cardiovascular;  Laterality: N/A;  . OTHER SURGICAL HISTORY     Nuclear Stress Test with EF 70%, Chest Radiograph-lungs are clear without pleural effusions or focal consolidations. Trachea projects midline and there is no pneumothorax. Suture anchors in right humeral head.  Marland Kitchen SHOULDER SURGERY     Repair  . TONSILLECTOMY    . TOTAL KNEE ARTHROPLASTY Left 10/14/2014   Procedure: LEFT TOTAL KNEE ARTHROPLASTY;  Surgeon: Latanya Maudlin,  MD;  Location: [facility] ORS;  Service: Orthopedics;  Laterality: Left;    OB History    No data available       Home Medications    Prior to Admission medications   Medication Sig Start Date End Date Taking? Authorizing Provider  albuterol (PROVENTIL HFA;VENTOLIN HFA) 108 (90 BASE) MCG/ACT inhaler Inhale 2 puffs into the lungs every 6 (six) hours as needed for wheezing or shortness of breath.    Historical Provider, MD  amLODipine (NORVASC) 2.5 MG tablet TAKE ONE TABLET BY MOUTH ONCE DAILY 09/28/15   Deboraha Sprang, MD  BIOTIN PO Take 1,000 mg by mouth daily.     Historical Provider, MD  Cholecalciferol (VITAMIN D PO) Take 1,000 Units by mouth  daily.    Historical Provider, MD  cyanocobalamin 500 MCG tablet Take 500 mcg by mouth daily.    Historical Provider, MD  diazepam (VALIUM) 5 MG tablet Take 5 mg by mouth every 6 (six) hours as needed for anxiety.    Historical Provider, MD  donepezil (ARICEPT) 5 MG tablet Take 1 tablet by mouth daily. 09/28/15   Historical Provider, MD  ELIQUIS 2.5 MG TABS tablet TAKE ONE TABLET BY MOUTH TWICE DAILY 03/02/15   Deboraha Sprang, MD  furosemide (LASIX) 20 MG tablet TAKE ONE TABLET BY MOUTH ONCE DAILY AS NEEDED FOR  EDEMA 03/18/15   Deboraha Sprang, MD  losartan-hydrochlorothiazide [facility]) 50-12.5 MG tablet TAKE TWO TABLETS BY MOUTH ONCE DAILY 05/11/15   Deboraha Sprang, MD  Magnesium 250 MG TABS Take 2 tablets by mouth every morning.     Historical Provider, MD  metoprolol (LOPRESSOR) 25 MG tablet Take 0.5 tablets (12.5 mg total) by mouth 2 (two) times daily. 04/18/14   Carlisle Cater, PA-C  OVER THE COUNTER MEDICATION Take 1 tsp by mouth daily - restless leg syndrome.  OPC-3    Historical Provider, MD  oxyCODONE-acetaminophen (PERCOCET/ROXICET) 5-325 MG per tablet Take 0.5-1 tablets by mouth every 4 (four) hours as needed for severe pain. 10/16/14   Amber Cecilio Asper, PA-C  propranolol (INDERAL) 10 MG tablet Take 10 mg by mouth every 6 (six) hours as needed (increased heart rate and fibulation).    Historical Provider, MD  valsartan (DIOVAN) 80 MG tablet Take 1 tablet by mouth daily. 10/30/15   Historical Provider, MD    Family History Family History  Problem Relation Age of Onset  . CAD Brother   . Anemia Mother   . CAD Mother   . Hypertension Mother     Social History Social History  Substance Use Topics  . Smoking status: Never Smoker  . Smokeless tobacco: Never Used  . Alcohol use No     Allergies   Daypro [oxaprozin]; Statins; and Zetia [ezetimibe]   Review of Systems Review of Systems  All other systems reviewed and are negative.    Physical Exam Updated Vital Signs BP 190/77    Pulse 85   Temp 98 F (36.7 C) (Oral)   Resp 17   Ht 5\' 2"  (1.575 m)   Wt 65.8 kg   SpO2 98%   BMI 26.52 kg/m   Physical Exam  Constitutional: She is oriented to person, place, and time. She appears well-developed and well-nourished.  Non-toxic appearance. No distress.  HENT:  Head: Normocephalic and atraumatic.  Eyes: Conjunctivae, EOM and lids are normal. Pupils are equal, round, and reactive to light.  Neck: Normal range of motion. Neck supple. No tracheal deviation present. No thyroid mass  present.  Cardiovascular: Normal rate, regular rhythm and normal heart sounds.  Exam reveals no gallop.   No murmur heard. Pulmonary/Chest: Effort normal and breath sounds normal. No stridor. No respiratory distress. She has no decreased breath sounds. She has no wheezes. She has no rhonchi. She has no rales.  Abdominal: Soft. Normal appearance and bowel sounds are normal. She exhibits no distension. There is no tenderness. There is no rebound and no CVA tenderness.  Musculoskeletal: Normal range of motion. She exhibits no edema or tenderness.  Neurological: She is alert and oriented to person, place, and time. She has normal strength. No cranial nerve deficit or sensory deficit. GCS eye subscore is 4. GCS verbal subscore is 5. GCS motor subscore is 6.  Skin: Skin is warm and dry. No abrasion and no rash noted.  Psychiatric: She has a normal mood and affect. Her speech is normal and behavior is normal.  Nursing note and vitals reviewed.    ED Treatments / Results  Labs (all labs ordered are listed, but only abnormal results are displayed) Labs Reviewed  COMPREHENSIVE METABOLIC PANEL - Abnormal; Notable for the following:       Result Value   Glucose, Bld 111 (*)    GFR calc non Af Amer 60 (*)    All other components within normal limits  CBC WITH DIFFERENTIAL/PLATELET  I-STAT TROPOININ, ED    EKG  EKG Interpretation  Date/Time:  Wednesday February 16 2016 20:03:31 EST Ventricular  Rate:  74 PR Interval:  162 QRS Duration: 82 QT Interval:  362 QTC Calculation: 401 R Axis:   47 Text Interpretation:  Normal sinus rhythm Anterior infarct , age undetermined Abnormal ECG Confirmed by Caelen Higinbotham  MD, La Shehan ([id number]) on 02/16/2016 9:30:03 PM       Radiology No results found.  Procedures Procedures (including critical care time)  Medications Ordered in ED Medications - No data to display   Initial Impression / Assessment and Plan / ED Course  I have reviewed the triage vital signs and the nursing notes.  Pertinent labs & imaging results that were available during my care of the patient were reviewed by me and considered in my medical decision making (see chart for details).  Clinical Course   Patients repeat blood pressure for me in the room was 157/70. Patient has no focal neurological deficits. She is maintaining appropriate. Do not feel that she needs to have a head CT at this time. Lab work is reassuring. Did discuss the possibility of performing a head CT and she agrees to hold off on doing this at this time. We'll discharge home with follow-up with her primary care doctor. Return precautions given  Final Clinical Impressions(s) / ED Diagnoses   Final diagnoses:  None    New Prescriptions New Prescriptions   No medications on file     Lacretia Leigh, MD 02/16/16 [id number]

## 2016-02-16 NOTE — ED Notes (Signed)
Patient with equal hand grips, equal smile, answers questions appropriately

## 2016-02-23 DIAGNOSIS — G2581 Restless legs syndrome: Secondary | ICD-10-CM | POA: Diagnosis not present

## 2016-02-23 DIAGNOSIS — I10 Essential (primary) hypertension: Secondary | ICD-10-CM | POA: Diagnosis not present

## 2016-02-23 DIAGNOSIS — F418 Other specified anxiety disorders: Secondary | ICD-10-CM | POA: Diagnosis not present

## 2016-03-02 ENCOUNTER — Other Ambulatory Visit: Payer: Self-pay | Admitting: Internal Medicine

## 2016-05-09 DIAGNOSIS — E785 Hyperlipidemia, unspecified: Secondary | ICD-10-CM | POA: Diagnosis not present

## 2016-05-09 DIAGNOSIS — G2581 Restless legs syndrome: Secondary | ICD-10-CM | POA: Diagnosis not present

## 2016-05-09 DIAGNOSIS — Z79899 Other long term (current) drug therapy: Secondary | ICD-10-CM | POA: Diagnosis not present

## 2016-05-09 DIAGNOSIS — E1169 Type 2 diabetes mellitus with other specified complication: Secondary | ICD-10-CM | POA: Diagnosis not present

## 2016-05-09 DIAGNOSIS — F418 Other specified anxiety disorders: Secondary | ICD-10-CM | POA: Diagnosis not present

## 2016-05-12 DIAGNOSIS — J329 Chronic sinusitis, unspecified: Secondary | ICD-10-CM | POA: Diagnosis not present

## 2016-07-31 DIAGNOSIS — N390 Urinary tract infection, site not specified: Secondary | ICD-10-CM | POA: Diagnosis not present

## 2016-07-31 DIAGNOSIS — M7061 Trochanteric bursitis, right hip: Secondary | ICD-10-CM | POA: Diagnosis not present

## 2016-07-31 DIAGNOSIS — M79605 Pain in left leg: Secondary | ICD-10-CM | POA: Diagnosis not present

## 2016-07-31 DIAGNOSIS — R109 Unspecified abdominal pain: Secondary | ICD-10-CM | POA: Diagnosis not present

## 2016-07-31 DIAGNOSIS — M79606 Pain in leg, unspecified: Secondary | ICD-10-CM | POA: Diagnosis not present

## 2016-08-11 DIAGNOSIS — J4521 Mild intermittent asthma with (acute) exacerbation: Secondary | ICD-10-CM | POA: Diagnosis not present

## 2016-10-26 DIAGNOSIS — H26493 Other secondary cataract, bilateral: Secondary | ICD-10-CM | POA: Diagnosis not present

## 2016-11-23 DIAGNOSIS — I10 Essential (primary) hypertension: Secondary | ICD-10-CM | POA: Diagnosis not present

## 2016-11-23 DIAGNOSIS — E7800 Pure hypercholesterolemia, unspecified: Secondary | ICD-10-CM | POA: Diagnosis not present

## 2016-11-23 DIAGNOSIS — Z9889 Other specified postprocedural states: Secondary | ICD-10-CM | POA: Diagnosis not present

## 2016-11-23 DIAGNOSIS — E1169 Type 2 diabetes mellitus with other specified complication: Secondary | ICD-10-CM | POA: Diagnosis not present

## 2016-11-23 DIAGNOSIS — Z0000 Encounter for general adult medical examination without abnormal findings: Secondary | ICD-10-CM | POA: Diagnosis not present

## 2016-12-02 ENCOUNTER — Other Ambulatory Visit: Payer: Self-pay | Admitting: Internal Medicine

## 2016-12-05 ENCOUNTER — Other Ambulatory Visit: Payer: Self-pay | Admitting: *Deleted

## 2016-12-05 ENCOUNTER — Telehealth: Payer: Self-pay | Admitting: Internal Medicine

## 2016-12-05 NOTE — Telephone Encounter (Signed)
Called pt she states the bottle for Eliquis she has states 5mg  BID but it is from 2016, at which time pt was taking 5mg  BID.  Per Dr Olin Pia OV on 06/30/14 pt was switched from 5mg  BID to 2.5mg  BID secondary to age and weight 65kg.  Specified criteria actually states weight less than 60kg, which pt's weight is not.   Pt last saw Dr Caryl Comes 11/23/15, pt has f/u appt scheduled for 12/29/16.  Last labs 02/16/16 Creat 0.88, note placed on upcoming appt with Dr Caryl Comes to repeat CBC and BMP at [place] pt is overdue for 6 month follow-up Eliquis labwork.  Age 83, weight 65.8kg, based on specified criteria pt is not on appropriate dosing of Eliquis, pt is taking 2.5mg  BID but should actually be taking 5mg  BID.    Staff message sent to Dr Caryl Comes to request dosage change to appropriate dosage.

## 2016-12-05 NOTE — Telephone Encounter (Signed)
New message       *STAT* If patient is at the pharmacy, call can be transferred to refill team.   1. Which medications need to be refilled? (please list name of each medication and dose if known)  eliquis 2.5mg  2. Which pharmacy/location (including street and city if local pharmacy) is medication to be sent to?  [facility] in [place]  3. Do they need a 30 day or 90 day supply? 30 day

## 2016-12-05 NOTE — Telephone Encounter (Signed)
Patient called back and stated that she has been taking eliquis 5mg  bid per her bottle.

## 2016-12-06 MED ORDER — APIXABAN 5 MG PO TABS: 5.0000 mg | ORAL_TABLET | Freq: Two times a day (BID) | ORAL | 1 refills | Status: DC

## 2016-12-06 NOTE — Telephone Encounter (Signed)
Per Dr. Caryl Comes ok to increase to 5mg  BID. Spoke with patient and made aware. She states appreciation.  RX sent to pharmacy of choice.

## 2016-12-15 ENCOUNTER — Encounter: Payer: Self-pay | Admitting: Internal Medicine

## 2016-12-29 ENCOUNTER — Ambulatory Visit (INDEPENDENT_AMBULATORY_CARE_PROVIDER_SITE_OTHER): Payer: Medicare Other | Admitting: Internal Medicine

## 2016-12-29 ENCOUNTER — Encounter: Payer: Self-pay | Admitting: Internal Medicine

## 2016-12-29 VITALS — BP 164/70 | HR 79 | Ht 62.0 in | Wt 162.0 lb

## 2016-12-29 DIAGNOSIS — R001 Bradycardia, unspecified: Secondary | ICD-10-CM | POA: Diagnosis not present

## 2016-12-29 DIAGNOSIS — I10 Essential (primary) hypertension: Secondary | ICD-10-CM | POA: Diagnosis not present

## 2016-12-29 DIAGNOSIS — I480 Paroxysmal atrial fibrillation: Secondary | ICD-10-CM | POA: Diagnosis not present

## 2016-12-29 LAB — BASIC METABOLIC PANEL
BUN/Creatinine Ratio: 18 (ref 12–28)
BUN: 16 mg/dL (ref 8–27)
CO2: 23 mmol/L (ref 20–29)
Calcium: 9.6 mg/dL (ref 8.7–10.3)
Chloride: 97 mmol/L (ref 96–106)
Creatinine, Ser: 0.89 mg/dL (ref 0.57–1.00)
GFR calc Af Amer: 70 mL/min/{1.73_m2} (ref >59)
GFR calc non Af Amer: 61 mL/min/{1.73_m2} (ref >59)
Glucose: 95 mg/dL (ref 65–99)
Potassium: 4.3 mmol/L (ref 3.5–5.2)
Sodium: 136 mmol/L (ref 134–144)

## 2016-12-29 LAB — CBC WITH DIFFERENTIAL/PLATELET
Basophils Absolute: 0 10*3/uL (ref 0.0–0.2)
Basos: 0 %
EOS (ABSOLUTE): 0.2 10*3/uL (ref 0.0–0.4)
Eos: 3 %
Hematocrit: 36.7 % (ref 34.0–46.6)
Hemoglobin: 13.1 g/dL (ref 11.1–15.9)
Immature Grans (Abs): 0 10*3/uL (ref 0.0–0.1)
Immature Granulocytes: 0 %
Lymphocytes Absolute: 2.3 10*3/uL (ref 0.7–3.1)
Lymphs: 31 %
MCH: 32.2 pg (ref 26.6–33.0)
MCHC: 35.7 g/dL (ref 31.5–35.7)
MCV: 90 fL (ref 79–97)
Monocytes Absolute: 0.8 10*3/uL (ref 0.1–0.9)
Monocytes: 11 %
Neutrophils Absolute: 4.2 10*3/uL (ref 1.4–7.0)
Neutrophils: 55 %
Platelets: 342 10*3/uL (ref 150–379)
RBC: 4.07 x10E6/uL (ref 3.77–5.28)
RDW: 13.3 % (ref 12.3–15.4)
WBC: 7.6 10*3/uL (ref 3.4–10.8)

## 2016-12-29 LAB — MAGNESIUM: Magnesium: 2.1 mg/dL (ref 1.6–2.3)

## 2016-12-29 MED ORDER — LABETALOL HCL 200 MG PO TABS: 200.0000 mg | ORAL_TABLET | Freq: Two times a day (BID) | ORAL | 3 refills | Status: DC

## 2016-12-29 NOTE — Patient Instructions (Addendum)
Medication Instructions: Your physician has recommended you make the following change in your medication:  -1) STOP Metoprolol Tartrate (lopressor)  -2) START Labetalol (Normodyne) 200 mg - Take 1 tablet (200 mg) by mouth twice daily  Labwork: Your physician recommends that you have lab work today: BMET, CBC, and Magnesium Panel  Procedures/Testing: None Ordered  Follow-Up: Your physician wants you to follow-up in: 1 YEAR with Tommye Standard, PA. You will receive a reminder letter in the mail two months in advance. If you don't receive a letter, please call our office to schedule the follow-up appointment.   If you need a refill on your cardiac medications before your next appointment, please call your pharmacy.  Marland Kitchenins

## 2016-12-29 NOTE — Progress Notes (Signed)
Okay could remember      Patient Care Team: Ronita Hipps, MD as PCP - General (Family Medicine)   HPI  Kelly Boone is a 83 y.o. female Seen in followup for atrial fibrillation; she is on Rivaroxaban , losartan and Mag and aldactone.  She apparently underwent Myoview scanning and echocardiography which were unrevealing  reports were reviewed. Interestingly her left atrial dimension was normal.  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  Her atrial fibrillation is paroxysmal; she has some baseline sinus bradycardia     No significant interval atrial fibrillation  Blood pressure home have been 140-170.  She has chronic low level edema. She denies shortness of breath but is not very active. She does get out however. Her husband died last February 27, 2023. [name] been married 73 years and have dated for 40 years before then, beginning when she was 88 and he was 77.  She is currently on apixaban; bruising but no significant bleeding  Date Cr K Hgb  11/17 0.88 4.6 12.5             Past Medical History:  Diagnosis Date  . A-fib (HCC)    irregular heartbeat  . Allergic rhinitis   . Anxiety   . Arthritis   . Asthma   . Atopic dermatitis   . Benign essential hypertension   . Chronic kidney disease    hx of kidney stones as teenager  . Depression   . Disorders of magnesium metabolism   . DM (diabetes mellitus) ([place]) 2004  . Edema   . Encounter for long-term (current) use of other medications   . Esophagitis   . GERD (gastroesophageal reflux disease)   . Headache   . Hearing loss   . Hypercalcemia   . Hyperlipemia 1997  . Hypertension 1997  . Hyposmolality and/or hyponatremia   . Myalgia and myositis, unspecified   . Myocardial infarction [facility])    June 2015, was told caused by stress  . Need for prophylactic vaccination and inoculation against influenza   . Osteoporosis,  unspecified   . Other screening mammogram   . Other syndromes affecting cervical region   . Pain in joint, lower leg   . Pain in joint, shoulder region   . Peripheral autonomic neuropathy in disorders classified elsewhere(337.1)   . Pneumonia    as infant  . Restless legs syndrome (RLS)   . Screening for depression   . Sinus bradycardia   . Special screening for malignant neoplasms, colon   . Type 2 diabetes mellitus with autonomic neuropathy ([place])   . Unspecified essential hypertension   . Unspecified vitamin D deficiency   . Urinary tract infection, site not specified   . Varicose veins of lower extremities with inflammation     Past Surgical History:  Procedure Laterality Date  . ABDOMINAL HYSTERECTOMY    . APPENDECTOMY    . benign breast biopsy    . CHOLECYSTECTOMY    . LEFT HEART CATHETERIZATION WITH CORONARY ANGIOGRAM N/A 09/30/2013   Procedure: LEFT HEART CATHETERIZATION WITH CORONARY ANGIOGRAM;  Surgeon: Troy Sine, MD;  Location: [facility] CATH LAB;  Service: Cardiovascular;  Laterality: N/A;  . OTHER SURGICAL HISTORY     Nuclear Stress Test with EF 70%, Chest Radiograph-lungs are clear without pleural effusions or focal consolidations. Trachea projects midline and there is no pneumothorax. Suture anchors in right humeral head.  Marland Kitchen SHOULDER SURGERY  Repair  . TONSILLECTOMY    . TOTAL KNEE ARTHROPLASTY Left 10/14/2014   Procedure: LEFT TOTAL KNEE ARTHROPLASTY;  Surgeon: Latanya Maudlin, MD;  Location: [facility] ORS;  Service: Orthopedics;  Laterality: Left;    Current Outpatient Prescriptions  Medication Sig Dispense Refill  . albuterol (PROVENTIL HFA;VENTOLIN HFA) 108 (90 BASE) MCG/ACT inhaler Inhale 2 puffs into the lungs every 6 (six) hours as needed for wheezing or shortness of breath.    Marland Kitchen amLODipine (NORVASC) 2.5 MG tablet TAKE ONE TABLET BY MOUTH ONCE DAILY 30 tablet 5  . apixaban (ELIQUIS) 5 MG TABS tablet Take 1 tablet (5 mg total) by mouth 2 (two) times daily. 180  tablet 1  . BIOTIN PO Take 1,000 mg by mouth daily.     . Cholecalciferol (VITAMIN D PO) Take 1,000 Units by mouth daily.    . cyanocobalamin 500 MCG tablet Take 500 mcg by mouth daily.    . diazepam (VALIUM) 5 MG tablet Take 5 mg by mouth every 6 (six) hours as needed for anxiety.    . furosemide (LASIX) 20 MG tablet TAKE ONE TABLET BY MOUTH ONCE DAILY AS NEEDED FOR  EDEMA 30 tablet 10  . losartan-hydrochlorothiazide (HYZAAR) 50-12.5 MG tablet TAKE TWO TABLETS BY MOUTH ONCE DAILY 60 tablet 9  . Magnesium 250 MG TABS Take 2 tablets by mouth every morning.     . metoprolol (LOPRESSOR) 25 MG tablet Take 0.5 tablets (12.5 mg total) by mouth 2 (two) times daily. 30 tablet 0  . oxyCODONE-acetaminophen (PERCOCET/ROXICET) 5-325 MG per tablet Take 0.5-1 tablets by mouth every 4 (four) hours as needed for severe pain. 60 tablet 0   No current facility-administered medications for this visit.     Allergies  Allergen Reactions  . Daypro [Oxaprozin] Other (See Comments)    Palpitation  . Statins Other (See Comments)    Myalgias  Patient denies this allergy at preop visit 10-08-14  . Zetia [Ezetimibe] Other (See Comments)    Joint Pain, Swelling    Review of Systems negative except from HPI and PMH  Physical Exam BP (!) 164/70   Pulse 79   Ht 5\' 2"  (1.575 m)   Wt 162 lb (73.5 kg)   SpO2 96%   BMI 29.63 kg/m  Well developed and nourished in no acute distress HENT normal Neck supple with JVP-flat Clear Regular rate and rhythm, no murmurs or gallops Abd-soft with active BS No Clubbing cyanosis 1+ edema Skin-warm and dry A & Oriented  Grossly normal sensory and motor function   ECG demonstrated sinus rhythm at 65 Intervals 17/09/38 Axis -7   Assessment and  Plan  Atrial fibrillation-paroxysmal   Sinus bradycardia  Hypertension-   Diabetes   Heart rhythm issues stable  BP remains elevated at home 140-160  Loletha Grayer is not so much a problem so will stop metop and add labetolol  for better BP control  Will check surveillance labs on  anticoagulation   apixoban dosing appropriate for age   She takes her diuretic prn

## 2017-01-01 ENCOUNTER — Telehealth: Payer: Self-pay

## 2017-01-01 NOTE — Telephone Encounter (Signed)
Pt is aware and agreeable to normal results  

## 2017-01-04 ENCOUNTER — Telehealth: Payer: Self-pay | Admitting: Internal Medicine

## 2017-01-04 MED ORDER — METOPROLOL TARTRATE 25 MG PO TABS
25.0000 mg | ORAL_TABLET | Freq: Two times a day (BID) | ORAL | 6 refills | Status: DC
Start: 2017-01-04 — End: 2017-08-11

## 2017-01-04 NOTE — Telephone Encounter (Signed)
New Message     Pt c/o medication issue:  1. Name of Medication:   labetalol (NORMODYNE) 200 MG tablet Take 1 tablet (200 mg total) by mouth 2 (two) times daily     2. How are you currently taking this medication (dosage and times per day)?   2x a day   3. Are you having a reaction (difficulty breathing--STAT)?  yes  4. What is your medication issue?   Causing her feet to swell

## 2017-01-04 NOTE — Telephone Encounter (Signed)
There is a low incidence but yes, labetalol can cause lower extremity edema (reported incidence ~2% but listed in package insert as 1-10%).

## 2017-01-04 NOTE — Telephone Encounter (Signed)
Kelly Boone,   This patient was just started on labetalol on 12/29/16. Does it cause lower extremity edema?

## 2017-01-04 NOTE — Telephone Encounter (Signed)
Reviewed the information below with Dr. Harrington Challenger (DOD)- orders received to have the patient stop labetalol and restart metoprolol tartrate at 25 mg BID.  I have notified the patient of the above and she is agreeable.  She is aware to monitor her BP and HR on the slightly higher dose of metoprolol from what she was taking.  RX sent to [facility] in [place], [place].

## 2017-01-19 DIAGNOSIS — Z23 Encounter for immunization: Secondary | ICD-10-CM | POA: Diagnosis not present

## 2017-03-04 DIAGNOSIS — J209 Acute bronchitis, unspecified: Secondary | ICD-10-CM | POA: Diagnosis not present

## 2017-03-26 DIAGNOSIS — I252 Old myocardial infarction: Secondary | ICD-10-CM | POA: Diagnosis not present

## 2017-03-26 DIAGNOSIS — I10 Essential (primary) hypertension: Secondary | ICD-10-CM | POA: Diagnosis not present

## 2017-03-26 DIAGNOSIS — I4891 Unspecified atrial fibrillation: Secondary | ICD-10-CM | POA: Diagnosis not present

## 2017-03-26 DIAGNOSIS — E7800 Pure hypercholesterolemia, unspecified: Secondary | ICD-10-CM | POA: Diagnosis not present

## 2017-03-26 DIAGNOSIS — R609 Edema, unspecified: Secondary | ICD-10-CM | POA: Diagnosis not present

## 2017-03-26 DIAGNOSIS — M7989 Other specified soft tissue disorders: Secondary | ICD-10-CM | POA: Diagnosis not present

## 2017-03-26 DIAGNOSIS — R2243 Localized swelling, mass and lump, lower limb, bilateral: Secondary | ICD-10-CM | POA: Diagnosis not present

## 2017-04-05 DIAGNOSIS — R600 Localized edema: Secondary | ICD-10-CM | POA: Diagnosis not present

## 2017-04-05 DIAGNOSIS — Z6830 Body mass index (BMI) 30.0-30.9, adult: Secondary | ICD-10-CM | POA: Diagnosis not present

## 2017-05-07 DIAGNOSIS — Z79899 Other long term (current) drug therapy: Secondary | ICD-10-CM | POA: Diagnosis not present

## 2017-05-07 DIAGNOSIS — E785 Hyperlipidemia, unspecified: Secondary | ICD-10-CM | POA: Diagnosis not present

## 2017-05-07 DIAGNOSIS — E1169 Type 2 diabetes mellitus with other specified complication: Secondary | ICD-10-CM | POA: Diagnosis not present

## 2017-05-07 DIAGNOSIS — R413 Other amnesia: Secondary | ICD-10-CM | POA: Diagnosis not present

## 2017-05-07 DIAGNOSIS — I10 Essential (primary) hypertension: Secondary | ICD-10-CM | POA: Diagnosis not present

## 2017-05-31 ENCOUNTER — Other Ambulatory Visit: Payer: Self-pay | Admitting: Internal Medicine

## 2017-05-31 NOTE — Telephone Encounter (Signed)
Pt last saw Dr Caryl Comes 12/29/16, last labs 12/29/16 Creat 0.89, age 84, weight 73.5kg, based on specified criteria pt is on appropriate dosage of Eliquis 5mg  BID.  Will refill rx.

## 2017-06-13 DIAGNOSIS — J069 Acute upper respiratory infection, unspecified: Secondary | ICD-10-CM | POA: Diagnosis not present

## 2017-06-13 DIAGNOSIS — Z6830 Body mass index (BMI) 30.0-30.9, adult: Secondary | ICD-10-CM | POA: Diagnosis not present

## 2017-07-05 DIAGNOSIS — Z6830 Body mass index (BMI) 30.0-30.9, adult: Secondary | ICD-10-CM | POA: Diagnosis not present

## 2017-07-05 DIAGNOSIS — R251 Tremor, unspecified: Secondary | ICD-10-CM | POA: Diagnosis not present

## 2017-08-11 ENCOUNTER — Other Ambulatory Visit: Payer: Self-pay | Admitting: Internal Medicine

## 2017-12-06 ENCOUNTER — Telehealth: Payer: Self-pay | Admitting: Internal Medicine

## 2017-12-06 ENCOUNTER — Other Ambulatory Visit: Payer: Self-pay | Admitting: Internal Medicine

## 2017-12-06 MED ORDER — APIXABAN 5 MG PO TABS: 5.0000 mg | ORAL_TABLET | Freq: Two times a day (BID) | ORAL | 0 refills | Status: DC

## 2017-12-06 NOTE — Telephone Encounter (Addendum)
Eliquis 5mg  refill request received; pt is 84 yrs old, wt-73.5kg, Crea-0.89 on 12/29/16, last seen by Dr. Caryl Comes on 12/29/16; will send in refill to requested pharmacy via refill request that has been received.

## 2017-12-06 NOTE — Addendum Note (Signed)
Addended by: Derrel Nip B on: 12/06/2017 01:03 PM   Modules accepted: Orders

## 2017-12-06 NOTE — Telephone Encounter (Signed)
New Message        *STAT* If patient is at the pharmacy, call can be transferred to refill team.   1. Which medications need to be refilled? (please list name of each medication and dose if known) Eliquis 5 mg  2. Which pharmacy/location (including street and city if local pharmacy) is medication to be sent to? [facility] [place]  3. Do they need a 30 day or 90 day supply? [place]

## 2017-12-13 DIAGNOSIS — L0390 Cellulitis, unspecified: Secondary | ICD-10-CM | POA: Diagnosis not present

## 2017-12-26 DIAGNOSIS — Z23 Encounter for immunization: Secondary | ICD-10-CM | POA: Diagnosis not present

## 2018-01-28 ENCOUNTER — Encounter: Payer: Self-pay | Admitting: Internal Medicine

## 2018-01-28 ENCOUNTER — Ambulatory Visit (INDEPENDENT_AMBULATORY_CARE_PROVIDER_SITE_OTHER): Payer: Medicare Other | Admitting: Internal Medicine

## 2018-01-28 VITALS — BP 130/74 | HR 56 | Ht 62.0 in | Wt 174.4 lb

## 2018-01-28 DIAGNOSIS — I252 Old myocardial infarction: Secondary | ICD-10-CM | POA: Diagnosis not present

## 2018-01-28 DIAGNOSIS — R0602 Shortness of breath: Secondary | ICD-10-CM | POA: Diagnosis not present

## 2018-01-28 DIAGNOSIS — I10 Essential (primary) hypertension: Secondary | ICD-10-CM

## 2018-01-28 DIAGNOSIS — G2581 Restless legs syndrome: Secondary | ICD-10-CM

## 2018-01-28 DIAGNOSIS — I480 Paroxysmal atrial fibrillation: Secondary | ICD-10-CM

## 2018-01-28 DIAGNOSIS — R001 Bradycardia, unspecified: Secondary | ICD-10-CM

## 2018-01-28 DIAGNOSIS — E119 Type 2 diabetes mellitus without complications: Secondary | ICD-10-CM | POA: Diagnosis not present

## 2018-01-28 DIAGNOSIS — I4891 Unspecified atrial fibrillation: Secondary | ICD-10-CM | POA: Diagnosis not present

## 2018-01-28 DIAGNOSIS — R251 Tremor, unspecified: Secondary | ICD-10-CM | POA: Diagnosis not present

## 2018-01-28 LAB — BASIC METABOLIC PANEL
BUN/Creatinine Ratio: 10 — ABNORMAL LOW (ref 12–28)
BUN: 9 mg/dL (ref 8–27)
CO2: 23 mmol/L (ref 20–29)
Calcium: 9.2 mg/dL (ref 8.7–10.3)
Chloride: 97 mmol/L (ref 96–106)
Creatinine, Ser: 0.86 mg/dL (ref 0.57–1.00)
GFR calc Af Amer: 72 mL/min/{1.73_m2} (ref >59)
GFR calc non Af Amer: 63 mL/min/{1.73_m2} (ref >59)
Glucose: 96 mg/dL (ref 65–99)
Potassium: 4.5 mmol/L (ref 3.5–5.2)
Sodium: 136 mmol/L (ref 134–144)

## 2018-01-28 LAB — CBC
Hematocrit: 32.8 % — ABNORMAL LOW (ref 34.0–46.6)
Hemoglobin: 11.1 g/dL (ref 11.1–15.9)
MCH: 30.8 pg (ref 26.6–33.0)
MCHC: 33.8 g/dL (ref 31.5–35.7)
MCV: 91 fL (ref 79–97)
Platelets: 347 10*3/uL (ref 150–450)
RBC: 3.6 x10E6/uL — ABNORMAL LOW (ref 3.77–5.28)
RDW: 12.8 % (ref 12.3–15.4)
WBC: 7.3 10*3/uL (ref 3.4–10.8)

## 2018-01-28 NOTE — Patient Instructions (Signed)
Medication Instructions:   Your physician recommends that you continue on your current medications as directed. Please refer to the Current Medication list given to you today.  Labwork:  You will have labs drawn today: CBC and BMP  Testing/Procedures:  None ordered.  Follow-Up:   Your physician recommends that you schedule a follow-up appointment in:   One Year with Tommye Standard, PA  A neurology referral for restless legs.    Any Other Special Instructions Will Be Listed Below (If Applicable).     If you need a refill on your cardiac medications before your next appointment, please call your pharmacy.

## 2018-01-28 NOTE — Progress Notes (Signed)
Okay could remember      Patient Care Team: Ronita Hipps, MD as PCP - General (Family Medicine)   HPI  Kelly Boone is a 84 y.o. female Seen in followup for atrial fibrillation; she is on apixoban  losartan and Mag and aldactone.  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  Her atrial fibrillation is paroxysmal; she has some baseline sinus bradycardia       Infrequent atrial fibrillation couple times a year  Episodic chest pains unrelated to exertion  Blood pressure home have been 140-170.  She has chronic lower extremity left greater than right edema.  She has restless legs but over the last month and started developing diffuse shaking which is uncontrollable.  Denies any new medications or changes in medications  . She does get out however. Her husband died February 17, 2016 [name] been married 22 years and have dated for 40 years before then, beginning when she was 89 and he was 79.    Date Cr K Hgb  11/17 0.88 4.6 12.5             Past Medical History:  Diagnosis Date  . A-fib (HCC)    irregular heartbeat  . Allergic rhinitis   . Anxiety   . Arthritis   . Asthma   . Atopic dermatitis   . Benign essential hypertension   . Chronic kidney disease    hx of kidney stones as teenager  . Depression   . Disorders of magnesium metabolism   . DM (diabetes mellitus) ([place]) 2004  . Edema   . Encounter for long-term (current) use of other medications   . Esophagitis   . GERD (gastroesophageal reflux disease)   . Headache   . Hearing loss   . Hypercalcemia   . Hyperlipemia 1997  . Hypertension 1997  . Hyposmolality and/or hyponatremia   . Myalgia and myositis, unspecified   . Myocardial infarction [facility])    June 2015, was told caused by stress  . Need for prophylactic vaccination and  inoculation against influenza   . Osteoporosis, unspecified   . Other screening mammogram   . Other syndromes affecting cervical region   . Pain in joint, lower leg   . Pain in joint, shoulder region   . Peripheral autonomic neuropathy in disorders classified elsewhere(337.1)   . Pneumonia    as infant  . Restless legs syndrome (RLS)   . Screening for depression   . Sinus bradycardia   . Special screening for malignant neoplasms, colon   . Type 2 diabetes mellitus with autonomic neuropathy ([place])   . Unspecified essential hypertension   . Unspecified vitamin D deficiency   . Urinary tract infection, site not specified   . Varicose veins of lower extremities with inflammation     Past Surgical History:  Procedure Laterality Date  . ABDOMINAL HYSTERECTOMY    . APPENDECTOMY    . benign breast biopsy    . CHOLECYSTECTOMY    . LEFT HEART CATHETERIZATION WITH CORONARY ANGIOGRAM N/A 09/30/2013   Procedure: LEFT HEART CATHETERIZATION WITH CORONARY ANGIOGRAM;  Surgeon: Troy Sine, MD;  Location: [facility] C[facility] LAB;  Service: Cardiovascular;  Laterality: N/A;  . OTHER SURGICAL HISTORY     Nuclear Stress Test with EF 70%, Chest Radiograph-lungs are clear without pleural effusions or focal consolidations. Trachea  projects midline and there is no pneumothorax. Suture anchors in right humeral head.  Marland Kitchen SHOULDER SURGERY     Repair  . TONSILLECTOMY    . TOTAL KNEE ARTHROPLASTY Left 10/14/2014   Procedure: LEFT TOTAL KNEE ARTHROPLASTY;  Surgeon: Latanya Maudlin, MD;  Location: [facility] ORS;  Service: Orthopedics;  Laterality: Left;    Current Outpatient Medications  Medication Sig Dispense Refill  . albuterol (PROVENTIL HFA;VENTOLIN HFA) 108 (90 BASE) MCG/ACT inhaler Inhale 2 puffs into the lungs every 6 (six) hours as needed for wheezing or shortness of breath.    Marland Kitchen amLODipine (NORVASC) 2.5 MG tablet TAKE ONE TABLET BY MOUTH ONCE DAILY 30 tablet 5  . apixaban (ELIQUIS) 2.5 MG TABS tablet Take 2.5 mg by  mouth 2 (two) times daily.    Marland Kitchen BIOTIN PO Take 1,000 mg by mouth daily.     . Cholecalciferol (VITAMIN D PO) Take 1,000 Units by mouth daily.    . citalopram (CELEXA) 10 MG tablet Take 1 tablet by mouth daily.  3  . cyanocobalamin 500 MCG tablet Take 500 mcg by mouth daily.    Marland Kitchen donepezil (ARICEPT) 5 MG tablet Take 1 tablet by mouth daily.  3  . furosemide (LASIX) 20 MG tablet TAKE ONE TABLET BY MOUTH ONCE DAILY AS NEEDED FOR  EDEMA 30 tablet 10  . labetalol (NORMODYNE) 200 MG tablet Take 200 mg by mouth 2 (two) times daily.    Marland Kitchen losartan (COZAAR) 50 MG tablet Take 1 tablet by mouth daily.  3  . Magnesium 250 MG TABS Take 2 tablets by mouth every morning.     . metoprolol tartrate (LOPRESSOR) 25 MG tablet Take 1 tablet (25 mg total) by mouth 2 (two) times daily. Please make yearly appt with Dr.Klein for September for future refills.1st attempt 180 tablet 1  . pramipexole (MIRAPEX) 0.5 MG tablet Take 1.5 tablets by mouth at bedtime.  1   No current facility-administered medications for this visit.     Allergies  Allergen Reactions  . Daypro [Oxaprozin] Other (See Comments)    Palpitation  . Labetalol Swelling  . Statins Other (See Comments)    Myalgias  Patient denies this allergy at preop visit 10-08-14  . Zetia [Ezetimibe] Other (See Comments)    Joint Pain, Swelling    Review of Systems negative except from HPI and PMH  Physical Exam BP 130/74   Pulse (!) 56   Ht 5\' 2"  (1.575 m)   Wt 174 lb 6.4 oz (79.1 kg)   SpO2 99%   BMI 31.90 kg/m  Well developed and nourished in no acute distress HENT normal Neck supple with JVP-flat Clear Regular rate and rhythm, no murmurs or gallops Abd-soft with active BS No Clubbing cyanosis left 2+ edema Skin-warm and dry A & Oriented  Grossly normal sensory and motor function tremors of her right leg   ECG sinus rhythm at 56 Intervals 18/09/43   Assessment and  Plan  Atrial fibrillation-paroxysmal   Sinus bradycardia  Hypertension-    Diabetes     Blood pressures well controlled  Her apixaban dose seems to be too low.  We will recheck her metabolic profile today for creatinine.  I told her we may need to increase it to 5.  I am not sure the mechanism of her jerking and shaking.  We will refer her to neurology.  Bradycardia seems to be asymptomatic.  We spent more than 50% of our >25 min visit in face to face counseling regarding  the above

## 2018-01-29 DIAGNOSIS — R251 Tremor, unspecified: Secondary | ICD-10-CM | POA: Diagnosis not present

## 2018-01-29 DIAGNOSIS — R0602 Shortness of breath: Secondary | ICD-10-CM | POA: Diagnosis not present

## 2018-01-30 ENCOUNTER — Encounter: Payer: Self-pay | Admitting: Neurology

## 2018-02-15 ENCOUNTER — Other Ambulatory Visit: Payer: Self-pay | Admitting: Internal Medicine

## 2018-03-07 ENCOUNTER — Other Ambulatory Visit: Payer: Self-pay | Admitting: *Deleted

## 2018-03-07 MED ORDER — APIXABAN 5 MG PO TABS: 5.0000 mg | ORAL_TABLET | Freq: Two times a day (BID) | ORAL | 10 refills | Status: DC

## 2018-03-07 NOTE — Telephone Encounter (Signed)
Pt is a 84 yr old female who last saw Dr Caryl Comes on 01/28/18. Weight at that visit was 79.1Kg. SCr on 01/28/18 was 0.86. Thus, Eliquis dose should be 5mg  BID. Telephoned and she states she is currently taking 5mg  BID. Thus, will refill Eliquis 5mg  BID.

## 2018-03-07 NOTE — Telephone Encounter (Signed)
Pt is a 84 yr old female who last saw Dr Caryl Comes on 01/28/18, wt at that visit was 79.1Kg. SCr on 01/28/18 was 0.86. Will refill Eliquis 5mg  BID.

## 2018-04-05 NOTE — Progress Notes (Signed)
[facility] Clinic Note - Initial Visit   Date: 04/08/18  Kelly Boone MRN: [id number] DOB: 1931-08-28   Dear Dr. Caryl Comes:  Thank you for your kind referral of Kelly Boone for consultation of RLS. Although her history is well known to you, please allow [place] to reiterate it for the purpose of our medical record. The patient was accompanied to the clinic by daughter who also provides collateral information.     History of Present Illness: Kelly Boone is a 85 y.o. right-handed Caucasian female with atrial fibrillation, hypertension, CKD, depression, anxiety, diabetes mellitus, GERD, hyperlipidemia presenting for evaluation of restless leg syndrome and abnormal movements.    She had had RLS for many years and pramipexole 0.5mg  for many years, however during the fall of 2019, she began having much more intense discomfort.  She used to have to walk at night to get relief.  Her pramipexole was increased to 1mg  at bedtime which has markedly helped her RLS.  She does not complain of daytime symptoms.  She also complains for muscle cramps involving the feet, lower legs, and occasionally the abdomen.  Cramps have been long standing and she usually uses mustard which helps.  She drinks a lot of tea during the day and voids frequently.  She takes lasix infrequently for leg edema. She does not take statin medications.  She had had two spells of generalized shaking/trembling of the body, which lasts about 4-5 minutes.  There is no loss consciousness, weakness, numbness/tingling, or syncopal spells.  She had a video which shows high amplitude tremor of the trunk, which did not disrupt her ability to walk.   She had dementia and takes Aricept 5mg , but remains highly independent manages her own medications, finances, and drives.  She keeps a note book and writes down everything so she does not forget things, including the date each morning.  Daughter feels that if she did not make  notes of everything, she would be more functionally impaired.  No home safety or driving concerns.  She does not get lost and there are no behavior concerns.    Past Medical History:  Diagnosis Date  . A-fib (HCC)    irregular heartbeat  . Allergic rhinitis   . Anxiety   . Arthritis   . Asthma   . Atopic dermatitis   . Benign essential hypertension   . Chronic kidney disease    hx of kidney stones as teenager  . Depression   . Disorders of magnesium metabolism   . DM (diabetes mellitus) ([place]) 2004  . Edema   . Encounter for long-term (current) use of other medications   . Esophagitis   . GERD (gastroesophageal reflux disease)   . Headache   . Hearing loss   . Hypercalcemia   . Hyperlipemia 1997  . Hypertension 1997  . Hyposmolality and/or hyponatremia   . Myalgia and myositis, unspecified   . Myocardial infarction [facility])    June 2015, was told caused by stress  . Need for prophylactic vaccination and inoculation against influenza   . Osteoporosis, unspecified   . Other screening mammogram   . Other syndromes affecting cervical region   . Pain in joint, lower leg   . Pain in joint, shoulder region   . Peripheral autonomic neuropathy in disorders classified elsewhere(337.1)   . Pneumonia    as infant  . Restless legs syndrome (RLS)   . Screening for depression   . Sinus bradycardia   .  Special screening for malignant neoplasms, colon   . Type 2 diabetes mellitus with autonomic neuropathy ([place])   . Unspecified essential hypertension   . Unspecified vitamin D deficiency   . Urinary tract infection, site not specified   . Varicose veins of lower extremities with inflammation     Past Surgical History:  Procedure Laterality Date  . ABDOMINAL HYSTERECTOMY    . APPENDECTOMY    . benign breast biopsy    . CHOLECYSTECTOMY    . LEFT HEART CATHETERIZATION WITH CORONARY ANGIOGRAM N/A 09/30/2013   Procedure: LEFT HEART CATHETERIZATION WITH CORONARY ANGIOGRAM;  Surgeon:  Troy Sine, MD;  Location: [facility] CATH LAB;  Service: Cardiovascular;  Laterality: N/A;  . OTHER SURGICAL HISTORY     Nuclear Stress Test with EF 70%, Chest Radiograph-lungs are clear without pleural effusions or focal consolidations. Trachea projects midline and there is no pneumothorax. Suture anchors in right humeral head.  Marland Kitchen SHOULDER SURGERY     Repair  . TONSILLECTOMY    . TOTAL KNEE ARTHROPLASTY Left 10/14/2014   Procedure: LEFT TOTAL KNEE ARTHROPLASTY;  Surgeon: Latanya Maudlin, MD;  Location: [facility];  Service: Orthopedics;  Laterality: Left;     Medications:  Outpatient Encounter Medications as of 04/08/2018  Medication Sig  . albuterol (PROVENTIL HFA;VENTOLIN HFA) 108 (90 BASE) MCG/ACT inhaler Inhale 2 puffs into the lungs every 6 (six) hours as needed for wheezing or shortness of breath.  Marland Kitchen amLODipine (NORVASC) 2.5 MG tablet TAKE ONE TABLET BY MOUTH ONCE DAILY  . apixaban (ELIQUIS) 5 MG TABS tablet Take 1 tablet (5 mg total) by mouth 2 (two) times daily.  Marland Kitchen BIOTIN PO Take 5,000 mg by mouth daily.   . Cholecalciferol (VITAMIN D PO) Take 1,000 Units by mouth daily.  . citalopram (CELEXA) 10 MG tablet Take 1 tablet by mouth daily.  . cyanocobalamin 500 MCG tablet Take 1,000 mcg by mouth daily.   Marland Kitchen donepezil (ARICEPT) 5 MG tablet Take 1 tablet by mouth daily.  . furosemide (LASIX) 20 MG tablet TAKE ONE TABLET BY MOUTH ONCE DAILY AS NEEDED FOR  EDEMA  . labetalol (NORMODYNE) 200 MG tablet Take 200 mg by mouth 2 (two) times daily.  Marland Kitchen losartan (COZAAR) 50 MG tablet Take 1 tablet by mouth daily.  . Magnesium 250 MG TABS Take 2 tablets by mouth every morning.   . metoprolol tartrate (LOPRESSOR) 25 MG tablet TAKE 1 TABLET BY MOUTH TWICE DAILY  . pramipexole (MIRAPEX) 1 MG tablet Take 1 tablet (1 mg total) by mouth at bedtime.  . [DISCONTINUED] pramipexole (MIRAPEX) 0.5 MG tablet Take 1.5 tablets by mouth at bedtime.   No facility-administered encounter medications on file as of 04/08/2018.        Allergies:  Allergies  Allergen Reactions  . Daypro [Oxaprozin] Other (See Comments)    Palpitation  . Labetalol Swelling  . Statins Other (See Comments)    Myalgias  Patient denies this allergy at preop visit 10-08-14  . Zetia [Ezetimibe] Other (See Comments)    Joint Pain, Swelling    Family History: Family History  Problem Relation Age of Onset  . CAD Brother   . Anemia Mother   . CAD Mother   . Hypertension Mother     Social History:  Daughter lives with her.  She used to work at [employer] at [employer]	[place].  Social History   Tobacco Use  . Smoking status: Never Smoker  . Smokeless tobacco: Never Used  Substance Use Topics  .  Alcohol use: No  . Drug use: No   Social History   Social History Narrative   Lives with daughter in a one story home.  Has 2 children.  Retired from [employer].  Education: high school.      Review of Systems:  CONSTITUTIONAL: No fevers, chills, night sweats, or weight loss.   EYES: No visual changes or eye pain ENT: No hearing changes.  No history of nose bleeds.   RESPIRATORY: No cough, wheezing and shortness of breath.   CARDIOVASCULAR: Negative for chest pain, and palpitations.   GI: Negative for abdominal discomfort, blood in stools or black stools.  No recent change in bowel habits.   GU:  No history of incontinence.   MUSCLOSKELETAL: No history of joint pain or swelling.  No myalgias.   SKIN: Negative for lesions, rash, and itching.   HEMATOLOGY/ONCOLOGY: Negative for prolonged bleeding, bruising easily, and swollen nodes.  No history of cancer.   ENDOCRINE: Negative for cold or heat intolerance, polydipsia or goiter.   PSYCH:  No depression or anxiety symptoms.   NEURO: As Above.   Vital Signs:  BP 110/70   Pulse (!) 55   Ht 5' 1.5" (1.562 m)   Wt 170 lb 2 oz (77.2 kg)   SpO2 96%   BMI 31.62 kg/m    General Medical Exam:   General:  Well appearing, comfortable.   Eyes/ENT: see cranial nerve  examination.   Neck:   No carotid bruits. Respiratory:  Clear to auscultation, good air entry bilaterally.   Cardiac:  Regular rate and rhythm, no murmur.   Extremities:  No deformities, edema, or skin discoloration.  Skin:  No rashes or lesions.  Neurological Exam: MENTAL STATUS including orientation to time, place, person, recent and remote memory, attention span and concentration, language, and fund of knowledge is normal.  Speech is not dysarthric. Montreal Cognitive Assessment  04/08/2018  Visuospatial/ Executive (0/5) 3  Naming (0/3) 2  Attention: Read list of digits (0/2) 2  Attention: Read list of letters (0/1) 0  Attention: Serial 7 subtraction starting at 100 (0/3) 2  Language: Repeat phrase (0/2) 1  Language : Fluency (0/1) 0  Abstraction (0/2) 0  Delayed Recall (0/5) 1  Orientation (0/6) 6  Total 17  Adjusted Score (based on education) 18    CRANIAL NERVES: II:  No visual field defects.   III-IV-VI: Pupils equal round and reactive to light.  Normal conjugate, extra-ocular eye movements in all directions of gaze.  No nystagmus.  No ptosis.   V:  Normal facial sensation.    VII:  Normal facial symmetry and movements.  VIII:  Normal hearing and vestibular function.   IX-X:  Normal palatal movement.   XI:  Normal shoulder shrug and head rotation.   XII:  Normal tongue strength and range of motion, no deviation or fasciculation.  MOTOR:  No atrophy, fasciculations or abnormal movements.  No pronator drift.  Tone is normal.    Right Upper Extremity:    Left Upper Extremity:    Deltoid  5/5   Deltoid  5/5   Biceps  5/5   Biceps  5/5   Triceps  5/5   Triceps  5/5   Wrist extensors  5/5   Wrist extensors  5/5   Wrist flexors  5/5   Wrist flexors  5/5   Finger extensors  5/5   Finger extensors  5/5   Finger flexors  5/5   Finger flexors  5/5   Dorsal interossei  5/5   Dorsal interossei  5/5   Abductor pollicis  5/5   Abductor pollicis  5/5   Tone (Ashworth scale)  0   Tone (Ashworth scale)  0   Right Lower Extremity:    Left Lower Extremity:    Hip flexors  5/5   Hip flexors  5/5   Hip extensors  5/5   Hip extensors  5/5   Knee flexors  5/5   Knee flexors  5/5   Knee extensors  5/5   Knee extensors  5/5   Dorsiflexors  5/5   Dorsiflexors  5/5   Plantarflexors  5/5   Plantarflexors  5/5   Toe extensors  5/5   Toe extensors  5/5   Toe flexors  5/5   Toe flexors  5/5   Tone (Ashworth scale)  0  Tone (Ashworth scale)  0   MSRs:  Right                                                                 Left brachioradialis 2+  brachioradialis 2+  biceps 2+  biceps 2+  triceps 2+  triceps 2+  patellar 2+  patellar 2+  ankle jerk 1+  ankle jerk 1+  Hoffman no  Hoffman no  plantar response down  plantar response down   SENSORY:  Vibration is reduced at the great toe bilaterally, temperature and pin prick is intact.    COORDINATION/GAIT: Normal finger-to- nose-finger.  Intact rapid alternating movements bilaterally.  Able to rise from a chair without using arms.  Gait narrow based and stable, unassisted.  IMPRESSION: 1.  Restless leg syndrome, well-controlled  - Continue pramipexole 1mg  at bedtime   - Check ferritin level 2.  Muscle cramps  - Check TSH, vitamin B12  - Encouraged to stay well-hydrated with water and try to drink less tea   - Start leg stretches before bedtime 3.  Episode of involuntary truncal tremor, nonspecific.  Fortunately she has not had any further spells.    - Continue to monitor 4.  Moderate Alzheimer's dementia, which she disguises very well because she continues to manage most of her daily affairs.  Daughter is POA and feels safe with her driving locally.  No home safety issues.  I advised her to continue cognitive stimulating activities, such as puzzles, but also try to stay physically active  Return to clinic in 4 months  Greater than 50% of this 50 minute visit was spent in counseling, explanation of diagnosis, planning of  further management, and coordination of caref.   Thank you for allowing me to participate in patient's care.  If I can answer any additional questions, I would be pleased to do so.    Sincerely,    Donika K. Posey Pronto, DO

## 2018-04-08 ENCOUNTER — Encounter: Payer: Self-pay | Admitting: Neurology

## 2018-04-08 ENCOUNTER — Ambulatory Visit (INDEPENDENT_AMBULATORY_CARE_PROVIDER_SITE_OTHER): Payer: Medicare Other | Admitting: Neurology

## 2018-04-08 ENCOUNTER — Other Ambulatory Visit (INDEPENDENT_AMBULATORY_CARE_PROVIDER_SITE_OTHER): Payer: Medicare Other

## 2018-04-08 VITALS — BP 110/70 | HR 55 | Ht 61.5 in | Wt 170.1 lb

## 2018-04-08 DIAGNOSIS — G309 Alzheimer's disease, unspecified: Secondary | ICD-10-CM | POA: Diagnosis not present

## 2018-04-08 DIAGNOSIS — R6889 Other general symptoms and signs: Secondary | ICD-10-CM

## 2018-04-08 DIAGNOSIS — G2581 Restless legs syndrome: Secondary | ICD-10-CM

## 2018-04-08 DIAGNOSIS — F0280 Dementia in other diseases classified elsewhere without behavioral disturbance: Secondary | ICD-10-CM | POA: Diagnosis not present

## 2018-04-08 DIAGNOSIS — R259 Unspecified abnormal involuntary movements: Secondary | ICD-10-CM

## 2018-04-08 DIAGNOSIS — R252 Cramp and spasm: Secondary | ICD-10-CM

## 2018-04-08 LAB — TSH: TSH: 4.26 u[IU]/mL (ref 0.35–4.50)

## 2018-04-08 LAB — VITAMIN B12: Vitamin B-12: 920 pg/mL — ABNORMAL HIGH (ref 211–911)

## 2018-04-08 LAB — FERRITIN: Ferritin: 27.7 ng/mL (ref 10.0–291.0)

## 2018-04-08 MED ORDER — PRAMIPEXOLE DIHYDROCHLORIDE 1 MG PO TABS: 1.0000 mg | ORAL_TABLET | Freq: Every day | ORAL | 3 refills | Status: DC

## 2018-04-08 NOTE — Patient Instructions (Addendum)
Continue pramipexole 1mg  at bedtime  Start to drink more water and less tea  You can drink 2-3 glasses of tonic water for cramps  Check labs  Return to clinic 4 months

## 2018-04-15 DIAGNOSIS — J45901 Unspecified asthma with (acute) exacerbation: Secondary | ICD-10-CM | POA: Diagnosis not present

## 2018-04-18 ENCOUNTER — Telehealth: Payer: Self-pay | Admitting: Internal Medicine

## 2018-04-18 NOTE — Telephone Encounter (Signed)
Called patient's daughter (DPR) about her message. Patient started complaining of being in A. FIB around noon, so she took propranolol 10 mg that Dr. Caryl Comes had given her for A. FIB episodes. Patient's daughter stated that she has taken this once before and her symptoms resolved within about an hour. This time it took about 3 hours after taking propranolol. Patient stated she feels fine now. Informed patient's daughter that a message would be sent to Dr. Caryl Comes, so he is aware. Informed patient's daughter that propranolol is working and next time give it more time to work, and if it is not working give our office a call.

## 2018-04-18 NOTE — Telephone Encounter (Signed)
New message   Patient c/o Palpitations:  High priority if patient c/o lightheadedness, shortness of breath, or chest pain  1) How long have you had palpitations/irregular HR/ Afib? Are you having the symptoms now? Since 12:30 04/18/18  2) Are you currently experiencing lightheadedness, SOB or CP? Slight SOB  3) Do you have a history of afib (atrial fibrillation) or irregular heart rhythm? Yes   4) Have you checked your BP or HR? (document readings if available): Her BP 177/76 HR- 74  5) Are you experiencing any other symptoms? No

## 2018-04-24 NOTE — Telephone Encounter (Signed)
Noted. Thanks.

## 2018-04-30 DIAGNOSIS — J029 Acute pharyngitis, unspecified: Secondary | ICD-10-CM | POA: Diagnosis not present

## 2018-05-05 DIAGNOSIS — I10 Essential (primary) hypertension: Secondary | ICD-10-CM | POA: Diagnosis not present

## 2018-05-05 DIAGNOSIS — E7800 Pure hypercholesterolemia, unspecified: Secondary | ICD-10-CM | POA: Diagnosis not present

## 2018-05-05 DIAGNOSIS — I4891 Unspecified atrial fibrillation: Secondary | ICD-10-CM | POA: Diagnosis not present

## 2018-05-05 DIAGNOSIS — I252 Old myocardial infarction: Secondary | ICD-10-CM | POA: Diagnosis not present

## 2018-05-05 DIAGNOSIS — I160 Hypertensive urgency: Secondary | ICD-10-CM | POA: Diagnosis not present

## 2018-05-06 DIAGNOSIS — I10 Essential (primary) hypertension: Secondary | ICD-10-CM | POA: Diagnosis not present

## 2018-05-08 DIAGNOSIS — I10 Essential (primary) hypertension: Secondary | ICD-10-CM | POA: Diagnosis not present

## 2018-05-08 DIAGNOSIS — Z6831 Body mass index (BMI) 31.0-31.9, adult: Secondary | ICD-10-CM | POA: Diagnosis not present

## 2018-05-08 DIAGNOSIS — G2581 Restless legs syndrome: Secondary | ICD-10-CM | POA: Diagnosis not present

## 2018-06-05 DIAGNOSIS — L209 Atopic dermatitis, unspecified: Secondary | ICD-10-CM | POA: Diagnosis not present

## 2018-06-05 DIAGNOSIS — C4441 Basal cell carcinoma of skin of scalp and neck: Secondary | ICD-10-CM | POA: Diagnosis not present

## 2018-06-05 DIAGNOSIS — C44319 Basal cell carcinoma of skin of other parts of face: Secondary | ICD-10-CM | POA: Diagnosis not present

## 2018-06-12 DIAGNOSIS — C44319 Basal cell carcinoma of skin of other parts of face: Secondary | ICD-10-CM | POA: Diagnosis not present

## 2018-07-04 ENCOUNTER — Other Ambulatory Visit: Payer: Self-pay

## 2018-07-04 ENCOUNTER — Telehealth (INDEPENDENT_AMBULATORY_CARE_PROVIDER_SITE_OTHER): Payer: Medicare Other | Admitting: Neurology

## 2018-07-04 DIAGNOSIS — R259 Unspecified abnormal involuntary movements: Secondary | ICD-10-CM

## 2018-07-04 DIAGNOSIS — G2581 Restless legs syndrome: Secondary | ICD-10-CM

## 2018-07-04 DIAGNOSIS — G309 Alzheimer's disease, unspecified: Secondary | ICD-10-CM

## 2018-07-04 DIAGNOSIS — F0280 Dementia in other diseases classified elsewhere without behavioral disturbance: Secondary | ICD-10-CM | POA: Diagnosis not present

## 2018-07-04 NOTE — Progress Notes (Signed)
   Virtual Visit via Video Note The purpose of this virtual visit is to provide medical care while limiting exposure to the novel coronavirus.    Consent was obtained for video visit:  Yes.   Answered questions that patient had about telehealth interaction:  Yes.   I discussed the limitations, risks, security and privacy concerns of performing an evaluation and management service by telemedicine. I also discussed with the patient that there may be a patient responsible charge related to this service. The patient expressed understanding and agreed to proceed.  Pt location: Home Physician Location: office Name of referring provider:  Ronita Hipps, MD I connected with Kelly Boone at patients initiation/request on 07/04/2018 at 11:00 AM EDT by video enabled telemedicine application and verified that I am speaking with the correct person using two identifiers. Pt MRN:  [id number] Pt DOB:  [birth year] Video Participants:  Kelly Boone;  Drue Stager (daughter)   History of Present Illness: This is a 85 year-old female returning for evaluation of restless leg syndrome, spells of tremor, and Alzhemer's dementia.  Her restless leg syndrome is doing well on pramipexole 1 mg at bedtime.  She does not wake up at nighttime due to discomfort.  Overall, her daughter feels that her memory is stable.  She does tend to repeat herself often, which is not new.  She manages her own household affairs such as paying bills and still drives locally.  Daughter does not have any home safety concerns.  Daughter has started to manage her medications in a pillbox.  Mood and sleep is good.  Since her last visit, she had one episode of generalized tremors, which woke her up from sleeping.  This lasted about 45 minutes and then self resolved.  She has not had another spell like this again.  There was no loss of consciousness, weakness, or falls.  She has been hospitalized for hypertension.    Observations/Objective:    She is awake, alert, and oriented to person, place, and year.  She appears comfortable and easily engages in conversation. Face is symmetric, no ptosis.  Tongue is midline. She is antigravity in all extremities.  There is no pronator drift.  No abnormal movements on exam. Finger tapping is normal.  No dysmetria to finger to nose testing. She is able to stand up with arms crossed.  Gait appears stable, unassisted.   Assessment and Plan:   1.  Restless leg syndrome, well-controlled  - Continue pramipexole 1mg  at bedtime  - Ferritin was low at 27.7 in January 2020, she is taking ferrous sulfate 325mg  daily  2.  Moderate Alzheimer's dementia, stable.  She needs assistance with medications, otherwise is able to manage her own finances, meals, and continues to drive locally.  There are no home safety issues.  3.  Spells of truncal tremor.  This is very sporadic and may suggest orthostatic tremor, if this becomes more frequent, consider treating symptomatically with low-dose benzodiazepine.  Follow Up Instructions:   I discussed the assessment and treatment plan with the patient. The patient was provided an opportunity to ask questions and all were answered. The patient agreed with the plan and demonstrated an understanding of the instructions.   The patient was advised to call back or seek an in-person evaluation if the symptoms worsen or if the condition fails to improve as anticipated.  Return to clinic in 6 months, or sooner as needed.    Alda Berthold, DO

## 2018-08-07 ENCOUNTER — Ambulatory Visit: Payer: Medicare Other | Admitting: Neurology

## 2018-08-13 ENCOUNTER — Other Ambulatory Visit: Payer: Self-pay | Admitting: Internal Medicine

## 2018-08-23 DIAGNOSIS — L259 Unspecified contact dermatitis, unspecified cause: Secondary | ICD-10-CM | POA: Diagnosis not present

## 2018-08-27 DIAGNOSIS — L255 Unspecified contact dermatitis due to plants, except food: Secondary | ICD-10-CM | POA: Diagnosis not present

## 2018-09-02 DIAGNOSIS — L239 Allergic contact dermatitis, unspecified cause: Secondary | ICD-10-CM | POA: Diagnosis not present

## 2018-09-02 DIAGNOSIS — L299 Pruritus, unspecified: Secondary | ICD-10-CM | POA: Diagnosis not present

## 2018-10-29 DIAGNOSIS — E785 Hyperlipidemia, unspecified: Secondary | ICD-10-CM | POA: Diagnosis not present

## 2018-10-29 DIAGNOSIS — E1169 Type 2 diabetes mellitus with other specified complication: Secondary | ICD-10-CM | POA: Diagnosis not present

## 2018-10-29 DIAGNOSIS — Z79899 Other long term (current) drug therapy: Secondary | ICD-10-CM | POA: Diagnosis not present

## 2018-10-29 DIAGNOSIS — Z0000 Encounter for general adult medical examination without abnormal findings: Secondary | ICD-10-CM | POA: Diagnosis not present

## 2018-10-29 DIAGNOSIS — Z6828 Body mass index (BMI) 28.0-28.9, adult: Secondary | ICD-10-CM | POA: Diagnosis not present

## 2018-10-29 DIAGNOSIS — E7800 Pure hypercholesterolemia, unspecified: Secondary | ICD-10-CM | POA: Diagnosis not present

## 2018-11-09 ENCOUNTER — Other Ambulatory Visit: Payer: Self-pay | Admitting: Internal Medicine

## 2018-11-20 DIAGNOSIS — Z23 Encounter for immunization: Secondary | ICD-10-CM | POA: Diagnosis not present

## 2019-02-07 ENCOUNTER — Ambulatory Visit: Payer: Medicare Other | Admitting: Neurology

## 2019-02-12 ENCOUNTER — Telehealth: Payer: Self-pay | Admitting: Internal Medicine

## 2019-02-12 NOTE — Telephone Encounter (Signed)
Pts daughter, [name] would like to have the Propanolol refilled but it has been D/C. Pt has not been seen by Dr. Caryl Comes in over a year but has an appt on Dec 1st. Pts daughter says that the Propanolol helps with her mothers episodes of AFib.

## 2019-02-12 NOTE — Telephone Encounter (Signed)
Called pt's daughter back to inform her that pt no longer takes propranolol, it was D/C by provider. Pt's daughter [name] stated that she think pt went into A-fib last night and pt did not have any medication to take. Pt daughter would like a call back concerning this matter. Please address

## 2019-02-12 NOTE — Telephone Encounter (Signed)
°*  STAT* If patient is at the pharmacy, call can be transferred to refill team.   1. Which medications need to be refilled? (please list name of each medication and dose if known) propranolol (INDERAL) 10 MG tablet   2. Which pharmacy/location (including street and city if local pharmacy) is medication to be sent to? [place], [place]  3. Do they need a 30 day or 90 day supply? 30 day

## 2019-02-14 NOTE — Telephone Encounter (Signed)
Can u clarify what BB she is on, the last MAR from 2019 shows labetolol AND metoprolol Thanks SK

## 2019-02-17 ENCOUNTER — Other Ambulatory Visit: Payer: Self-pay

## 2019-02-17 ENCOUNTER — Encounter: Payer: Self-pay | Admitting: Neurology

## 2019-02-17 ENCOUNTER — Ambulatory Visit (INDEPENDENT_AMBULATORY_CARE_PROVIDER_SITE_OTHER): Payer: Medicare Other | Admitting: Neurology

## 2019-02-17 VITALS — BP 123/72 | HR 65 | Ht 61.5 in | Wt 163.0 lb

## 2019-02-17 DIAGNOSIS — G2581 Restless legs syndrome: Secondary | ICD-10-CM | POA: Diagnosis not present

## 2019-02-17 DIAGNOSIS — F0280 Dementia in other diseases classified elsewhere without behavioral disturbance: Secondary | ICD-10-CM

## 2019-02-17 DIAGNOSIS — G252 Other specified forms of tremor: Secondary | ICD-10-CM

## 2019-02-17 DIAGNOSIS — G309 Alzheimer's disease, unspecified: Secondary | ICD-10-CM | POA: Diagnosis not present

## 2019-02-17 MED ORDER — DONEPEZIL HCL 10 MG PO TABS: 10.0000 mg | ORAL_TABLET | Freq: Every day | ORAL | 3 refills | Status: DC

## 2019-02-17 MED ORDER — PRAMIPEXOLE DIHYDROCHLORIDE 1 MG PO TABS: 1.0000 mg | ORAL_TABLET | Freq: Every day | ORAL | 3 refills | Status: DC

## 2019-02-17 NOTE — Progress Notes (Signed)
Follow-up Visit   Date: 02/17/19   Kelly Boone MRN: [id number] DOB: 1931-07-09   Interim History: Kelly Boone is a 85 y.o. right-handed Caucasian female with atrial fibrillation, hypertension, CKD, depression, anxiety, diabetes mellitus, GERD, and hyperlipidemia returning to the clinic for follow-up of restless leg syndrome and Alzheimer's dementia.  The patient was accompanied to the clinic by daughter who also provides collateral information.    History of present illness: She had had RLS for many years and pramipexole 0.5mg  for many years, however during the fall of 2019, she began having much more intense discomfort.  She used to have to walk at night to get relief.  Her pramipexole was increased to 1mg  at bedtime which has markedly helped her RLS.  She does not complain of daytime symptoms.  She had had two spells of generalized shaking/trembling of the body, which lasts about 4-5 minutes.  There is no loss consciousness, weakness, numbness/tingling, or syncopal spells.  She had a video which shows high amplitude tremor of the trunk, which did not disrupt her ability to walk.   She had dementia and takes Aricept 5mg , but remains highly independent manages her own medications, finances, and drives.  She keeps a note book and writes down everything so she does not forget things, including the date each morning.  Daughter feels that if she did not make notes of everything, she would be more functionally impaired.  No home safety or driving concerns.  She does not get lost and there are no behavior concerns.  UPDATE 02/17/2019:  She is here for follow-up visit with her daughter.  Over the past few months, there has been no marked change in memory that daughter has observed.  She continues to note that patient repeats herself often and sometimes had to be reminded of things.  Daughter helps with meals and preparing medications, however the patient is able to repeat food in the  microwave and remember to take her medications.  She makes a daily list of things she needs to do and feels that this helps keep her on track.  She continues to drive locally and there are no safety concerns.  She manages her own finances.  Restless leg remains well controlled on pramipexole 1 mg at bedtime.  She had one episode of generalized trembling which lasted longer about 10 to 20 minutes and woke her up from sleeping.  There was no loss of consciousness, weakness, or confusion.  The spells do not occur frequently, this was the first time this year.  Medications:  Current Outpatient Medications on File Prior to Visit  Medication Sig Dispense Refill  . albuterol (PROVENTIL HFA;VENTOLIN HFA) 108 (90 BASE) MCG/ACT inhaler Inhale 2 puffs into the lungs every 6 (six) hours as needed for wheezing or shortness of breath.    Marland Kitchen amLODipine (NORVASC) 2.5 MG tablet TAKE ONE TABLET BY MOUTH ONCE DAILY 30 tablet 5  . apixaban (ELIQUIS) 5 MG TABS tablet Take 1 tablet (5 mg total) by mouth 2 (two) times daily. 60 tablet 10  . BIOTIN PO Take 5,000 mg by mouth daily.     . Cholecalciferol (VITAMIN D PO) Take 1,000 Units by mouth daily.    . citalopram (CELEXA) 10 MG tablet Take 1 tablet by mouth daily.  3  . cyanocobalamin 500 MCG tablet Take 1,000 mcg by mouth daily.     Marland Kitchen donepezil (ARICEPT) 5 MG tablet Take 1 tablet by mouth daily.  3  . furosemide (  LASIX) 20 MG tablet TAKE ONE TABLET BY MOUTH ONCE DAILY AS NEEDED FOR  EDEMA 30 tablet 10  . labetalol (NORMODYNE) 200 MG tablet Take 1 tablet by mouth twice daily 180 tablet 1  . losartan (COZAAR) 50 MG tablet Take 1 tablet by mouth daily.  3  . Magnesium 250 MG TABS Take 2 tablets by mouth every morning.     . metoprolol tartrate (LOPRESSOR) 25 MG tablet TAKE 1 TABLET BY MOUTH TWICE DAILY 180 tablet 3   No current facility-administered medications on file prior to visit.     Allergies:  Allergies  Allergen Reactions  . Daypro [Oxaprozin] Other (See  Comments)    Palpitation  . Labetalol Swelling  . Statins Other (See Comments)    Myalgias  Patient denies this allergy at preop visit 10-08-14  . Zetia [Ezetimibe] Other (See Comments)    Joint Pain, Swelling    Review of Systems:  CONSTITUTIONAL: No fevers, chills, night sweats, or weight loss.  EYES: No visual changes or eye pain ENT: No hearing changes.  No history of nose bleeds.   RESPIRATORY: No cough, wheezing and shortness of breath.   CARDIOVASCULAR: Negative for chest pain, and palpitations.   GI: Negative for abdominal discomfort, blood in stools or black stools.  No recent change in bowel habits.   GU:  No history of incontinence.   MUSCLOSKELETAL: +history of joint pain or swelling.  No myalgias.   SKIN: Negative for lesions, rash, and itching.   ENDOCRINE: Negative for cold or heat intolerance, polydipsia or goiter.   PSYCH:  No depression or anxiety symptoms.   NEURO: As Above.   Vital Signs:  BP 123/72   Pulse 65   Ht 5' 1.5" (1.562 m)   Wt 163 lb (73.9 kg)   BMI 30.30 kg/m   Neurological Exam: MENTAL STATUS including orientation to time, place, person, recent and remote memory, attention span and concentration, language, and fund of knowledge is normal.  Speech is not dysarthric.  CRANIAL NERVES:  No visual field defects.  Pupils equal round and reactive to light.  Normal conjugate, extra-ocular eye movements in all directions of gaze.  No ptosis.    MOTOR:  Motor strength is 5/5 in all extremities.  No atrophy, fasciculations or abnormal movements.  No pronator drift.  Tone is normal.    COORDINATION/GAIT:  Normal finger-to- nose-finger.  Intact rapid alternating movements bilaterally.  Gait narrow based and stable.   Data:  Lab Results  Component Value Date   TSH 4.26 04/08/2018   Lab Results  Component Value Date   VITAMINB12 920 (H) 04/08/2018     IMPRESSION/PLAN: 1.  Restless leg syndrome, well controlled on pramipexole 1 mg at bedtime.   Refills provided.  2.  Motor Alzheimer's dementia, stable.  Increase Aricept to 10mg  daily. She needs assistance with medications and meals, and otherwise manages her own finances, drives locally, and helps with basic household chores as well as all ADLs.  Daughter denies any home safety issues including electric safe drive.  Precautions to restrict driving as well as overseeing of finances was discussed, should there be ongoing changes.   3.  Evidence of truncal tremor, ?  Orthostatic tremor.  Symptoms are very infrequent.  If symptoms become bothersome or more frequent, start low-dose benzodiazepine.  4.  Right knee pain, follow-up with PCP/orthopeadics.   Return to clinic in 1 year   Thank you for allowing me to participate in patient's care.  If I  can answer any additional questions, I would be pleased to do so.    Sincerely,    Zyonna Vardaman K. Posey Pronto, DO

## 2019-02-17 NOTE — Patient Instructions (Addendum)
Increase Aricept to 10mg  daily  Continue pramipexole 1mg  at bedtime  Try to stay active  Please follow-up with your orthopaedic doctor for right knee pain  If your shakes get worse, please call my office and we can decide about starting a medication  Return to clinic in 1 year

## 2019-02-19 NOTE — Telephone Encounter (Signed)
We can refill it  When u get the sig. For it  Could you let me know Thanks SK

## 2019-02-19 NOTE — Telephone Encounter (Signed)
Kelly Boone, patients daughter stated her mother has a bottle of Propanolol which is what she was hoping to get refilled. [name] says she can wait until seeing Dr. Caryl Comes in person in a couple weeks so that she can be evaluated.

## 2019-02-20 ENCOUNTER — Other Ambulatory Visit: Payer: Self-pay

## 2019-02-20 DIAGNOSIS — I480 Paroxysmal atrial fibrillation: Secondary | ICD-10-CM

## 2019-02-20 MED ORDER — PROPRANOLOL HCL 10 MG PO TABS: 10.0000 mg | ORAL_TABLET | Freq: Four times a day (QID) | ORAL | Status: AC | PRN

## 2019-02-20 NOTE — Progress Notes (Signed)
Spoke with [name], Pts daughter who prefers to have the Propranolol 10 mg tablet refilled in case her mother has an episode over the holiday weekend. She states her mother takes this medication when she has "episodes of Afib". Order sent to [facility] in [place]. Propranolol 10mg  tablet-Take one tablet every six hours PRN per the most recent order for this med.

## 2019-02-23 ENCOUNTER — Other Ambulatory Visit: Payer: Self-pay | Admitting: Internal Medicine

## 2019-02-24 NOTE — Telephone Encounter (Signed)
Age 85, weight 74kg, SCr 1.1 checked at [facility] on 10/29/18 Last OV October 2019, afib indication, has f/u scheduled in December

## 2019-03-04 ENCOUNTER — Ambulatory Visit: Payer: Medicare Other | Admitting: Physician Assistant

## 2019-03-09 ENCOUNTER — Other Ambulatory Visit: Payer: Self-pay | Admitting: Internal Medicine

## 2019-03-12 ENCOUNTER — Encounter (INDEPENDENT_AMBULATORY_CARE_PROVIDER_SITE_OTHER): Payer: Self-pay

## 2019-03-12 ENCOUNTER — Other Ambulatory Visit: Payer: Self-pay

## 2019-03-12 ENCOUNTER — Ambulatory Visit (INDEPENDENT_AMBULATORY_CARE_PROVIDER_SITE_OTHER): Payer: Medicare Other | Admitting: Student

## 2019-03-12 ENCOUNTER — Encounter: Payer: Self-pay | Admitting: Student

## 2019-03-12 VITALS — BP 146/70 | HR 51 | Ht 62.0 in | Wt 162.8 lb

## 2019-03-12 DIAGNOSIS — I10 Essential (primary) hypertension: Secondary | ICD-10-CM

## 2019-03-12 DIAGNOSIS — R001 Bradycardia, unspecified: Secondary | ICD-10-CM

## 2019-03-12 DIAGNOSIS — I4891 Unspecified atrial fibrillation: Secondary | ICD-10-CM

## 2019-03-12 LAB — CBC
Hematocrit: 34.1 % (ref 34.0–46.6)
Hemoglobin: 11.6 g/dL (ref 11.1–15.9)
MCH: 32 pg (ref 26.6–33.0)
MCHC: 34 g/dL (ref 31.5–35.7)
MCV: 94 fL (ref 79–97)
Platelets: 355 10*3/uL (ref 150–450)
RBC: 3.63 x10E6/uL — ABNORMAL LOW (ref 3.77–5.28)
RDW: 12.6 % (ref 11.7–15.4)
WBC: 7.4 10*3/uL (ref 3.4–10.8)

## 2019-03-12 LAB — BASIC METABOLIC PANEL
BUN/Creatinine Ratio: 19 (ref 12–28)
BUN: 16 mg/dL (ref 8–27)
CO2: 24 mmol/L (ref 20–29)
Calcium: 9.4 mg/dL (ref 8.7–10.3)
Chloride: 98 mmol/L (ref 96–106)
Creatinine, Ser: 0.85 mg/dL (ref 0.57–1.00)
GFR calc Af Amer: 72 mL/min/{1.73_m2} (ref >59)
GFR calc non Af Amer: 63 mL/min/{1.73_m2} (ref >59)
Glucose: 84 mg/dL (ref 65–99)
Potassium: 4.9 mmol/L (ref 3.5–5.2)
Sodium: 134 mmol/L (ref 134–144)

## 2019-03-12 MED ORDER — PROPRANOLOL HCL 10 MG PO TABS: 10.0000 mg | ORAL_TABLET | ORAL | 3 refills | Status: AC | PRN

## 2019-03-12 MED ORDER — METOPROLOL TARTRATE 25 MG PO TABS: 25.0000 mg | ORAL_TABLET | Freq: Two times a day (BID) | ORAL | 3 refills | Status: DC

## 2019-03-12 NOTE — Progress Notes (Signed)
PCP:  Ronita Hipps, MD Primary Cardiologist: No primary care provider on file. Electrophysiologist: Dr. Colin Ina is a 85 y.o. female with past medical history of paroxysmal atrial fibrillation on eliquis, sinus bradycardia, HTN, and DM2 who presents today for routine electrophysiology followup. They are seen for Dr. Caryl Comes.   Since last being seen in our clinic, the patient reports doing well overall.  She has an episode of palpitations several weeks ago, but this was in the setting of running out of her propanolol. Otherwise she has been doing great. She continues to have an intermittent tremor that she follows with neurology for. It does not limit her activity, so she is OK that no cause or relief has been found thus far. No falls.   The patient feels that she is tolerating medications without difficulties and is otherwise without complaint today.   Past Medical History:  Diagnosis Date  . A-fib (HCC)    irregular heartbeat  . Allergic rhinitis   . Anxiety   . Arthritis   . Asthma   . Atopic dermatitis   . Benign essential hypertension   . Chronic kidney disease    hx of kidney stones as teenager  . Depression   . Disorders of magnesium metabolism   . DM (diabetes mellitus) ([place]) 2004  . Edema   . Encounter for long-term (current) use of other medications   . Esophagitis   . GERD (gastroesophageal reflux disease)   . Headache   . Hearing loss   . Hypercalcemia   . Hyperlipemia 1997  . Hypertension 1997  . Hyposmolality and/or hyponatremia   . Myalgia and myositis, unspecified   . Myocardial infarction [facility])    June 2015, was told caused by stress  . Need for prophylactic vaccination and inoculation against influenza   . Osteoporosis, unspecified   . Other screening mammogram   . Other syndromes affecting cervical region   . Pain in joint, lower leg   . Pain in joint, shoulder region   . Peripheral autonomic neuropathy in disorders classified  elsewhere(337.1)   . Pneumonia    as infant  . Restless legs syndrome (RLS)   . Screening for depression   . Sinus bradycardia   . Special screening for malignant neoplasms, colon   . Type 2 diabetes mellitus with autonomic neuropathy ([place])   . Unspecified essential hypertension   . Unspecified vitamin D deficiency   . Urinary tract infection, site not specified   . Varicose veins of lower extremities with inflammation    Past Surgical History:  Procedure Laterality Date  . ABDOMINAL HYSTERECTOMY    . APPENDECTOMY    . benign breast biopsy    . CHOLECYSTECTOMY    . LEFT HEART CATHETERIZATION WITH CORONARY ANGIOGRAM N/A 09/30/2013   Procedure: LEFT HEART CATHETERIZATION WITH CORONARY ANGIOGRAM;  Surgeon: Troy Sine, MD;  Location: [facility] CATH LAB;  Service: Cardiovascular;  Laterality: N/A;  . OTHER SURGICAL HISTORY     Nuclear Stress Test with EF 70%, Chest Radiograph-lungs are clear without pleural effusions or focal consolidations. Trachea projects midline and there is no pneumothorax. Suture anchors in right humeral head.  Marland Kitchen SHOULDER SURGERY     Repair  . TONSILLECTOMY    . TOTAL KNEE ARTHROPLASTY Left 10/14/2014   Procedure: LEFT TOTAL KNEE ARTHROPLASTY;  Surgeon: Latanya Maudlin, MD;  Location: [facility] ORS;  Service: Orthopedics;  Laterality: Left;    Current Outpatient Medications  Medication Sig Dispense Refill  .  albuterol (PROVENTIL HFA;VENTOLIN HFA) 108 (90 BASE) MCG/ACT inhaler Inhale 2 puffs into the lungs every 6 (six) hours as needed for wheezing or shortness of breath.    Marland Kitchen amLODipine (NORVASC) 2.5 MG tablet TAKE ONE TABLET BY MOUTH ONCE DAILY 30 tablet 5  . BIOTIN PO Take 5,000 mg by mouth daily.     . Cholecalciferol (VITAMIN D PO) Take 1,000 Units by mouth daily.    . citalopram (CELEXA) 10 MG tablet Take 1 tablet by mouth daily.  3  . cloNIDine (CATAPRES) 0.2 MG tablet Take 0.2 mg by mouth as needed (For BP above 160).    . cyanocobalamin 500 MCG tablet Take 1,000 mcg  by mouth daily.     Marland Kitchen donepezil (ARICEPT) 10 MG tablet Take 1 tablet (10 mg total) by mouth daily. 90 tablet 3  . ELIQUIS 5 MG TABS tablet Take 1 tablet by mouth twice daily 60 tablet 5  . furosemide (LASIX) 20 MG tablet TAKE ONE TABLET BY MOUTH ONCE DAILY AS NEEDED FOR  EDEMA 30 tablet 10  . losartan (COZAAR) 50 MG tablet Take 1 tablet by mouth daily.  3  . Magnesium 250 MG TABS Take 2 tablets by mouth every morning.     . metoprolol tartrate (LOPRESSOR) 25 MG tablet Take 1 tablet by mouth twice daily 180 tablet 3  . pramipexole (MIRAPEX) 1 MG tablet Take 1 tablet (1 mg total) by mouth at bedtime. 90 tablet 3  . propranolol (INDERAL) 10 MG tablet Take 10 mg by mouth as needed.     Current Facility-Administered Medications  Medication Dose Route Frequency Provider Last Rate Last Dose  . propranolol (INDERAL) tablet 10 mg  10 mg Oral Q6H PRN Deboraha Sprang, MD        Allergies  Allergen Reactions  . Daypro [Oxaprozin] Other (See Comments)    Palpitation  . Labetalol Swelling  . Statins Other (See Comments)    Myalgias  Patient denies this allergy at preop visit 10-08-14  . Zetia [Ezetimibe] Other (See Comments)    Joint Pain, Swelling    Social History   Socioeconomic History  . Marital status: Married    Spouse name: Not on file  . Number of children: 2  . Years of education: 64  . Highest education level: High school graduate  Occupational History  . Occupation: retired    Fish farm manager: Engineer, [employer]  Social Needs  . Financial resource strain: Not on file  . Food insecurity    Worry: Not on file    Inability: Not on file  . Transportation needs    Medical: Not on file    Non-medical: Not on file  Tobacco Use  . Smoking status: Never Smoker  . Smokeless tobacco: Never Used  Substance and Sexual Activity  . Alcohol use: No  . Drug use: No  . Sexual activity: Not on file  Lifestyle  . Physical activity    Days per week: Not on file    Minutes per session: Not on  file  . Stress: Not on file  Relationships  . Social Herbalist on phone: Not on file    Gets together: Not on file    Attends religious service: Not on file    Active member of club or organization: Not on file    Attends meetings of clubs or organizations: Not on file    Relationship status: Not on file  . Intimate partner violence  Fear of current or ex partner: Not on file    Emotionally abused: Not on file    Physically abused: Not on file    Forced sexual activity: Not on file  Other Topics Concern  . Not on file  Social History Narrative   Lives with daughter in a one story home.  Has 2 children.  Retired from [employer].  Education: high school.       Review of Systems: General: No chills, fever, night sweats or weight changes  Cardiovascular:  No chest pain, dyspnea on exertion, edema, orthopnea, palpitations, paroxysmal nocturnal dyspnea Dermatological: No rash, lesions or masses Respiratory: No cough, dyspnea Urologic: No hematuria, dysuria Abdominal: No nausea, vomiting, diarrhea, bright red blood per rectum, melena, or hematemesis Neurologic: No visual changes, weakness, changes in mental status All other systems reviewed and are otherwise negative except as noted above.  Physical Exam: Vitals:   03/12/19 1149  BP: (!) 146/70  Pulse: (!) 51  SpO2: 96%  Weight: 162 lb 12.8 oz (73.8 kg)  Height: 5\' 2"  (1.575 m)    GEN- The patient is well appearing, alert and oriented x 3 today.   HEENT: normocephalic, atraumatic; sclera clear, conjunctiva pink; hearing intact; oropharynx clear; neck supple, no JVP Lymph- no cervical lymphadenopathy Lungs- Clear to ausculation bilaterally, normal work of breathing.  No wheezes, rales, rhonchi Heart- Regular rate and rhythm, no murmurs, rubs or gallops, PMI not laterally displaced GI- soft, non-tender, non-distended, bowel sounds present, no hepatosplenomegaly Extremities- no clubbing, cyanosis, or edema;  DP/PT/radial pulses 2+ bilaterally MS- no significant deformity or atrophy Skin- warm and dry, no rash or lesion Psych- euthymic mood, full affect Neuro- strength and sensation are intact  EKG is ordered. Personal review of EKG from today shows Sinus bradycardia at 51 bpm, QRS 82 ms, PR interval 188 ms, personally reviewed.   Assessment and Plan:  1. Paroxysmal atrial fibrillation Doing well overall.  Continue appropriately dosed eliquis for CHA2DS2VASC of at least 6  Continue toprol Continue propanolol as needed for breakthrough AF    2. Sinus bradycardia Stable. PR interval 188 ms  3. HTN Continue current medications. She is on clonidine prn. Would management conservatively with risks of falls on Eliquis if were to drop suddenly.   4. Motor Alzheimer's dementia On Aricept and doing well overall followed by Neurology  Doing well overall. Labs today on Eliquis, lasix, and losartan. RTC 12 months. Sooner with symptoms.   Shirley Friar, PA-C  03/12/19 11:59 AM

## 2019-03-12 NOTE — Patient Instructions (Signed)
Medication Instructions:  none *If you need a refill on your cardiac medications before your next appointment, please call your pharmacy*  Lab Work:TODAY CBC BMET If you have labs (blood work) drawn today and your tests are completely normal, you will receive your results only by: Marland Kitchen MyChart Message (if you have MyChart) OR . A paper copy in the mail If you have any lab test that is abnormal or we need to change your treatment, we will call you to review the results.  Testing/Procedures: none  Follow-Up: At [facility], you and your health needs are our priority.  As part of our continuing mission to provide you with exceptional heart care, we have created designated Provider Care Teams.  These Care Teams include your primary Cardiologist (physician) and Advanced Practice Providers (APPs -  Physician Assistants and Nurse Practitioners) who all work together to provide you with the care you need, when you need it.  Your next appointment:   1 year(s)  The format for your next appointment:   Either In Person or Virtual  Provider:   Dr Caryl Comes  Other Instructions

## 2019-03-13 DIAGNOSIS — H5203 Hypermetropia, bilateral: Secondary | ICD-10-CM | POA: Diagnosis not present

## 2019-03-13 DIAGNOSIS — H26493 Other secondary cataract, bilateral: Secondary | ICD-10-CM | POA: Diagnosis not present

## 2019-03-13 DIAGNOSIS — H33331 Multiple defects of retina without detachment, right eye: Secondary | ICD-10-CM | POA: Diagnosis not present

## 2019-04-25 ENCOUNTER — Ambulatory Visit: Payer: Medicare Other | Attending: Internal Medicine

## 2019-04-25 DIAGNOSIS — Z23 Encounter for immunization: Secondary | ICD-10-CM

## 2019-04-25 NOTE — Progress Notes (Signed)
   Covid-19 Vaccination Clinic  Name:  JUNE FELDBERG    MRN: [id number] DOB: Dec 19, 1931  04/25/2019  Ms. Callens was observed post Covid-19 immunization for 15 minutes without incidence. She was provided with Vaccine Information Sheet and instruction to access the V-Safe system.   Ms. Fetzer was instructed to call 911 with any severe reactions post vaccine: Marland Kitchen Difficulty breathing  . Swelling of your face and throat  . A fast heartbeat  . A bad rash all over your body  . Dizziness and weakness    Immunizations Administered    Name Date Dose VIS Date Route   Pfizer COVID-19 Vaccine 04/25/2019  4:12 PM 0.3 mL 03/14/2019 Intramuscular   Manufacturer: [place]   Lot: [id number]   [place]: [id number]

## 2019-05-16 ENCOUNTER — Ambulatory Visit: Payer: Medicare Other | Attending: Internal Medicine

## 2019-05-16 DIAGNOSIS — Z23 Encounter for immunization: Secondary | ICD-10-CM | POA: Insufficient documentation

## 2019-05-16 NOTE — Progress Notes (Signed)
   Covid-19 Vaccination Clinic  Name:  Kelly Boone    MRN: [id number] DOB: 05-29-1931  05/16/2019  [name] was observed post Covid-19 immunization for 15 minutes without incidence. She was provided with Vaccine Information Sheet and instruction to access the V-Safe system.   [name] was instructed to call 911 with any severe reactions post vaccine: Marland Kitchen Difficulty breathing  . Swelling of your face and throat  . A fast heartbeat  . A bad rash all over your body  . Dizziness and weakness    Immunizations Administered    Name Date Dose VIS Date Route   Pfizer COVID-19 Vaccine 05/16/2019  1:38 PM 0.3 mL 03/14/2019 Intramuscular   Manufacturer: [place]   Lot: [id number]   [place]: [id number]

## 2019-05-20 ENCOUNTER — Other Ambulatory Visit: Payer: Self-pay | Admitting: Internal Medicine

## 2019-05-24 ENCOUNTER — Other Ambulatory Visit: Payer: Self-pay | Admitting: Internal Medicine

## 2019-05-26 ENCOUNTER — Other Ambulatory Visit: Payer: Self-pay

## 2019-05-26 NOTE — Telephone Encounter (Signed)
Patient is requesting a refill on Labetalol 200 mg, 1 tablet by mouth twice a day. This medication is on her allergy list. Received a fax from the pharmacy stating that patient says she is not allergic to the medication, she has been taking the medication without issue.

## 2019-05-28 ENCOUNTER — Other Ambulatory Visit: Payer: Self-pay | Admitting: Internal Medicine

## 2019-05-28 NOTE — Telephone Encounter (Signed)
Pt last saw Barrington Ellison, [place] 03/12/19, last labs 03/12/19 Creat 0.85, age 86, weight 73.8kg, based on specified criteria pt is on appropriate dosage of Eliquis 5mg  BID.  Will refill rx.

## 2019-06-03 DIAGNOSIS — S61451A Open bite of right hand, initial encounter: Secondary | ICD-10-CM | POA: Diagnosis not present

## 2019-08-12 DIAGNOSIS — F418 Other specified anxiety disorders: Secondary | ICD-10-CM | POA: Diagnosis not present

## 2019-08-12 DIAGNOSIS — Z79899 Other long term (current) drug therapy: Secondary | ICD-10-CM | POA: Diagnosis not present

## 2019-08-12 DIAGNOSIS — I10 Essential (primary) hypertension: Secondary | ICD-10-CM | POA: Diagnosis not present

## 2019-08-12 DIAGNOSIS — E7800 Pure hypercholesterolemia, unspecified: Secondary | ICD-10-CM | POA: Diagnosis not present

## 2019-08-12 DIAGNOSIS — E1169 Type 2 diabetes mellitus with other specified complication: Secondary | ICD-10-CM | POA: Diagnosis not present

## 2019-09-18 DIAGNOSIS — H81399 Other peripheral vertigo, unspecified ear: Secondary | ICD-10-CM | POA: Diagnosis not present

## 2019-12-15 DIAGNOSIS — R197 Diarrhea, unspecified: Secondary | ICD-10-CM | POA: Diagnosis not present

## 2019-12-16 DIAGNOSIS — K529 Noninfective gastroenteritis and colitis, unspecified: Secondary | ICD-10-CM | POA: Diagnosis not present

## 2020-02-02 DIAGNOSIS — Z23 Encounter for immunization: Secondary | ICD-10-CM | POA: Diagnosis not present

## 2020-02-09 DIAGNOSIS — Z23 Encounter for immunization: Secondary | ICD-10-CM | POA: Diagnosis not present

## 2020-02-19 ENCOUNTER — Encounter: Payer: Self-pay | Admitting: Neurology

## 2020-02-19 ENCOUNTER — Ambulatory Visit (INDEPENDENT_AMBULATORY_CARE_PROVIDER_SITE_OTHER): Payer: Medicare Other | Admitting: Neurology

## 2020-02-19 ENCOUNTER — Other Ambulatory Visit: Payer: Self-pay

## 2020-02-19 VITALS — BP 150/75 | HR 57 | Ht 62.0 in | Wt 160.0 lb

## 2020-02-19 DIAGNOSIS — G309 Alzheimer's disease, unspecified: Secondary | ICD-10-CM | POA: Diagnosis not present

## 2020-02-19 DIAGNOSIS — G2581 Restless legs syndrome: Secondary | ICD-10-CM

## 2020-02-19 DIAGNOSIS — F0280 Dementia in other diseases classified elsewhere without behavioral disturbance: Secondary | ICD-10-CM

## 2020-02-19 MED ORDER — PRAMIPEXOLE DIHYDROCHLORIDE 1 MG PO TABS
1.0000 mg | ORAL_TABLET | Freq: Every day | ORAL | 3 refills | Status: DC
Start: 2020-02-19 — End: 2021-03-04

## 2020-02-19 MED ORDER — DONEPEZIL HCL 10 MG PO TABS: 10.0000 mg | ORAL_TABLET | Freq: Every day | ORAL | 3 refills | Status: DC

## 2020-02-19 NOTE — Progress Notes (Signed)
Follow-up Visit   Date: 02/19/20   Kelly Boone MRN: [id number] DOB: [birth year]   Interim History: Kelly Boone is a 86 y.o. right-handed Caucasian female with atrial fibrillation, hypertension, CKD, depression, anxiety, diabetes mellitus, GERD, and hyperlipidemia returning to the clinic for follow-up of restless leg syndrome and Alzheimer's dementia.  The patient was accompanied to the clinic by daughter who also provides collateral information.    History of present illness: She had had RLS for many years and pramipexole 0.5mg  for many years, however during the fall of 2019, she began having much more intense discomfort.  She used to have to walk at night to get relief.  Her pramipexole was increased to 1mg  at bedtime which has markedly helped her RLS.  She does not complain of daytime symptoms.  She had had two spells of generalized shaking/trembling of the body, which lasts about 4-5 minutes.  There is no loss consciousness, weakness, numbness/tingling, or syncopal spells.  She had a video which shows high amplitude tremor of the trunk, which did not disrupt her ability to walk.   UPDATE 02/19/2020: Over the past year, there has been gradually deterioration of memory and change in behavior.  She is able to perform all ADLS as well as IADLs, including driving (locally).  Daughter does not have safety concerns with driving.  She spends most of her time with her 37 year-old sister to go to lunch, [place], and the bank. When at home, she tries to help with household chores, but daughter feels that it is not always very helpful.  She gives an example of patient cleaning the garage, but noticing that the boxes were packed to drop off at [place].  For years, she has baked brownies and recently daughter has noticed that she is having difficulty using measuring cups when cooking.  For instance, she cannot tell the difference between 1/3 cup and 1/4 cup. She is forgetting to take her  medication about once per week. She is having difficulty comprehending information.  Mood is good.  Patient is complaining about right sided achy hip pain.  Medications:  Current Outpatient Medications on File Prior to Visit  Medication Sig Dispense Refill  . albuterol (PROVENTIL HFA;VENTOLIN HFA) 108 (90 BASE) MCG/ACT inhaler Inhale 2 puffs into the lungs every 6 (six) hours as needed for wheezing or shortness of breath.    Marland Kitchen amLODipine (NORVASC) 2.5 MG tablet TAKE ONE TABLET BY MOUTH ONCE DAILY 30 tablet 5  . BIOTIN PO Take 5,000 mg by mouth daily.     . Cholecalciferol (VITAMIN D PO) Take 1,000 Units by mouth daily.    . citalopram (CELEXA) 10 MG tablet Take 1 tablet by mouth daily.  3  . cloNIDine (CATAPRES) 0.2 MG tablet Take 0.2 mg by mouth as needed (For BP above 160).    . cyanocobalamin 500 MCG tablet Take 1,000 mcg by mouth daily.     Marland Kitchen donepezil (ARICEPT) 10 MG tablet Take 1 tablet (10 mg total) by mouth daily. 90 tablet 3  . ELIQUIS 5 MG TABS tablet Take 1 tablet by mouth twice daily 180 tablet 1  . furosemide (LASIX) 20 MG tablet TAKE ONE TABLET BY MOUTH ONCE DAILY AS NEEDED FOR  EDEMA 30 tablet 10  . labetalol (NORMODYNE) 200 MG tablet Take 200 mg by mouth 2 (two) times daily.    Marland Kitchen losartan (COZAAR) 50 MG tablet Take 1 tablet by mouth daily.  3  . Magnesium 250 MG TABS Take  2 tablets by mouth every morning.     . metoprolol tartrate (LOPRESSOR) 25 MG tablet Take 1 tablet (25 mg total) by mouth 2 (two) times daily. 180 tablet 3  . pramipexole (MIRAPEX) 1 MG tablet Take 1 tablet (1 mg total) by mouth at bedtime. 90 tablet 3  . propranolol (INDERAL) 10 MG tablet Take 1 tablet (10 mg total) by mouth as needed. 90 tablet 3   Current Facility-Administered Medications on File Prior to Visit  Medication Dose Route Frequency Provider Last Rate Last Admin  . propranolol (INDERAL) tablet 10 mg  10 mg Oral Q6H PRN Deboraha Sprang, MD        Allergies:  Allergies  Allergen Reactions  .  Daypro [Oxaprozin] Other (See Comments)    Palpitation  . Statins Other (See Comments)    Myalgias  Patient denies this allergy at preop visit 10-08-14  . Zetia [Ezetimibe] Other (See Comments)    Joint Pain, Swelling    Vital Signs:  BP (!) 150/75   Pulse (!) 57   Ht 5\' 2"  (1.575 m)   Wt 160 lb (72.6 kg)   SpO2 99%   BMI 29.26 kg/m   Neurological Exam: MENTAL STATUS including orientation to time, place, person, recent and remote memory, attention span and concentration, language, and fund of knowledge is fair.  Correctly identified year, month, and president.  Incorrectly states the day of the week.  Speech is not dysarthric.  CRANIAL NERVES:  Pupils equal round and reactive to light.  Normal conjugate, extra-ocular eye movements in all directions of gaze.  No ptosis.    MOTOR:  Motor strength is 5/5 in all extremities.  No atrophy, fasciculations or abnormal movements.  No pronator drift.  Tone is normal.    COORDINATION/GAIT:   Intact rapid alternating movements bilaterally.  Gait narrow based and stable. Unassisted  Data:  Lab Results  Component Value Date   TSH 4.26 04/08/2018   Lab Results  Component Value Date   VITAMINB12 920 (H) 04/08/2018     IMPRESSION/PLAN: 1.  Restless leg syndrome, well controlled on pramipexole 1 mg at bedtime.  Refills provided.  2.  Alzheimer's dementia, mild progression as expected with disease course.  Despite this, she remains highly independent.  I have asked daughter Henrietta Dine) to star to oversee medications, finances, and driving.  Continue Aricept 10mg  daily.  Discussed adding Namenda, but given low overall benefit, decided not to add any more medications  3.  Evidence of truncal tremor, ?  Orthostatic tremor.  Symptoms are very infrequent.  If symptoms become bothersome or more frequent, start low-dose benzodiazepine.  4.  Right hip pain, take tylenol/NSAIDs.  If no improvement, follow-up with PCP  Return to clinic in 1 year   Thank  you for allowing me to participate in patient's care.  If I can answer any additional questions, I would be pleased to do so.    Sincerely,    Avrie Kedzierski K. Posey Pronto, DO

## 2020-02-19 NOTE — Patient Instructions (Addendum)
It was great to see you today.  Continue your medications as you are taking  Return to clinic 1 year

## 2020-03-02 DIAGNOSIS — E1169 Type 2 diabetes mellitus with other specified complication: Secondary | ICD-10-CM | POA: Diagnosis not present

## 2020-03-02 DIAGNOSIS — I10 Essential (primary) hypertension: Secondary | ICD-10-CM | POA: Diagnosis not present

## 2020-03-04 ENCOUNTER — Other Ambulatory Visit: Payer: Self-pay | Admitting: Internal Medicine

## 2020-03-05 ENCOUNTER — Telehealth: Payer: Self-pay | Admitting: Student

## 2020-03-05 NOTE — Telephone Encounter (Signed)
° ° °  Pt's daughter calling, she said per pharmacy pt needs blood work before her yearly f/u with Jonni Sanger. No order for lab work on file p

## 2020-03-05 NOTE — Telephone Encounter (Signed)
Prescription refill request for Eliquis received. Indication:Afib Last office visit: [place], 03/12/2019 Scr: 0.85, 03/12/2019 Age: 86 yo Weight: 72.6 kg    Pt is due for an office visist and lab

## 2020-03-05 NOTE — Telephone Encounter (Signed)
Went and spoke with Kelly Boone on site. He advised that the patient could wait until her up coming appointment for any labs that need to be done.  Advised the daughter. Agreeable and verbalized understanding.

## 2020-03-05 NOTE — Telephone Encounter (Signed)
Appointment scheduled. Pt on appointment note for pt to have a cbc and bmet.

## 2020-03-05 NOTE — Telephone Encounter (Signed)
Called and spoke to pt daughter. Informed her pt will need to schedule a follow up appointment with Dr. Edythe Clarity refill medication. Transferred daughter to the mainline to make appointment.

## 2020-03-23 ENCOUNTER — Other Ambulatory Visit: Payer: Self-pay

## 2020-03-23 ENCOUNTER — Ambulatory Visit (INDEPENDENT_AMBULATORY_CARE_PROVIDER_SITE_OTHER): Payer: Medicare Other | Admitting: Student

## 2020-03-23 ENCOUNTER — Encounter: Payer: Self-pay | Admitting: Student

## 2020-03-23 VITALS — BP 150/74 | HR 58 | Ht 62.0 in | Wt 158.2 lb

## 2020-03-23 DIAGNOSIS — R001 Bradycardia, unspecified: Secondary | ICD-10-CM | POA: Diagnosis not present

## 2020-03-23 DIAGNOSIS — I4891 Unspecified atrial fibrillation: Secondary | ICD-10-CM

## 2020-03-23 DIAGNOSIS — I10 Essential (primary) hypertension: Secondary | ICD-10-CM

## 2020-03-23 LAB — CBC WITH DIFFERENTIAL/PLATELET
Basophils Absolute: 0 10*3/uL (ref 0.0–0.2)
Basos: 0 %
EOS (ABSOLUTE): 0.1 10*3/uL (ref 0.0–0.4)
Eos: 2 %
Hematocrit: 31.1 % — ABNORMAL LOW (ref 34.0–46.6)
Hemoglobin: 10.7 g/dL — ABNORMAL LOW (ref 11.1–15.9)
Immature Grans (Abs): 0 10*3/uL (ref 0.0–0.1)
Immature Granulocytes: 0 %
Lymphocytes Absolute: 1.8 10*3/uL (ref 0.7–3.1)
Lymphs: 24 %
MCH: 32.1 pg (ref 26.6–33.0)
MCHC: 34.4 g/dL (ref 31.5–35.7)
MCV: 93 fL (ref 79–97)
Monocytes Absolute: 0.8 10*3/uL (ref 0.1–0.9)
Monocytes: 11 %
Neutrophils Absolute: 4.7 10*3/uL (ref 1.4–7.0)
Neutrophils: 63 %
Platelets: 393 10*3/uL (ref 150–450)
RBC: 3.33 x10E6/uL — ABNORMAL LOW (ref 3.77–5.28)
RDW: 13 % (ref 11.7–15.4)
WBC: 7.5 10*3/uL (ref 3.4–10.8)

## 2020-03-23 LAB — BASIC METABOLIC PANEL
BUN/Creatinine Ratio: 14 (ref 12–28)
BUN: 13 mg/dL (ref 8–27)
CO2: 23 mmol/L (ref 20–29)
Calcium: 9.1 mg/dL (ref 8.7–10.3)
Chloride: 98 mmol/L (ref 96–106)
Creatinine, Ser: 0.95 mg/dL (ref 0.57–1.00)
GFR calc Af Amer: 63 mL/min/{1.73_m2} (ref >59)
GFR calc non Af Amer: 54 mL/min/{1.73_m2} — ABNORMAL LOW (ref >59)
Glucose: 93 mg/dL (ref 65–99)
Potassium: 4.9 mmol/L (ref 3.5–5.2)
Sodium: 134 mmol/L (ref 134–144)

## 2020-03-23 NOTE — Patient Instructions (Signed)
Medication Instructions:  *If you need a refill on your cardiac medications before your next appointment, please call your pharmacy*  Lab Work: Your physician has recommended that you have lab work today: BMET and CBC If you have labs (blood work) drawn today and your tests are completely normal, you will receive your results only by: Marland Kitchen MyChart Message (if you have MyChart) OR . A paper copy in the mail If you have any lab test that is abnormal or we need to change your treatment, we will call you to review the results.  Follow-Up: At [facility], you and your health needs are our priority.  As part of our continuing mission to provide you with exceptional heart care, we have created designated Provider Care Teams.  These Care Teams include your primary Cardiologist (physician) and Advanced Practice Providers (APPs -  Physician Assistants and Nurse Practitioners) who all work together to provide you with the care you need, when you need it.  We recommend signing up for the patient portal called "MyChart".  Sign up information is provided on this After Visit Summary.  MyChart is used to connect with patients for Virtual Visits (Telemedicine).  Patients are able to view lab/test results, encounter notes, upcoming appointments, etc.  Non-urgent messages can be sent to your provider as well.   To learn more about what you can do with MyChart, go to [email or web address].    Your next appointment:   Your physician wants you to follow-up in: 1 YEAR with Dr. Caryl Comes. You will receive a reminder letter in the mail two months in advance. If you don't receive a letter, please call our office to schedule the follow-up appointment.  The format for your next appointment:   In Person with Virl Axe, MD

## 2020-03-23 NOTE — Progress Notes (Signed)
PCP:  Ronita Hipps, MD Primary Cardiologist: No primary care provider on file. Electrophysiologist: Virl Axe, MD   Kelly Boone is a 86 y.o. female seen today for Virl Axe, MD for routine electrophysiology followup.  Since last being seen in our clinic the patient reports doing very well.  she denies chest pain, palpitations, dyspnea, PND, orthopnea, nausea, vomiting, dizziness, syncope, edema, weight gain, or early satiety. She has not needed as needed lasix or propranolol for as long as she can remember.   Past Medical History:  Diagnosis Date  . A-fib (HCC)    irregular heartbeat  . Allergic rhinitis   . Anxiety   . Arthritis   . Asthma   . Atopic dermatitis   . Benign essential hypertension   . Chronic kidney disease    hx of kidney stones as teenager  . Depression   . Disorders of magnesium metabolism   . DM (diabetes mellitus) ([place]) 2004  . Edema   . Encounter for long-term (current) use of other medications   . Esophagitis   . GERD (gastroesophageal reflux disease)   . Headache   . Hearing loss   . Hypercalcemia   . Hyperlipemia 1997  . Hypertension 1997  . Hyposmolality and/or hyponatremia   . Myalgia and myositis, unspecified   . Myocardial infarction [facility])    June 2015, was told caused by stress  . Need for prophylactic vaccination and inoculation against influenza   . Osteoporosis, unspecified   . Other screening mammogram   . Other syndromes affecting cervical region   . Pain in joint, lower leg   . Pain in joint, shoulder region   . Peripheral autonomic neuropathy in disorders classified elsewhere(337.1)   . Pneumonia    as infant  . Restless legs syndrome (RLS)   . Screening for depression   . Sinus bradycardia   . Special screening for malignant neoplasms, colon   . Type 2 diabetes mellitus with autonomic neuropathy ([place])   . Unspecified essential hypertension   . Unspecified vitamin D deficiency   . Urinary tract infection, site not  specified   . Varicose veins of lower extremities with inflammation    Past Surgical History:  Procedure Laterality Date  . ABDOMINAL HYSTERECTOMY    . APPENDECTOMY    . benign breast biopsy    . CHOLECYSTECTOMY    . LEFT HEART CATHETERIZATION WITH CORONARY ANGIOGRAM N/A 09/30/2013   Procedure: LEFT HEART CATHETERIZATION WITH CORONARY ANGIOGRAM;  Surgeon: Troy Sine, MD;  Location: [facility] CATH LAB;  Service: Cardiovascular;  Laterality: N/A;  . OTHER SURGICAL HISTORY     Nuclear Stress Test with EF 70%, Chest Radiograph-lungs are clear without pleural effusions or focal consolidations. Trachea projects midline and there is no pneumothorax. Suture anchors in right humeral head.  Marland Kitchen SHOULDER SURGERY     Repair  . TONSILLECTOMY    . TOTAL KNEE ARTHROPLASTY Left 10/14/2014   Procedure: LEFT TOTAL KNEE ARTHROPLASTY;  Surgeon: Latanya Maudlin, MD;  Location: [facility] ORS;  Service: Orthopedics;  Laterality: Left;    Current Outpatient Medications  Medication Sig Dispense Refill  . albuterol (PROVENTIL HFA;VENTOLIN HFA) 108 (90 BASE) MCG/ACT inhaler Inhale 2 puffs into the lungs every 6 (six) hours as needed for wheezing or shortness of breath.    Marland Kitchen amLODipine (NORVASC) 2.5 MG tablet TAKE ONE TABLET BY MOUTH ONCE DAILY 30 tablet 5  . BIOTIN PO Take 5,000 mg by mouth daily.     . Cholecalciferol (  VITAMIN D PO) Take 1,000 Units by mouth daily.    . citalopram (CELEXA) 10 MG tablet Take 1 tablet by mouth daily.  3  . cloNIDine (CATAPRES) 0.2 MG tablet Take 0.2 mg by mouth as needed (For BP above 160).    . cyanocobalamin 500 MCG tablet Take 1,000 mcg by mouth daily.     Marland Kitchen donepezil (ARICEPT) 10 MG tablet Take 1 tablet (10 mg total) by mouth daily. 90 tablet 3  . ELIQUIS 5 MG TABS tablet Take 1 tablet by mouth twice daily 60 tablet 0  . furosemide (LASIX) 20 MG tablet TAKE ONE TABLET BY MOUTH ONCE DAILY AS NEEDED FOR  EDEMA 30 tablet 10  . labetalol (NORMODYNE) 200 MG tablet Take 200 mg by mouth 2 (two)  times daily.    Marland Kitchen losartan (COZAAR) 50 MG tablet Take 1 tablet by mouth daily.  3  . Magnesium 250 MG TABS Take 2 tablets by mouth every morning.     . metoprolol tartrate (LOPRESSOR) 25 MG tablet Take 1 tablet (25 mg total) by mouth 2 (two) times daily. 180 tablet 3  . pramipexole (MIRAPEX) 1 MG tablet Take 1 tablet (1 mg total) by mouth at bedtime. 90 tablet 3  . propranolol (INDERAL) 10 MG tablet Take 1 tablet (10 mg total) by mouth as needed. 90 tablet 3   Current Facility-Administered Medications  Medication Dose Route Frequency Provider Last Rate Last Admin  . propranolol (INDERAL) tablet 10 mg  10 mg Oral Q6H PRN Deboraha Sprang, MD        Allergies  Allergen Reactions  . Daypro [Oxaprozin] Other (See Comments)    Palpitation  . Statins Other (See Comments)    Myalgias  Patient denies this allergy at preop visit 10-08-14  . Zetia [Ezetimibe] Other (See Comments)    Joint Pain, Swelling    Social History   Socioeconomic History  . Marital status: Widowed    Spouse name: Not on file  . Number of children: 2  . Years of education: 73  . Highest education level: High school graduate  Occupational History  . Occupation: retired    Fish farm manager: Engineer, [employer]  Tobacco Use  . Smoking status: Never Smoker  . Smokeless tobacco: Never Used  Vaping Use  . Vaping Use: Never used  Substance and Sexual Activity  . Alcohol use: No  . Drug use: No  . Sexual activity: Not on file  Other Topics Concern  . Not on file  Social History Narrative   Lives with daughter in a one story home.  Has 2 children.  Retired from [employer].  Education: high school.     Right Handed   Social Determinants of Health   Financial Resource Strain: Not on file  Food Insecurity: Not on file  Transportation Needs: Not on file  Physical Activity: Not on file  Stress: Not on file  Social Connections: Not on file  Intimate Partner Violence: Not on file     Review of Systems: General: No  chills, fever, night sweats or weight changes  Cardiovascular:  No chest pain, dyspnea on exertion, edema, orthopnea, palpitations, paroxysmal nocturnal dyspnea Dermatological: No rash, lesions or masses Respiratory: No cough, dyspnea Urologic: No hematuria, dysuria Abdominal: No nausea, vomiting, diarrhea, bright red blood per rectum, melena, or hematemesis Neurologic: No visual changes, weakness, changes in mental status All other systems reviewed and are otherwise negative except as noted above.  Physical Exam: There were no vitals  filed for this visit.  GEN- The patient is well appearing, alert and oriented x 3 today.   HEENT: normocephalic, atraumatic; sclera clear, conjunctiva pink; hearing intact; oropharynx clear; neck supple, no JVP Lymph- no cervical lymphadenopathy Lungs- Clear to ausculation bilaterally, normal work of breathing.  No wheezes, rales, rhonchi Heart- Regular rate and rhythm, no murmurs, rubs or gallops, PMI not laterally displaced GI- soft, non-tender, non-distended, bowel sounds present, no hepatosplenomegaly Extremities- no clubbing, cyanosis, or edema; DP/PT/radial pulses 2+ bilaterally MS- no significant deformity or atrophy Skin- warm and dry, no rash or lesion Psych- euthymic mood, full affect Neuro- strength and sensation are intact  EKG is ordered. Personal review of EKG from today shows sinus brady at 58 bpm  Additional studies reviewed include: Previous EP office notes  Assessment and Plan:  1. Paroxysmal atrial fibrillation Doing well overall No change to appropriately dosed eliquis for CHA2DS2VASC of at least 6  Continue toprol Continue propanolol as needed for breakthrough AF    2. Sinus bradycardia Stable. PR interval 184 ms  3. HTN Continue current medications. BP running mid 120s at home.  She is on clonidine prn. Would manage conservatively with risks of falls on Eliquis if were to drop suddenly.   4. Motor Alzheimer's  dementia On Aricept and doing well overall followed by Neurology No change  She is doing very well. Labs today. Continue yearly follow up with Dr. Caryl Comes or EP APP team.   Shirley Friar, PA-C  03/23/20 10:08 AM

## 2020-04-04 ENCOUNTER — Other Ambulatory Visit: Payer: Self-pay | Admitting: Internal Medicine

## 2020-04-04 DIAGNOSIS — I4891 Unspecified atrial fibrillation: Secondary | ICD-10-CM

## 2020-04-05 NOTE — Telephone Encounter (Signed)
Prescription refill request for Eliquis received. Indication: a fib Last office visit: 03/23/20 Scr: 0.95 Age: 87 Weight: 71kg

## 2020-05-18 ENCOUNTER — Other Ambulatory Visit: Payer: Self-pay | Admitting: Neurology

## 2020-05-22 ENCOUNTER — Other Ambulatory Visit: Payer: Self-pay | Admitting: Neurology

## 2020-06-02 DIAGNOSIS — S61411A Laceration without foreign body of right hand, initial encounter: Secondary | ICD-10-CM | POA: Diagnosis not present

## 2020-06-04 DIAGNOSIS — M19012 Primary osteoarthritis, left shoulder: Secondary | ICD-10-CM | POA: Diagnosis not present

## 2020-06-04 DIAGNOSIS — Z79899 Other long term (current) drug therapy: Secondary | ICD-10-CM | POA: Diagnosis not present

## 2020-06-04 DIAGNOSIS — R944 Abnormal results of kidney function studies: Secondary | ICD-10-CM | POA: Diagnosis not present

## 2020-06-04 DIAGNOSIS — R0602 Shortness of breath: Secondary | ICD-10-CM | POA: Diagnosis not present

## 2020-06-04 DIAGNOSIS — S43082A Other subluxation of left shoulder joint, initial encounter: Secondary | ICD-10-CM | POA: Diagnosis not present

## 2020-06-04 DIAGNOSIS — E871 Hypo-osmolality and hyponatremia: Secondary | ICD-10-CM | POA: Diagnosis not present

## 2020-06-04 DIAGNOSIS — N289 Disorder of kidney and ureter, unspecified: Secondary | ICD-10-CM | POA: Diagnosis not present

## 2020-06-15 ENCOUNTER — Other Ambulatory Visit: Payer: Self-pay | Admitting: Internal Medicine

## 2020-06-24 ENCOUNTER — Other Ambulatory Visit: Payer: Self-pay | Admitting: Student

## 2020-07-21 ENCOUNTER — Other Ambulatory Visit: Payer: Self-pay | Admitting: Internal Medicine

## 2020-07-21 DIAGNOSIS — I4891 Unspecified atrial fibrillation: Secondary | ICD-10-CM

## 2020-07-21 NOTE — Telephone Encounter (Signed)
Prescription refill request for Eliquis received.  Indication: Afib Last office visit:  [place] Scr: 0.95,  05/25/2019 Age: 87 yo  Weight: 71.8 kg   Pt is on the correct dose of Eliquis per dosing criteria, prescription refill sent for Eliquis 5mg  BID.

## 2020-08-24 DIAGNOSIS — M79601 Pain in right arm: Secondary | ICD-10-CM | POA: Diagnosis not present

## 2020-08-24 DIAGNOSIS — L03113 Cellulitis of right upper limb: Secondary | ICD-10-CM | POA: Diagnosis not present

## 2020-08-24 DIAGNOSIS — S51811A Laceration without foreign body of right forearm, initial encounter: Secondary | ICD-10-CM | POA: Diagnosis not present

## 2020-08-25 ENCOUNTER — Other Ambulatory Visit: Payer: Self-pay | Admitting: Neurology

## 2020-08-27 DIAGNOSIS — L03113 Cellulitis of right upper limb: Secondary | ICD-10-CM | POA: Diagnosis not present

## 2020-08-27 DIAGNOSIS — S51811D Laceration without foreign body of right forearm, subsequent encounter: Secondary | ICD-10-CM | POA: Diagnosis not present

## 2020-08-27 DIAGNOSIS — M79601 Pain in right arm: Secondary | ICD-10-CM | POA: Diagnosis not present

## 2020-09-23 ENCOUNTER — Other Ambulatory Visit: Payer: Self-pay | Admitting: Internal Medicine

## 2020-09-24 DIAGNOSIS — Z79899 Other long term (current) drug therapy: Secondary | ICD-10-CM | POA: Diagnosis not present

## 2020-09-24 DIAGNOSIS — Z6828 Body mass index (BMI) 28.0-28.9, adult: Secondary | ICD-10-CM | POA: Diagnosis not present

## 2020-09-24 DIAGNOSIS — E7800 Pure hypercholesterolemia, unspecified: Secondary | ICD-10-CM | POA: Diagnosis not present

## 2020-09-24 DIAGNOSIS — E1169 Type 2 diabetes mellitus with other specified complication: Secondary | ICD-10-CM | POA: Diagnosis not present

## 2020-09-24 DIAGNOSIS — Z0000 Encounter for general adult medical examination without abnormal findings: Secondary | ICD-10-CM | POA: Diagnosis not present

## 2020-10-16 DIAGNOSIS — R232 Flushing: Secondary | ICD-10-CM | POA: Diagnosis not present

## 2020-10-16 DIAGNOSIS — Z20828 Contact with and (suspected) exposure to other viral communicable diseases: Secondary | ICD-10-CM | POA: Diagnosis not present

## 2020-10-16 DIAGNOSIS — R110 Nausea: Secondary | ICD-10-CM | POA: Diagnosis not present

## 2020-11-23 ENCOUNTER — Institutional Professional Consult (permissible substitution): Payer: Medicare Other | Admitting: Neurology

## 2020-12-31 DIAGNOSIS — E1169 Type 2 diabetes mellitus with other specified complication: Secondary | ICD-10-CM | POA: Diagnosis not present

## 2020-12-31 DIAGNOSIS — F418 Other specified anxiety disorders: Secondary | ICD-10-CM | POA: Diagnosis not present

## 2020-12-31 DIAGNOSIS — I10 Essential (primary) hypertension: Secondary | ICD-10-CM | POA: Diagnosis not present

## 2021-01-14 DIAGNOSIS — Z23 Encounter for immunization: Secondary | ICD-10-CM | POA: Diagnosis not present

## 2021-02-01 ENCOUNTER — Other Ambulatory Visit: Payer: Self-pay | Admitting: Neurology

## 2021-02-01 ENCOUNTER — Other Ambulatory Visit: Payer: Self-pay | Admitting: Internal Medicine

## 2021-02-01 DIAGNOSIS — I4891 Unspecified atrial fibrillation: Secondary | ICD-10-CM

## 2021-02-01 NOTE — Telephone Encounter (Signed)
Pt last saw Oda Kilts, [place] 03/23/20, last labs 03/23/20 Creat 0.95, age 87, weight 71.8kg, based on specified criteria pt is on appropriate dosage of Eliquis 5mg  BID for afib.  Will refill rx.

## 2021-02-02 DIAGNOSIS — N3001 Acute cystitis with hematuria: Secondary | ICD-10-CM | POA: Diagnosis not present

## 2021-02-02 DIAGNOSIS — N3090 Cystitis, unspecified without hematuria: Secondary | ICD-10-CM | POA: Diagnosis not present

## 2021-02-22 ENCOUNTER — Other Ambulatory Visit: Payer: Self-pay | Admitting: Neurology

## 2021-02-22 NOTE — Telephone Encounter (Signed)
OK to refill

## 2021-02-23 DIAGNOSIS — R5381 Other malaise: Secondary | ICD-10-CM | POA: Diagnosis not present

## 2021-02-23 DIAGNOSIS — R519 Headache, unspecified: Secondary | ICD-10-CM | POA: Diagnosis not present

## 2021-03-03 ENCOUNTER — Ambulatory Visit: Payer: Medicare Other | Admitting: Neurology

## 2021-03-04 ENCOUNTER — Other Ambulatory Visit: Payer: Self-pay

## 2021-03-04 ENCOUNTER — Ambulatory Visit (INDEPENDENT_AMBULATORY_CARE_PROVIDER_SITE_OTHER): Payer: Medicare Other | Admitting: Neurology

## 2021-03-04 ENCOUNTER — Encounter: Payer: Self-pay | Admitting: Neurology

## 2021-03-04 VITALS — BP 134/66 | HR 52 | Resp 18 | Ht 61.0 in | Wt 160.0 lb

## 2021-03-04 DIAGNOSIS — G2581 Restless legs syndrome: Secondary | ICD-10-CM | POA: Diagnosis not present

## 2021-03-04 DIAGNOSIS — G309 Alzheimer's disease, unspecified: Secondary | ICD-10-CM

## 2021-03-04 DIAGNOSIS — F0280 Dementia in other diseases classified elsewhere without behavioral disturbance: Secondary | ICD-10-CM | POA: Diagnosis not present

## 2021-03-04 MED ORDER — PRAMIPEXOLE DIHYDROCHLORIDE 1 MG PO TABS: 1.0000 mg | ORAL_TABLET | Freq: Every day | ORAL | 3 refills | Status: DC

## 2021-03-04 MED ORDER — DONEPEZIL HCL 10 MG PO TABS: 10.0000 mg | ORAL_TABLET | Freq: Two times a day (BID) | ORAL | 3 refills | Status: DC

## 2021-03-04 NOTE — Patient Instructions (Addendum)
Increase Aricept to 9m twice daily  Continue pramipexole 1mg  at bedtime  Do not recommend that you drive  Return to clinic in 1 year

## 2021-03-04 NOTE — Progress Notes (Signed)
Follow-up Visit   Date: 03/04/21   Kelly Boone MRN: [id number] DOB: 01-12-1932   Interim History: Kelly Boone is a 87 y.o. right-handed Caucasian female with atrial fibrillation, hypertension, CKD, depression, anxiety, diabetes mellitus, GERD, and hyperlipidemia returning to the clinic for follow-up of restless leg syndrome and Alzheimer's dementia.  The patient was accompanied to the clinic by daughter who also provides collateral information.    History of present illness: She had had RLS for many years and pramipexole 0.5mg  for many years, however during the fall of 2019, she began having much more intense discomfort.  She used to have to walk at night to get relief.  Her pramipexole was increased to 1mg  at bedtime which has markedly helped her RLS.  She does not complain of daytime symptoms.   She had had two spells of generalized shaking/trembling of the body, which lasts about 4-5 minutes.  There is no loss consciousness, weakness, numbness/tingling, or syncopal spells.  She had a video which shows high amplitude tremor of the trunk, which did not disrupt her ability to walk.   UPDATE 02/19/2020: Over the past year, there has been gradually deterioration of memory and change in behavior.  She is able to perform all ADLS as well as IADLs, including driving (locally).  Daughter does not have safety concerns with driving.  She spends most of her time with her 76 year-old sister to go to lunch, [place], and the bank. When at home, she tries to help with household chores, but daughter feels that it is not always very helpful.  She gives an example of patient cleaning the garage, but noticing that the boxes were packed to drop off at [place].  For years, she has baked brownies and recently daughter has noticed that she is having difficulty using measuring cups when cooking.  For instance, she cannot tell the difference between 1/3 cup and 1/4 cup. She is forgetting to take her  medication about once per week. She is having difficulty comprehending information.  Mood is good.  Patient is complaining about right sided achy hip pain.  UPDATE 03/04/2021:  Over the past year, she has had progressive loss of memory. She is having issues distinguish reality and paranoia.  She does puzzles, enjoys crocheting, and interactive with her sister. She continues to drive daily to a [employer], she did get lost on one occasion. She is forgetting daily hygiene such as bathing and medications, so daughter has started to oversee more of her daily activities.  Daughter is also concerned about her poor diet choices. Patient admits to having a sweet tooth.  Medications:  Current Outpatient Medications on File Prior to Visit  Medication Sig Dispense Refill   albuterol (PROVENTIL HFA;VENTOLIN HFA) 108 (90 BASE) MCG/ACT inhaler Inhale 2 puffs into the lungs every 6 (six) hours as needed for wheezing or shortness of breath.     amLODipine (NORVASC) 2.5 MG tablet TAKE ONE TABLET BY MOUTH ONCE DAILY 30 tablet 5   apixaban (ELIQUIS) 5 MG TABS tablet Take 1 tablet by mouth twice daily 180 tablet 1   BIOTIN PO Take 5,000 mg by mouth daily.      Cholecalciferol (VITAMIN D PO) Take 1,000 Units by mouth daily.     citalopram (CELEXA) 10 MG tablet Take 1 tablet by mouth daily.  3   cloNIDine (CATAPRES) 0.2 MG tablet Take 0.2 mg by mouth as needed (For BP above 160).     cyanocobalamin 500 MCG tablet  Take 1,000 mcg by mouth daily.      diazepam (VALIUM) 5 MG tablet Take 5 mg by mouth as needed.     donepezil (ARICEPT) 10 MG tablet Take 1 tablet by mouth once daily 90 tablet 0   furosemide (LASIX) 20 MG tablet TAKE ONE TABLET BY MOUTH ONCE DAILY AS NEEDED FOR  EDEMA 30 tablet 10   labetalol (NORMODYNE) 200 MG tablet Take 1 tablet by mouth twice daily 180 tablet 1   losartan (COZAAR) 50 MG tablet Take 1 tablet by mouth daily.  3   Magnesium 250 MG TABS Take 2 tablets by mouth every morning.       metoprolol tartrate (LOPRESSOR) 25 MG tablet Take 1 tablet by mouth twice daily 180 tablet 2   pramipexole (MIRAPEX) 1 MG tablet Take 1 tablet (1 mg total) by mouth at bedtime. 90 tablet 3   propranolol (INDERAL) 10 MG tablet Take 1 tablet (10 mg total) by mouth as needed. 90 tablet 3   Current Facility-Administered Medications on File Prior to Visit  Medication Dose Route Frequency Provider Last Rate Last Admin   propranolol (INDERAL) tablet 10 mg  10 mg Oral Q6H PRN Deboraha Sprang, MD        Allergies:  Allergies  Allergen Reactions   Daypro [Oxaprozin] Other (See Comments)    Palpitation   Statins Other (See Comments)    Myalgias  Patient denies this allergy at preop visit 10-08-14   Zetia [Ezetimibe] Other (See Comments)    Joint Pain, Swelling    Vital Signs:  BP 134/66   Pulse (!) 52   Resp 18   Ht 5\' 1"  (1.549 m)   Wt 160 lb (72.6 kg)   SpO2 99%   BMI 30.23 kg/m   Neurological Exam: MENTAL STATUS including orientation to time, place, person, recent and remote memory, attention span and concentration, language, and fund of knowledge is fair.  Correctly identified year, month, and president.  Incorrectly states the day of the week.  Speech is not dysarthric.  CRANIAL NERVES:  Pupils equal round and reactive to light.  Normal conjugate, extra-ocular eye movements in all directions of gaze.  No ptosis.    MOTOR:  Motor strength is 5/5 in all extremities.  No atrophy, fasciculations or abnormal movements.  No pronator drift.  Tone is normal.    COORDINATION/GAIT:   Intact rapid alternating movements bilaterally.  Gait narrow based and stable. Unassisted  Data:  Lab Results  Component Value Date   TSH 4.26 04/08/2018   Lab Results  Component Value Date   VITAMINB12 920 (H) 04/08/2018     IMPRESSION/PLAN: 1.  Restless leg syndrome, well controlled on pramipexole 1 mg at bedtime.  Refills provided.  2.  Alzheimer's dementia, worsening short term memory and  behavior. Patient is having sundowning. Encouraged nonpharmacological intervention such as distraction, reorientation, setting a routine, etc. Increase Aricept to 10mg  twice daily. If behavior continues to be a problem, may consider increasing her celexa Advised against driving  Encouraged healthy diet choices  Return to clinic in 1 year  Total time spent reviewing records, interview, history/exam, documentation, counseling, and coordination of care on day of encounter:  30 min    Thank you for allowing me to participate in patient's care.  If I can answer any additional questions, I would be pleased to do so.    Sincerely,    Paris Hohn K. Posey Pronto, DO

## 2021-03-22 NOTE — Progress Notes (Signed)
PCP:  Ronita Hipps, MD Primary Cardiologist: None Electrophysiologist: Virl Axe, MD   Kelly Boone is a 87 y.o. female seen today for Virl Axe, MD for routine electrophysiology followup.  Since last being seen in our clinic the patient reports doing about the same. Has had continued decline in her memory. At times she complains of HA and her BP reads as high as 720-947 systolic. HRs as low as upper 30s at home.   She has been taken off of dual beta blockers at several visits here with chronic bradycardia.  She has a type of tremor/jerking motion when BP is high at times. Neurology is aware and no new recs thus. She has dizziness at times described as imbalance. No near syncope. she denies chest pain, palpitations, dyspnea, PND, orthopnea, nausea, vomiting,  syncope, edema, weight gain, or early satiety.  Past Medical History:  Diagnosis Date   A-fib ([place])    irregular heartbeat   Allergic rhinitis    Anxiety    Arthritis    Asthma    Atopic dermatitis    Benign essential hypertension    Chronic kidney disease    hx of kidney stones as teenager   Depression    Disorders of magnesium metabolism    DM (diabetes mellitus) ([place]) 2004   Edema    Encounter for long-term (current) use of other medications    Esophagitis    GERD (gastroesophageal reflux disease)    Headache    Hearing loss    Hypercalcemia    Hyperlipemia 1997   Hypertension 1997   Hyposmolality and/or hyponatremia    Myalgia and myositis, unspecified    Myocardial infarction [facility])    June 2015, was told caused by stress   Need for prophylactic vaccination and inoculation against influenza    Osteoporosis, unspecified    Other screening mammogram    Other syndromes affecting cervical region    Pain in joint, lower leg    Pain in joint, shoulder region    Peripheral autonomic neuropathy in disorders classified elsewhere(337.1)    Pneumonia    as infant   Restless legs syndrome (RLS)    Screening for  depression    Sinus bradycardia    Special screening for malignant neoplasms, colon    Type 2 diabetes mellitus with autonomic neuropathy ([place])    Unspecified essential hypertension    Unspecified vitamin D deficiency    Urinary tract infection, site not specified    Varicose veins of lower extremities with inflammation    Past Surgical History:  Procedure Laterality Date   ABDOMINAL HYSTERECTOMY     APPENDECTOMY     benign breast biopsy     CHOLECYSTECTOMY     LEFT HEART CATHETERIZATION WITH CORONARY ANGIOGRAM N/A 09/30/2013   Procedure: LEFT HEART CATHETERIZATION WITH CORONARY ANGIOGRAM;  Surgeon: Troy Sine, MD;  [facility] C[facility] LAB;  Service: Cardiovascular;  Laterality: N/A;   OTHER SURGICAL HISTORY     Nuclear Stress Test with EF 70%, Chest Radiograph-lungs are clear without pleural effusions or focal consolidations. Trachea projects midline and there is no pneumothorax. Suture anchors in right humeral head.   SHOULDER SURGERY     Repair   TONSILLECTOMY     TOTAL KNEE ARTHROPLASTY Left 10/14/2014   Procedure: LEFT TOTAL KNEE ARTHROPLASTY;  Surgeon: Latanya Maudlin, MD;  Location: [facility] ORS;  Service: Orthopedics;  Laterality: Left;    Current Outpatient Medications  Medication Sig Dispense Refill   albuterol (PROVENTIL HFA;VENTOLIN  HFA) 108 (90 BASE) MCG/ACT inhaler Inhale 2 puffs into the lungs every 6 (six) hours as needed for wheezing or shortness of breath.     amLODipine (NORVASC) 2.5 MG tablet TAKE ONE TABLET BY MOUTH ONCE DAILY 30 tablet 5   apixaban (ELIQUIS) 5 MG TABS tablet Take 1 tablet by mouth twice daily 180 tablet 1   BIOTIN PO Take 5,000 mg by mouth daily.      Cholecalciferol (VITAMIN D PO) Take 1,000 Units by mouth daily.     citalopram (CELEXA) 10 MG tablet Take 1 tablet by mouth daily.  3   cloNIDine (CATAPRES) 0.2 MG tablet Take 0.2 mg by mouth as needed (For BP above 160).     cyanocobalamin 500 MCG tablet Take 1,000 mcg by mouth daily.      diazepam  (VALIUM) 5 MG tablet Take 5 mg by mouth as needed.     donepezil (ARICEPT) 10 MG tablet Take 1 tablet (10 mg total) by mouth in the morning and at bedtime. 180 tablet 3   furosemide (LASIX) 20 MG tablet TAKE ONE TABLET BY MOUTH ONCE DAILY AS NEEDED FOR  EDEMA 30 tablet 10   labetalol (NORMODYNE) 200 MG tablet Take 1 tablet by mouth twice daily 180 tablet 1   losartan (COZAAR) 50 MG tablet Take 1 tablet by mouth daily.  3   Magnesium 250 MG TABS Take 2 tablets by mouth every morning.      metoprolol tartrate (LOPRESSOR) 25 MG tablet Take 1 tablet by mouth twice daily 180 tablet 2   pramipexole (MIRAPEX) 1 MG tablet Take 1 tablet (1 mg total) by mouth at bedtime. 90 tablet 3   propranolol (INDERAL) 10 MG tablet Take 1 tablet (10 mg total) by mouth as needed. 90 tablet 3   Current Facility-Administered Medications  Medication Dose Route Frequency Provider Last Rate Last Admin   propranolol (INDERAL) tablet 10 mg  10 mg Oral Q6H PRN Deboraha Sprang, MD        Allergies  Allergen Reactions   Daypro [Oxaprozin] Other (See Comments)    Palpitation   Statins Other (See Comments)    Myalgias  Patient denies this allergy at preop visit 10-08-14   Zetia [Ezetimibe] Other (See Comments)    Joint Pain, Swelling    Social History   Socioeconomic History   Marital status: Widowed    Spouse name: Not on file   Number of children: 2   Years of education: 12   Highest education level: High school graduate  Occupational History   Occupation: retired    Fish farm manager: Engineer, [employer]  Tobacco Use   Smoking status: Never   Smokeless tobacco: Never  Vaping Use   Vaping Use: Never used  Substance and Sexual Activity   Alcohol use: No   Drug use: No   Sexual activity: Not on file  Other Topics Concern   Not on file  Social History Narrative   Lives with daughter in a one story home.  Has 2 children.  Retired from [employer].  Education: high school.     Right Handed   Social Determinants  of Health   Financial Resource Strain: Not on file  Food Insecurity: Not on file  Transportation Needs: Not on file  Physical Activity: Not on file  Stress: Not on file  Social Connections: Not on file  Intimate Partner Violence: Not on file     Review of Systems: All other systems reviewed and are  otherwise negative except as noted above.  Physical Exam: There were no vitals filed for this visit.  GEN- The patient is well appearing, alert and oriented x 3 today.   HEENT: normocephalic, atraumatic; sclera clear, conjunctiva pink; hearing intact; oropharynx clear; neck supple, no JVP Lymph- no cervical lymphadenopathy Lungs- Clear to ausculation bilaterally, normal work of breathing.  No wheezes, rales, rhonchi Heart- Regular rate and rhythm, no murmurs, rubs or gallops, PMI not laterally displaced GI- soft, non-tender, non-distended, bowel sounds present, no hepatosplenomegaly Extremities- no clubbing, cyanosis, or edema; DP/PT/radial pulses 2+ bilaterally MS- no significant deformity or atrophy Skin- warm and dry, no rash or lesion Psych- euthymic mood, full affect Neuro- strength and sensation are intact  EKG is ordered. Personal review of EKG from today shows sinus bradycardia at 49 bpm with stable intervals (chronic)  Additional studies reviewed include: Previous EP office notes.   Assessment and Plan:  1. Paroxysmal atrial fibrillation Doing well overall No change to appropriately dosed eliquis for CHA2DS2VASC of at least 6  Continue labetalol (previously changed from lopressor) Stop metoprolol as dual BB with bradycardia. Continue propanolol prn only for breakthrough AF     2. Sinus bradycardia Stable on EKG as above.  Should keep on only 1 BB   3. HTN Very labile Increase amlodipine to 5 mg in setting of decreasing lopressor.  Would manage conservatively with risks of falls on Eliquis if were to drop suddenly.    4. Motor Alzheimer's dementia On Aricept  and doing well overall followed by Neurology No change  Follow up with Dr. Caryl Comes in 3 months for his recommendations.    Shirley Friar, PA-C  03/22/21 11:23 AM

## 2021-03-23 ENCOUNTER — Encounter: Payer: Self-pay | Admitting: Student

## 2021-03-23 ENCOUNTER — Other Ambulatory Visit: Payer: Self-pay

## 2021-03-23 ENCOUNTER — Ambulatory Visit (INDEPENDENT_AMBULATORY_CARE_PROVIDER_SITE_OTHER): Payer: Medicare Other | Admitting: Student

## 2021-03-23 VITALS — BP 140/62 | HR 48 | Ht 62.0 in | Wt 162.0 lb

## 2021-03-23 DIAGNOSIS — R001 Bradycardia, unspecified: Secondary | ICD-10-CM

## 2021-03-23 DIAGNOSIS — I10 Essential (primary) hypertension: Secondary | ICD-10-CM

## 2021-03-23 DIAGNOSIS — I4891 Unspecified atrial fibrillation: Secondary | ICD-10-CM | POA: Diagnosis not present

## 2021-03-23 MED ORDER — AMLODIPINE BESYLATE 5 MG PO TABS: 5.0000 mg | ORAL_TABLET | Freq: Every day | ORAL | 3 refills | Status: DC

## 2021-03-23 NOTE — Patient Instructions (Signed)
Medication Instructions:  Your physician has recommended you make the following change in your medication:   DISCONTINUE: Metoprolol INCREASE: Amlodipine to 5mg  daily  *If you need a refill on your cardiac medications before your next appointment, please call your pharmacy*   Labwork None If you have labs (blood work) drawn today and your tests are completely normal, you will receive your results only by: [facility] (if you have MyChart) OR A paper copy in the mail If you have any lab test that is abnormal or we need to change your treatment, we will call you to review the results.   Follow-Up: At [facility], you and your health needs are our priority.  As part of our continuing mission to provide you with exceptional heart care, we have created designated Provider Care Teams.  These Care Teams include your primary Cardiologist (physician) and Advanced Practice Providers (APPs -  Physician Assistants and Nurse Practitioners) who all work together to provide you with the care you need, when you need it.  Your next appointment:   2-3 month(s)  The format for your next appointment:   In Person  Provider:   You may see Virl Axe, MD or one of the following Advanced Practice Providers on your designated Care Team:   Tommye Standard, [place] [facility]" [place], [place]

## 2021-04-10 DIAGNOSIS — Z79899 Other long term (current) drug therapy: Secondary | ICD-10-CM | POA: Diagnosis not present

## 2021-04-10 DIAGNOSIS — N390 Urinary tract infection, site not specified: Secondary | ICD-10-CM | POA: Diagnosis not present

## 2021-04-10 DIAGNOSIS — I482 Chronic atrial fibrillation, unspecified: Secondary | ICD-10-CM | POA: Diagnosis not present

## 2021-04-10 DIAGNOSIS — Z7901 Long term (current) use of anticoagulants: Secondary | ICD-10-CM | POA: Diagnosis not present

## 2021-04-10 DIAGNOSIS — I499 Cardiac arrhythmia, unspecified: Secondary | ICD-10-CM | POA: Diagnosis not present

## 2021-04-10 DIAGNOSIS — F0390 Unspecified dementia without behavioral disturbance: Secondary | ICD-10-CM | POA: Diagnosis not present

## 2021-04-10 DIAGNOSIS — Z743 Need for continuous supervision: Secondary | ICD-10-CM | POA: Diagnosis not present

## 2021-04-10 DIAGNOSIS — J441 Chronic obstructive pulmonary disease with (acute) exacerbation: Secondary | ICD-10-CM | POA: Diagnosis not present

## 2021-04-10 DIAGNOSIS — R0600 Dyspnea, unspecified: Secondary | ICD-10-CM | POA: Diagnosis not present

## 2021-04-10 DIAGNOSIS — I959 Hypotension, unspecified: Secondary | ICD-10-CM | POA: Diagnosis not present

## 2021-04-10 DIAGNOSIS — U071 COVID-19: Secondary | ICD-10-CM | POA: Diagnosis not present

## 2021-04-10 DIAGNOSIS — R062 Wheezing: Secondary | ICD-10-CM | POA: Diagnosis not present

## 2021-04-10 DIAGNOSIS — J45901 Unspecified asthma with (acute) exacerbation: Secondary | ICD-10-CM | POA: Diagnosis not present

## 2021-04-10 DIAGNOSIS — I10 Essential (primary) hypertension: Secondary | ICD-10-CM | POA: Diagnosis not present

## 2021-04-11 DIAGNOSIS — I10 Essential (primary) hypertension: Secondary | ICD-10-CM | POA: Diagnosis not present

## 2021-04-11 DIAGNOSIS — J441 Chronic obstructive pulmonary disease with (acute) exacerbation: Secondary | ICD-10-CM | POA: Diagnosis not present

## 2021-04-11 DIAGNOSIS — U071 COVID-19: Secondary | ICD-10-CM | POA: Diagnosis not present

## 2021-04-11 DIAGNOSIS — N390 Urinary tract infection, site not specified: Secondary | ICD-10-CM | POA: Diagnosis not present

## 2021-04-20 ENCOUNTER — Other Ambulatory Visit: Payer: Self-pay | Admitting: Neurology

## 2021-04-20 ENCOUNTER — Other Ambulatory Visit: Payer: Self-pay | Admitting: Internal Medicine

## 2021-05-23 ENCOUNTER — Ambulatory Visit (INDEPENDENT_AMBULATORY_CARE_PROVIDER_SITE_OTHER): Payer: Medicare Other | Admitting: Internal Medicine

## 2021-05-23 ENCOUNTER — Encounter: Payer: Self-pay | Admitting: Internal Medicine

## 2021-05-23 ENCOUNTER — Other Ambulatory Visit: Payer: Self-pay

## 2021-05-23 VITALS — BP 130/62 | HR 50 | Ht 62.0 in | Wt 160.8 lb

## 2021-05-23 DIAGNOSIS — I480 Paroxysmal atrial fibrillation: Secondary | ICD-10-CM

## 2021-05-23 DIAGNOSIS — R001 Bradycardia, unspecified: Secondary | ICD-10-CM | POA: Diagnosis not present

## 2021-05-23 DIAGNOSIS — D649 Anemia, unspecified: Secondary | ICD-10-CM

## 2021-05-23 DIAGNOSIS — Z79899 Other long term (current) drug therapy: Secondary | ICD-10-CM

## 2021-05-23 LAB — BASIC METABOLIC PANEL
BUN/Creatinine Ratio: 17 (ref 12–28)
BUN: 19 mg/dL (ref 8–27)
CO2: 24 mmol/L (ref 20–29)
Calcium: 9.1 mg/dL (ref 8.7–10.3)
Chloride: 98 mmol/L (ref 96–106)
Creatinine, Ser: 1.12 mg/dL — ABNORMAL HIGH (ref 0.57–1.00)
Glucose: 92 mg/dL (ref 70–99)
Potassium: 5 mmol/L (ref 3.5–5.2)
Sodium: 136 mmol/L (ref 134–144)
eGFR: 48 mL/min/{1.73_m2} — ABNORMAL LOW (ref >59)

## 2021-05-23 LAB — CBC
Hematocrit: 35 % (ref 34.0–46.6)
Hemoglobin: 11.9 g/dL (ref 11.1–15.9)
MCH: 31.2 pg (ref 26.6–33.0)
MCHC: 34 g/dL (ref 31.5–35.7)
MCV: 92 fL (ref 79–97)
Platelets: 436 10*3/uL (ref 150–450)
RBC: 3.81 x10E6/uL (ref 3.77–5.28)
RDW: 12.8 % (ref 11.7–15.4)
WBC: 8.1 10*3/uL (ref 3.4–10.8)

## 2021-05-23 NOTE — Patient Instructions (Signed)
Medication Instructions:  Your physician recommends that you continue on your current medications as directed. Please refer to the Current Medication list given to you today.  *If you need a refill on your cardiac medications before your next appointment, please call your pharmacy*   Lab Work: CBC and BMET  If you have labs (blood work) drawn today and your tests are completely normal, you will receive your results only by: [facility] (if you have MyChart) OR A paper copy in the mail If you have any lab test that is abnormal or we need to change your treatment, we will call you to review the results.   Testing/Procedures: None ordered.    Follow-Up: At [facility], you and your health needs are our priority.  As part of our continuing mission to provide you with exceptional heart care, we have created designated Provider Care Teams.  These Care Teams include your primary Cardiologist (physician) and Advanced Practice Providers (APPs -  Physician Assistants and Nurse Practitioners) who all work together to provide you with the care you need, when you need it.  We recommend signing up for the patient portal called "MyChart".  Sign up information is provided on this After Visit Summary.  MyChart is used to connect with patients for Virtual Visits (Telemedicine).  Patients are able to view lab/test results, encounter notes, upcoming appointments, etc.  Non-urgent messages can be sent to your provider as well.   To learn more about what you can do with MyChart, go to [email or web address].    Your next appointment:   6 months with Dr Caryl Comes

## 2021-05-23 NOTE — Progress Notes (Signed)
Okay could remember      Patient Care Team: Kelly Hipps, MD as PCP - General (Family Medicine) Kelly Sprang, MD as PCP - Electrophysiology (Cardiology) Kelly Berthold, DO as Consulting Physician (Neurology)   HPI  Kelly Boone is a 88 y.o. female Seen in followup for atrial fibrillation; she is on apixoban  losartan and Mag and aldactone.  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  Her atrial fibrillation is paroxysmal; she has some baseline sinus bradycardia which recently prompted decrease in her BB    according to her daughter she is doing some better.  And heart rates at home are in the 50s mostly   The patient denies chest pain, shortness of breath, nocturnal dyspnea, orthopnea or peripheral edema .  There have been no palpitations, lightheadedness or syncope.  She is not very active.  Her dementia is worse.  Her daughter says she will not remember today's conversations tomorrow.   . She does get out however. Her husband died 2016/01/18 [name] been married 18 years and have dated for 40 years before then, beginning when she was 32 and he was 31.    Date Cr K Hgb  11/17 0.88 4.6 12.5   12/21 0.95 4.9 10.7       Past Medical History:  Diagnosis Date   A-fib ([place])    irregular heartbeat   Allergic rhinitis    Anxiety    Arthritis    Asthma    Atopic dermatitis    Benign essential hypertension    Chronic kidney disease    hx of kidney stones as teenager   Depression    Disorders of magnesium metabolism    DM (diabetes mellitus) ([place]) 2004   Edema    Encounter for long-term (current) use of other medications    Esophagitis    GERD (gastroesophageal reflux disease)    Headache    Hearing loss    Hypercalcemia    Hyperlipemia 1997   Hypertension 1997   Hyposmolality  and/or hyponatremia    Myalgia and myositis, unspecified    Myocardial infarction [facility])    June 2015, was told caused by stress   Need for prophylactic vaccination and inoculation against influenza    Osteoporosis, unspecified    Other screening mammogram    Other syndromes affecting cervical region    Pain in joint, lower leg    Pain in joint, shoulder region    Peripheral autonomic neuropathy in disorders classified elsewhere(337.1)    Pneumonia    as infant   Restless legs syndrome (RLS)    Screening for depression    Sinus bradycardia    Special screening for malignant neoplasms, colon    Type 2 diabetes mellitus with autonomic neuropathy ([place])    Unspecified essential hypertension    Unspecified vitamin D deficiency    Urinary tract infection, site not specified    Varicose veins of lower extremities with inflammation     Past Surgical History:  Procedure Laterality Date   ABDOMINAL HYSTERECTOMY     APPENDECTOMY     benign breast biopsy     CHOLECYSTECTOMY     LEFT HEART CATHETERIZATION WITH CORONARY ANGIOGRAM N/A 09/30/2013   Procedure: LEFT HEART CATHETERIZATION WITH CORONARY ANGIOGRAM;  Surgeon: Troy Sine, MD;  Location: [facility] CATH LAB;  Service: Cardiovascular;  Laterality: N/A;   OTHER SURGICAL HISTORY     Nuclear Stress Test with EF 70%, Chest Radiograph-lungs are clear without pleural effusions or focal consolidations. Trachea projects midline and there is no pneumothorax. Suture anchors in right humeral head.   SHOULDER SURGERY     Repair   TONSILLECTOMY     TOTAL KNEE ARTHROPLASTY Left 10/14/2014   Procedure: LEFT TOTAL KNEE ARTHROPLASTY;  Surgeon: Latanya Maudlin, MD;  Location: [facility] ORS;  Service: Orthopedics;  Laterality: Left;    Current Outpatient Medications  Medication Sig Dispense Refill   albuterol (PROVENTIL HFA;VENTOLIN HFA) 108 (90 BASE) MCG/ACT inhaler Inhale 2 puffs into the lungs every 6 (six) hours as needed for wheezing or shortness of breath.      amLODipine (NORVASC) 2.5 MG tablet Take 2.5 mg by mouth daily.     apixaban (ELIQUIS) 5 MG TABS tablet Take 1 tablet by mouth twice daily 180 tablet 1   BIOTIN PO Take 5,000 mg by mouth daily.      Cholecalciferol (VITAMIN D PO) Take 1,000 Units by mouth daily.     citalopram (CELEXA) 10 MG tablet Take 1 tablet by mouth daily.  3   cloNIDine (CATAPRES) 0.2 MG tablet Take 0.2 mg by mouth as needed (For BP above 160).     cyanocobalamin 500 MCG tablet Take 1,000 mcg by mouth daily.      diazepam (VALIUM) 5 MG tablet Take 5 mg by mouth as needed.     donepezil (ARICEPT) 10 MG tablet Take 1 tablet by mouth once daily 90 tablet 0   furosemide (LASIX) 20 MG tablet TAKE ONE TABLET BY MOUTH ONCE DAILY AS NEEDED FOR  EDEMA 30 tablet 10   labetalol (NORMODYNE) 200 MG tablet Take 1 tablet by mouth twice daily 180 tablet 3   losartan (COZAAR) 50 MG tablet Take 1 tablet by mouth daily.  3   Magnesium 250 MG TABS Take 2 tablets by mouth every morning.      pramipexole (MIRAPEX) 1 MG tablet Take 1 tablet (1 mg total) by mouth at bedtime. 90 tablet 3   propranolol (INDERAL) 10 MG tablet Take 1 tablet (10 mg total) by mouth as needed. 90 tablet 3   amLODipine (NORVASC) 5 MG tablet Take 1 tablet (5 mg total) by mouth daily. (Patient not taking: Reported on 05/23/2021) 90 tablet 3   Current Facility-Administered Medications  Medication Dose Route Frequency Provider Last Rate Last Admin   propranolol (INDERAL) tablet 10 mg  10 mg Oral Q6H PRN Kelly Sprang, MD        Allergies  Allergen Reactions   Daypro [Oxaprozin] Other (See Comments)    Palpitation   Statins Other (See Comments)    Myalgias  Patient denies this allergy at preop visit 10-08-14   Zetia [Ezetimibe] Other (See Comments)    Joint Pain, Swelling    Review of Systems negative except from HPI and PMH  Physical Exam BP 130/62    Pulse (!) 50    Ht 5\' 2"  (1.575 m)    Wt 160 lb 12.8 oz (72.9 kg)    SpO2 97%    BMI 29.41 kg/m  Well  developed and nourished in no acute distress HENT normal Neck supple  Clear Regular rate and rhythm, no murmurs or gallops Abd-soft with active BS No Clubbing cyanosis edema Skin-warm and dry A & Oriented  Grossly normal sensory and motor function  ECG sinus at 50 Intervals 18/09/44  Assessment and  Plan  Atrial fibrillation-paroxysmal   Sinus bradycardia  Hypertension-   End-of-life discussion    Atrial fibrillation not seemingly problematic.  Continue Eliquis at 5 mg twice daily.  Blood pressure well controlled on labetalol 200 twice daily and losartan 50 daily; will continue Continue propranolol 10 as needed for tachypalpitations  Lengthy discussion with the daughter regarding palliative care support.  She is care not only for her mom but also for her aunt.  It has been exceedingly trying.  Encouraged her to reach out to her mother's primary care physician for palliative care support.  I have also reached out to palliative care [facility] to see if I can make that referral on behalf of the family.  We also broached DNR status.  Not withstanding the patient's dementia, she was adamant that if her heart stops she wanted to be let go.  We have written a DNR.

## 2021-06-13 ENCOUNTER — Other Ambulatory Visit: Payer: Self-pay

## 2021-06-13 MED ORDER — AMLODIPINE BESYLATE 5 MG PO TABS: 5.0000 mg | ORAL_TABLET | Freq: Every day | ORAL | 3 refills | Status: AC

## 2021-06-27 ENCOUNTER — Other Ambulatory Visit: Payer: Self-pay

## 2021-06-27 DIAGNOSIS — I4891 Unspecified atrial fibrillation: Secondary | ICD-10-CM

## 2021-06-27 DIAGNOSIS — Z23 Encounter for immunization: Secondary | ICD-10-CM | POA: Diagnosis not present

## 2021-06-27 MED ORDER — APIXABAN 5 MG PO TABS: 5.0000 mg | ORAL_TABLET | Freq: Two times a day (BID) | ORAL | 3 refills | Status: AC

## 2021-06-27 NOTE — Telephone Encounter (Signed)
Prescription refill request for Eliquis received. ?Indication: Afib  ?Last office visit: 05/23/21 Caryl Comes)  ?Scr: 1.12 (05/23/21) ?Age: 88 ?Weight: 72.9kg ? ?Appropriate dose and refill sent to requested pharmacy.  ?

## 2021-07-15 DIAGNOSIS — J029 Acute pharyngitis, unspecified: Secondary | ICD-10-CM | POA: Diagnosis not present

## 2021-08-19 DIAGNOSIS — L089 Local infection of the skin and subcutaneous tissue, unspecified: Secondary | ICD-10-CM | POA: Diagnosis not present

## 2021-08-19 DIAGNOSIS — R202 Paresthesia of skin: Secondary | ICD-10-CM | POA: Diagnosis not present

## 2021-08-19 DIAGNOSIS — S61412A Laceration without foreign body of left hand, initial encounter: Secondary | ICD-10-CM | POA: Diagnosis not present

## 2021-10-27 DIAGNOSIS — M25561 Pain in right knee: Secondary | ICD-10-CM | POA: Diagnosis not present

## 2021-10-27 DIAGNOSIS — M25511 Pain in right shoulder: Secondary | ICD-10-CM | POA: Diagnosis not present

## 2021-11-23 DIAGNOSIS — J029 Acute pharyngitis, unspecified: Secondary | ICD-10-CM | POA: Diagnosis not present

## 2021-11-23 DIAGNOSIS — R519 Headache, unspecified: Secondary | ICD-10-CM | POA: Diagnosis not present

## 2022-01-03 DIAGNOSIS — Z23 Encounter for immunization: Secondary | ICD-10-CM | POA: Diagnosis not present

## 2022-02-15 DIAGNOSIS — M5431 Sciatica, right side: Secondary | ICD-10-CM | POA: Diagnosis not present

## 2022-02-16 ENCOUNTER — Other Ambulatory Visit: Payer: Self-pay

## 2022-02-16 ENCOUNTER — Emergency Department (HOSPITAL_COMMUNITY)
Admission: EM | Admit: 2022-02-16 | Discharge: 2022-02-16 | Disposition: A | Discharge status: Home / Self Care | Payer: Medicare Other | Attending: Emergency Medicine | Admitting: Emergency Medicine

## 2022-02-16 ENCOUNTER — Emergency Department (HOSPITAL_COMMUNITY): Payer: Medicare Other

## 2022-02-16 DIAGNOSIS — M48061 Spinal stenosis, lumbar region without neurogenic claudication: Secondary | ICD-10-CM | POA: Diagnosis not present

## 2022-02-16 DIAGNOSIS — I700 Atherosclerosis of aorta: Secondary | ICD-10-CM | POA: Insufficient documentation

## 2022-02-16 DIAGNOSIS — W19XXXA Unspecified fall, initial encounter: Secondary | ICD-10-CM | POA: Insufficient documentation

## 2022-02-16 DIAGNOSIS — Z7901 Long term (current) use of anticoagulants: Secondary | ICD-10-CM | POA: Insufficient documentation

## 2022-02-16 DIAGNOSIS — M4316 Spondylolisthesis, lumbar region: Secondary | ICD-10-CM | POA: Diagnosis not present

## 2022-02-16 DIAGNOSIS — Z79899 Other long term (current) drug therapy: Secondary | ICD-10-CM | POA: Diagnosis not present

## 2022-02-16 DIAGNOSIS — I10 Essential (primary) hypertension: Secondary | ICD-10-CM | POA: Diagnosis not present

## 2022-02-16 DIAGNOSIS — M25559 Pain in unspecified hip: Secondary | ICD-10-CM | POA: Diagnosis not present

## 2022-02-16 DIAGNOSIS — M5441 Lumbago with sciatica, right side: Secondary | ICD-10-CM | POA: Insufficient documentation

## 2022-02-16 DIAGNOSIS — Z743 Need for continuous supervision: Secondary | ICD-10-CM | POA: Diagnosis not present

## 2022-02-16 DIAGNOSIS — M545 Low back pain, unspecified: Secondary | ICD-10-CM | POA: Diagnosis present

## 2022-02-16 NOTE — Discharge Instructions (Signed)
You were seen today for low back pain with symptoms going down the right leg.  Your pain is consistent with sciatica or radiculopathy which is an irritation of the nerve coming from your lower back.  You may continue to take the methocarbamol previously prescribed but I recommend only taking 500 maximum once a day.  Too much methocarbamol can make you drowsy and can lead to falls.  You may also take Tylenol as needed for breakthrough pain.  Please follow-up with orthopedics.  You also need follow-up with orthopedics/neurosurgery due to a CT finding.  Your CT exam showed a predominantly calcified mass within the right lateral recess at L1-L2 measuring 10 x 10 mm, severely narrowing the spinal canal.  This is favored to be a calcified meningioma.  MRI with and without contrast recommended.  Please discuss this at your upcoming orthopedic appointment.  I have listed the on-call neurosurgery provider for follow-up if unable to arrange through your current orthopedic practice.

## 2022-02-16 NOTE — ED Triage Notes (Signed)
Pt to ED via EMS from home. Pt fell 2 weeks ago. States she slid herself down. She c/o R hip pain that radiates down her R leg. She went to urgent care after fall and they placed her on muscle relaxer but did not do any xrays. Pt last took muscle relaxer at 1330.  EMS reports   BP160/80 HR 72 RR 14 O2 93% RA

## 2022-02-16 NOTE — ED Provider Notes (Signed)
[facility] DEPT Provider Note   CSN: [id number] Arrival date & time: 02/16/22  1701     History  Chief Complaint  Patient presents with   Back Pain    Kelly Boone is a 88 y.o. female.  Patient presents emergency department via ambulance complaining of low back pain with symptoms that radiate down the right leg.  Patient reportedly fell approximately 2 weeks ago.  She states that she began to fall and was able to slide down the wall.  She states that she has had pain since that time with symptoms radiating down the right leg from the hip to the calf region.  She was seen yesterday in urgent care and was prescribed methocarbamol, 1000 mg, twice daily.  Family at bedside states that they saw her this morning and she was very lethargic after taking the methocarbamol.  She is able to bear weight on the right lower extremity without difficulty.  Past medical history significant for atrial fibrillation on Eliquis, hypertension, sinus bradycardia.  Patient is followed by orthopedics for joint issues  HPI     Home Medications Prior to Admission medications   Medication Sig Start Date End Date Taking? Authorizing Provider  albuterol (PROVENTIL HFA;VENTOLIN HFA) 108 (90 BASE) MCG/ACT inhaler Inhale 2 puffs into the lungs every 6 (six) hours as needed for wheezing or shortness of breath.    [provider]  amLODipine (NORVASC) 5 MG tablet Take 1 tablet (5 mg total) by mouth daily. 06/13/21   Deboraha Sprang, MD  apixaban (ELIQUIS) 5 MG TABS tablet Take 1 tablet (5 mg total) by mouth 2 (two) times daily. 06/27/21   Deboraha Sprang, MD  BIOTIN PO Take 5,000 mg by mouth daily.     [provider]  Cholecalciferol (VITAMIN D PO) Take 1,000 Units by mouth daily.    [provider]  citalopram (CELEXA) 10 MG tablet Take 1 tablet by mouth daily. 12/08/17   [provider]  cloNIDine (CATAPRES) 0.2 MG tablet Take 0.2 mg by mouth as needed  (For BP above 160).    [provider]  cyanocobalamin 500 MCG tablet Take 1,000 mcg by mouth daily.     [provider]  diazepam (VALIUM) 5 MG tablet Take 5 mg by mouth as needed. 01/28/20   [provider]  donepezil (ARICEPT) 10 MG tablet Take 1 tablet by mouth once daily 04/20/21   Narda Amber K, DO  furosemide (LASIX) 20 MG tablet TAKE ONE TABLET BY MOUTH ONCE DAILY AS NEEDED FOR  EDEMA 03/18/15   Deboraha Sprang, MD  labetalol (NORMODYNE) 200 MG tablet Take 1 tablet by mouth twice daily 04/20/21   Deboraha Sprang, MD  losartan (COZAAR) 50 MG tablet Take 1 tablet by mouth daily. 01/23/18   [provider]  Magnesium 250 MG TABS Take 2 tablets by mouth every morning.     [provider]  pramipexole (MIRAPEX) 1 MG tablet Take 1 tablet (1 mg total) by mouth at bedtime. 03/04/21   Narda Amber K, DO  propranolol (INDERAL) 10 MG tablet Take 1 tablet (10 mg total) by mouth as needed. 03/12/19   Shirley Friar, PA-C      Allergies    Daypro [oxaprozin], Statins, and Zetia [ezetimibe]    Review of Systems   Review of Systems  Musculoskeletal:  Positive for arthralgias and back pain.  Neurological:  Negative for syncope, weakness and headaches.    Physical Exam  Updated Vital Signs BP (!) 147/54   Pulse 73   Temp 98.3 F (36.8 C)   Resp 16   Ht '5\' 2"'$  (1.575 m)   Wt 70.3 kg   SpO2 (!) 89%   BMI 28.35 kg/m  Physical Exam Vitals and nursing note reviewed.  Constitutional:      General: She is not in acute distress.    Appearance: She is well-developed.  HENT:     Head: Normocephalic and atraumatic.  Eyes:     Conjunctiva/sclera: Conjunctivae normal.  Cardiovascular:     Rate and Rhythm: Normal rate and regular rhythm.     Heart sounds: No murmur heard. Pulmonary:     Effort: Pulmonary effort is normal. No respiratory distress.     Breath sounds: Normal breath sounds.  Abdominal:     Palpations: Abdomen is soft.      Tenderness: There is no abdominal tenderness.  Musculoskeletal:        General: Tenderness present. No swelling, deformity or signs of injury. Normal range of motion.     Cervical back: Neck supple.     Comments: Mild tenderness to palpation of the lower back on the right-hand side  Skin:    General: Skin is warm and dry.     Capillary Refill: Capillary refill takes less than 2 seconds.  Neurological:     Mental Status: She is alert.  Psychiatric:        Mood and Affect: Mood normal.     ED Results / Procedures / Treatments   Labs (all labs ordered are listed, but only abnormal results are displayed) Labs Reviewed - No data to display  EKG None  Radiology CT Lumbar Spine Wo Contrast  Result Date: 02/16/2022 CLINICAL DATA:  Fall EXAM: CT LUMBAR SPINE WITHOUT CONTRAST TECHNIQUE: Multidetector CT imaging of the lumbar spine was performed without intravenous contrast administration. Multiplanar CT image reconstructions were also generated. RADIATION DOSE REDUCTION: This exam was performed according to the departmental dose-optimization program which includes automated exposure control, adjustment of the mA and/or kV according to patient size and/or use of iterative reconstruction technique. COMPARISON:  CT abdomen pelvis 12/08/2014 FINDINGS: Segmentation: 5 lumbar type vertebrae. Alignment: Grade 1 anterolisthesis at L4-5 Vertebrae: No acute fracture or focal pathologic process. There is mild height loss at L5. Paraspinal and other soft tissues: Calcific aortic atherosclerosis. Disc levels: L1-2: There is a predominantly calcified mass within the right lateral recess measuring 10 x 10 mm severely narrowing the spinal canal. L2-3: Unremarkable L3-4: Left asymmetric disc bulge with mild spinal canal stenosis. Ligament flavum redundancy. L4-5: Moderate facet hypertrophy with grade 1 anterolisthesis. Severe spinal stenosis. L5-S1 small disc bulge. Moderate facet hypertrophy. No spinal stenosis.  IMPRESSION: 1. No acute fracture of the lumbar spine. 2. Predominantly calcified mass within the right lateral recess at L1-L2, measuring 10 x 10 mm, severely narrowing the spinal canal. This is favored to be a calcified meningioma. MRI with and without contrast recommended. 3. Severe spinal stenosis at L4-5. Aortic Atherosclerosis (ICD10-I70.0). Electronically Signed   By: Ulyses Jarred M.D.   On: 02/16/2022 19:33    Procedures Procedures    Medications Ordered in ED Medications - No data to display  ED Course/ Medical Decision Making/ A&P                           Medical Decision Making Amount and/or Complexity of Data Reviewed Radiology: ordered.   This patient presents  to the ED for concern of back pain, this involves an extensive number of treatment options, and is a complaint that carries with it a high risk of complications and morbidity.  The differential diagnosis includes degenerative disc disease with lumbar radiculopathy, fracture, dislocation, and others   Co morbidities that complicate the patient evaluation  History arthritis   Additional history obtained:  Additional history obtained from family bedside   Imaging Studies ordered:  I ordered imaging studies including CT lumbar spine  I independently visualized and interpreted imaging which showed  1. No acute fracture of the lumbar spine.  2. Predominantly calcified mass within the right lateral recess at  L1-L2, measuring 10 x 10 mm, severely narrowing the spinal canal.  This is favored to be a calcified meningioma. MRI with and without  contrast recommended.  3. Severe spinal stenosis at L4-5.    Aortic Atherosclerosis   I agree with the radiologist interpretation   Test / Admission - Considered:  The patient's symptoms seem to be radiculopathy due to to the likely stenosis at the L4-L5 level.  There were findings on CT concerning for a possible calcified meningioma but this does not seem to be  contributing to the patient's issues at this time.  Patient was able to ambulate without difficulty.  Plan to have patient discharged home and follow-up with neurosurgery for outpatient MRI and follow-up.  Patient given return precautions including cauda equina symptoms.        Final Clinical Impression(s) / ED Diagnoses Final diagnoses:  Acute right-sided low back pain with right-sided sciatica    Rx / DC Orders ED Discharge Orders     None         Ronny Bacon 02/16/22 1953    Wyvonnia Dusky, MD 02/16/22 2233

## 2022-02-21 DIAGNOSIS — Z6828 Body mass index (BMI) 28.0-28.9, adult: Secondary | ICD-10-CM | POA: Diagnosis not present

## 2022-02-21 DIAGNOSIS — Z79899 Other long term (current) drug therapy: Secondary | ICD-10-CM | POA: Diagnosis not present

## 2022-02-21 DIAGNOSIS — E7800 Pure hypercholesterolemia, unspecified: Secondary | ICD-10-CM | POA: Diagnosis not present

## 2022-02-21 DIAGNOSIS — E1169 Type 2 diabetes mellitus with other specified complication: Secondary | ICD-10-CM | POA: Diagnosis not present

## 2022-02-21 DIAGNOSIS — Z0000 Encounter for general adult medical examination without abnormal findings: Secondary | ICD-10-CM | POA: Diagnosis not present

## 2022-02-22 DIAGNOSIS — I10 Essential (primary) hypertension: Secondary | ICD-10-CM | POA: Diagnosis not present

## 2022-02-22 DIAGNOSIS — Z9849 Cataract extraction status, unspecified eye: Secondary | ICD-10-CM | POA: Diagnosis not present

## 2022-02-22 DIAGNOSIS — E1169 Type 2 diabetes mellitus with other specified complication: Secondary | ICD-10-CM | POA: Diagnosis not present

## 2022-02-22 DIAGNOSIS — G2581 Restless legs syndrome: Secondary | ICD-10-CM | POA: Diagnosis not present

## 2022-02-22 DIAGNOSIS — Z9049 Acquired absence of other specified parts of digestive tract: Secondary | ICD-10-CM | POA: Diagnosis not present

## 2022-02-22 DIAGNOSIS — R413 Other amnesia: Secondary | ICD-10-CM | POA: Diagnosis not present

## 2022-02-22 DIAGNOSIS — Z7901 Long term (current) use of anticoagulants: Secondary | ICD-10-CM | POA: Diagnosis not present

## 2022-02-22 DIAGNOSIS — Z9089 Acquired absence of other organs: Secondary | ICD-10-CM | POA: Diagnosis not present

## 2022-02-22 DIAGNOSIS — E7800 Pure hypercholesterolemia, unspecified: Secondary | ICD-10-CM | POA: Diagnosis not present

## 2022-02-22 DIAGNOSIS — F418 Other specified anxiety disorders: Secondary | ICD-10-CM | POA: Diagnosis not present

## 2022-02-22 DIAGNOSIS — M5135 Other intervertebral disc degeneration, thoracolumbar region: Secondary | ICD-10-CM | POA: Diagnosis not present

## 2022-03-02 DIAGNOSIS — M5135 Other intervertebral disc degeneration, thoracolumbar region: Secondary | ICD-10-CM | POA: Diagnosis not present

## 2022-03-02 DIAGNOSIS — I10 Essential (primary) hypertension: Secondary | ICD-10-CM | POA: Diagnosis not present

## 2022-03-02 DIAGNOSIS — G2581 Restless legs syndrome: Secondary | ICD-10-CM | POA: Diagnosis not present

## 2022-03-02 DIAGNOSIS — F418 Other specified anxiety disorders: Secondary | ICD-10-CM | POA: Diagnosis not present

## 2022-03-02 DIAGNOSIS — E7800 Pure hypercholesterolemia, unspecified: Secondary | ICD-10-CM | POA: Diagnosis not present

## 2022-03-02 DIAGNOSIS — E1169 Type 2 diabetes mellitus with other specified complication: Secondary | ICD-10-CM | POA: Diagnosis not present

## 2022-03-06 ENCOUNTER — Ambulatory Visit: Payer: Medicare Other | Admitting: Neurology

## 2022-03-07 DIAGNOSIS — F418 Other specified anxiety disorders: Secondary | ICD-10-CM | POA: Diagnosis not present

## 2022-03-07 DIAGNOSIS — E1169 Type 2 diabetes mellitus with other specified complication: Secondary | ICD-10-CM | POA: Diagnosis not present

## 2022-03-07 DIAGNOSIS — I10 Essential (primary) hypertension: Secondary | ICD-10-CM | POA: Diagnosis not present

## 2022-03-07 DIAGNOSIS — M5135 Other intervertebral disc degeneration, thoracolumbar region: Secondary | ICD-10-CM | POA: Diagnosis not present

## 2022-03-07 DIAGNOSIS — E7800 Pure hypercholesterolemia, unspecified: Secondary | ICD-10-CM | POA: Diagnosis not present

## 2022-03-07 DIAGNOSIS — G2581 Restless legs syndrome: Secondary | ICD-10-CM | POA: Diagnosis not present

## 2022-03-08 DIAGNOSIS — E7800 Pure hypercholesterolemia, unspecified: Secondary | ICD-10-CM | POA: Diagnosis not present

## 2022-03-08 DIAGNOSIS — M5135 Other intervertebral disc degeneration, thoracolumbar region: Secondary | ICD-10-CM | POA: Diagnosis not present

## 2022-03-08 DIAGNOSIS — I10 Essential (primary) hypertension: Secondary | ICD-10-CM | POA: Diagnosis not present

## 2022-03-08 DIAGNOSIS — G2581 Restless legs syndrome: Secondary | ICD-10-CM | POA: Diagnosis not present

## 2022-03-08 DIAGNOSIS — E1169 Type 2 diabetes mellitus with other specified complication: Secondary | ICD-10-CM | POA: Diagnosis not present

## 2022-03-08 DIAGNOSIS — F418 Other specified anxiety disorders: Secondary | ICD-10-CM | POA: Diagnosis not present

## 2022-03-10 DIAGNOSIS — M5135 Other intervertebral disc degeneration, thoracolumbar region: Secondary | ICD-10-CM | POA: Diagnosis not present

## 2022-03-10 DIAGNOSIS — E1169 Type 2 diabetes mellitus with other specified complication: Secondary | ICD-10-CM | POA: Diagnosis not present

## 2022-03-10 DIAGNOSIS — E7800 Pure hypercholesterolemia, unspecified: Secondary | ICD-10-CM | POA: Diagnosis not present

## 2022-03-14 DIAGNOSIS — E7800 Pure hypercholesterolemia, unspecified: Secondary | ICD-10-CM | POA: Diagnosis not present

## 2022-03-14 DIAGNOSIS — F418 Other specified anxiety disorders: Secondary | ICD-10-CM | POA: Diagnosis not present

## 2022-03-14 DIAGNOSIS — M5135 Other intervertebral disc degeneration, thoracolumbar region: Secondary | ICD-10-CM | POA: Diagnosis not present

## 2022-03-14 DIAGNOSIS — G2581 Restless legs syndrome: Secondary | ICD-10-CM | POA: Diagnosis not present

## 2022-03-14 DIAGNOSIS — E1169 Type 2 diabetes mellitus with other specified complication: Secondary | ICD-10-CM | POA: Diagnosis not present

## 2022-03-14 DIAGNOSIS — I10 Essential (primary) hypertension: Secondary | ICD-10-CM | POA: Diagnosis not present

## 2022-03-21 DIAGNOSIS — G2581 Restless legs syndrome: Secondary | ICD-10-CM | POA: Diagnosis not present

## 2022-03-21 DIAGNOSIS — F418 Other specified anxiety disorders: Secondary | ICD-10-CM | POA: Diagnosis not present

## 2022-03-21 DIAGNOSIS — E7800 Pure hypercholesterolemia, unspecified: Secondary | ICD-10-CM | POA: Diagnosis not present

## 2022-03-21 DIAGNOSIS — E1169 Type 2 diabetes mellitus with other specified complication: Secondary | ICD-10-CM | POA: Diagnosis not present

## 2022-03-21 DIAGNOSIS — I10 Essential (primary) hypertension: Secondary | ICD-10-CM | POA: Diagnosis not present

## 2022-03-21 DIAGNOSIS — M5135 Other intervertebral disc degeneration, thoracolumbar region: Secondary | ICD-10-CM | POA: Diagnosis not present

## 2022-03-23 DIAGNOSIS — M5135 Other intervertebral disc degeneration, thoracolumbar region: Secondary | ICD-10-CM | POA: Diagnosis not present

## 2022-03-23 DIAGNOSIS — I10 Essential (primary) hypertension: Secondary | ICD-10-CM | POA: Diagnosis not present

## 2022-03-23 DIAGNOSIS — G2581 Restless legs syndrome: Secondary | ICD-10-CM | POA: Diagnosis not present

## 2022-03-23 DIAGNOSIS — F418 Other specified anxiety disorders: Secondary | ICD-10-CM | POA: Diagnosis not present

## 2022-03-23 DIAGNOSIS — E7800 Pure hypercholesterolemia, unspecified: Secondary | ICD-10-CM | POA: Diagnosis not present

## 2022-03-23 DIAGNOSIS — E1169 Type 2 diabetes mellitus with other specified complication: Secondary | ICD-10-CM | POA: Diagnosis not present

## 2022-03-24 DIAGNOSIS — E1169 Type 2 diabetes mellitus with other specified complication: Secondary | ICD-10-CM | POA: Diagnosis not present

## 2022-03-24 DIAGNOSIS — I10 Essential (primary) hypertension: Secondary | ICD-10-CM | POA: Diagnosis not present

## 2022-03-24 DIAGNOSIS — F418 Other specified anxiety disorders: Secondary | ICD-10-CM | POA: Diagnosis not present

## 2022-03-24 DIAGNOSIS — E7800 Pure hypercholesterolemia, unspecified: Secondary | ICD-10-CM | POA: Diagnosis not present

## 2022-03-24 DIAGNOSIS — G2581 Restless legs syndrome: Secondary | ICD-10-CM | POA: Diagnosis not present

## 2022-03-24 DIAGNOSIS — Z9049 Acquired absence of other specified parts of digestive tract: Secondary | ICD-10-CM | POA: Diagnosis not present

## 2022-03-24 DIAGNOSIS — Z9849 Cataract extraction status, unspecified eye: Secondary | ICD-10-CM | POA: Diagnosis not present

## 2022-03-24 DIAGNOSIS — Z7901 Long term (current) use of anticoagulants: Secondary | ICD-10-CM | POA: Diagnosis not present

## 2022-03-24 DIAGNOSIS — Z9089 Acquired absence of other organs: Secondary | ICD-10-CM | POA: Diagnosis not present

## 2022-03-24 DIAGNOSIS — M5135 Other intervertebral disc degeneration, thoracolumbar region: Secondary | ICD-10-CM | POA: Diagnosis not present

## 2022-03-24 DIAGNOSIS — R413 Other amnesia: Secondary | ICD-10-CM | POA: Diagnosis not present

## 2022-03-28 ENCOUNTER — Encounter: Payer: Self-pay | Admitting: Internal Medicine

## 2022-03-28 ENCOUNTER — Ambulatory Visit: Payer: Medicare Other | Attending: Internal Medicine | Admitting: Internal Medicine

## 2022-03-28 VITALS — BP 96/52 | HR 59 | Ht 61.0 in | Wt 149.2 lb

## 2022-03-28 DIAGNOSIS — I480 Paroxysmal atrial fibrillation: Secondary | ICD-10-CM | POA: Diagnosis not present

## 2022-03-28 DIAGNOSIS — R001 Bradycardia, unspecified: Secondary | ICD-10-CM | POA: Insufficient documentation

## 2022-03-28 NOTE — Patient Instructions (Signed)
Medication Instructions:  Please take Labetalol once a day for 5 days then discontinue. Continue all other medications as listed.  *If you need a refill on your cardiac medications before your next appointment, please call your pharmacy*   Follow-Up: At [facility], you and your health needs are our priority.  As part of our continuing mission to provide you with exceptional heart care, we have created designated Provider Care Teams.  These Care Teams include your primary Cardiologist (physician) and Advanced Practice Providers (APPs -  Physician Assistants and Nurse Practitioners) who all work together to provide you with the care you need, when you need it.  We recommend signing up for the patient portal called "MyChart".  Sign up information is provided on this After Visit Summary.  MyChart is used to connect with patients for Virtual Visits (Telemedicine).  Patients are able to view lab/test results, encounter notes, upcoming appointments, etc.  Non-urgent messages can be sent to your provider as well.   To learn more about what you can do with MyChart, go to [email or web address].    Your next appointment:   1 year(s)  The format for your next appointment:   In Person  Provider:   Dr Caryl Comes     Important Information About Sugar

## 2022-03-28 NOTE — Progress Notes (Signed)
Okay could remember      Patient Care Team: Ronita Hipps, MD as PCP - General (Family Medicine) Deboraha Sprang, MD as PCP - Electrophysiology (Cardiology) Alda Berthold, DO as Consulting Physician (Neurology)   HPI  Kelly Boone is a 88 y.o. female Seen in followup for atrial fibrillation; she is on apixoban  losartan and Mag and aldactone.  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  Her atrial fibrillation is paroxysmal; she has some baseline sinus bradycardia which recently prompted decrease in her BB    according to her daughter she is doing some better.  And heart rates at home are in the 50s mostly   Increasingly less active.  Blood pressures are low.  No falls.  OT and PT in the house by way of palliative care but no other services at this point.       Date Cr K Hgb  11/17 0.88 4.6 12.5   12/21 0.95 4.9 10.7  2/23 1.12 5.0 11.9       Past Medical History:  Diagnosis Date   A-fib ([place])    irregular heartbeat   Allergic rhinitis    Anxiety    Arthritis    Asthma    Atopic dermatitis    Benign essential hypertension    Chronic kidney disease    hx of kidney stones as teenager   Depression    Disorders of magnesium metabolism    DM (diabetes mellitus) ([place]) 2004   Edema    Encounter for long-term (current) use of other medications    Esophagitis    GERD (gastroesophageal reflux disease)    Headache    Hearing loss    Hypercalcemia    Hyperlipemia 1997   Hypertension 1997   Hyposmolality and/or hyponatremia    Myalgia and myositis, unspecified    Myocardial infarction [facility])    June 2015, was told caused by stress   Need for prophylactic vaccination and inoculation against influenza    Osteoporosis, unspecified    Other screening mammogram    Other syndromes  affecting cervical region    Pain in joint, lower leg    Pain in joint, shoulder region    Peripheral autonomic neuropathy in disorders classified elsewhere(337.1)    Pneumonia    as infant   Restless legs syndrome (RLS)    Screening for depression    Sinus bradycardia    Special screening for malignant neoplasms, colon    Type 2 diabetes mellitus with autonomic neuropathy ([place])    Unspecified essential hypertension    Unspecified vitamin D deficiency    Urinary tract infection, site not specified    Varicose veins of lower extremities with inflammation     Past Surgical History:  Procedure Laterality Date   ABDOMINAL HYSTERECTOMY     APPENDECTOMY     benign breast biopsy     CHOLECYSTECTOMY     LEFT HEART CATHETERIZATION WITH CORONARY ANGIOGRAM N/A 09/30/2013   Procedure: LEFT HEART CATHETERIZATION WITH CORONARY ANGIOGRAM;  Surgeon: Troy Sine, MD;  Location: [facility] CATH LAB;  Service: Cardiovascular;  Laterality: N/A;   OTHER SURGICAL HISTORY     Nuclear Stress Test with EF 70%, Chest Radiograph-lungs are clear without pleural effusions or focal consolidations. Trachea projects midline and there is no pneumothorax. Suture anchors in right humeral head.  SHOULDER SURGERY     Repair   TONSILLECTOMY     TOTAL KNEE ARTHROPLASTY Left 10/14/2014   Procedure: LEFT TOTAL KNEE ARTHROPLASTY;  Surgeon: Latanya Maudlin, MD;  Location: [facility] ORS;  Service: Orthopedics;  Laterality: Left;    Current Outpatient Medications  Medication Sig Dispense Refill   albuterol (PROVENTIL HFA;VENTOLIN HFA) 108 (90 BASE) MCG/ACT inhaler Inhale 2 puffs into the lungs every 6 (six) hours as needed for wheezing or shortness of breath.     amLODipine (NORVASC) 5 MG tablet Take 1 tablet (5 mg total) by mouth daily. 90 tablet 3   apixaban (ELIQUIS) 5 MG TABS tablet Take 1 tablet (5 mg total) by mouth 2 (two) times daily. 180 tablet 3   BIOTIN PO Take 5,000 mg by mouth daily.      Cholecalciferol (VITAMIN D PO)  Take 1,000 Units by mouth daily.     citalopram (CELEXA) 10 MG tablet Take 1 tablet by mouth daily.  3   cloNIDine (CATAPRES) 0.2 MG tablet Take 0.2 mg by mouth as needed (For BP above 160).     cyanocobalamin 500 MCG tablet Take 1,000 mcg by mouth daily.      diazepam (VALIUM) 5 MG tablet Take 5 mg by mouth as needed.     donepezil (ARICEPT) 10 MG tablet Take 1 tablet by mouth once daily 90 tablet 0   furosemide (LASIX) 20 MG tablet TAKE ONE TABLET BY MOUTH ONCE DAILY AS NEEDED FOR  EDEMA 30 tablet 10   labetalol (NORMODYNE) 200 MG tablet Take 1 tablet by mouth twice daily 180 tablet 3   losartan (COZAAR) 50 MG tablet Take 1 tablet by mouth daily.  3   Magnesium 250 MG TABS Take 2 tablets by mouth every morning.      methocarbamol (ROBAXIN) 500 MG tablet Take 500 mg by mouth every 6 (six) hours as needed.     pramipexole (MIRAPEX) 1 MG tablet Take 1 tablet (1 mg total) by mouth at bedtime. 90 tablet 3   propranolol (INDERAL) 10 MG tablet Take 1 tablet (10 mg total) by mouth as needed. 90 tablet 3   traMADol (ULTRAM) 50 MG tablet Take 50 mg by mouth every 6 (six) hours as needed.     Current Facility-Administered Medications  Medication Dose Route Frequency Provider Last Rate Last Admin   propranolol (INDERAL) tablet 10 mg  10 mg Oral Q6H PRN Deboraha Sprang, MD        Allergies  Allergen Reactions   Oxaprozin Other (See Comments)    Palpitation   Labetalol Swelling   Statins Other (See Comments)    Myalgias  Patient denies this allergy at preop visit 10-08-14   Zetia [Ezetimibe] Other (See Comments)    Joint Pain, Swelling    Review of Systems negative except from HPI and PMH  Physical Exam BP (!) 96/52   Pulse (!) 59   Ht '5\' 1"'$  (1.549 m)   Wt 149 lb 3.2 oz (67.7 kg)   SpO2 97%   BMI 28.19 kg/m  Well developed and nourished in no acute distress HENT normal Neck supple  Clear Regular rate and rhythm, no murmurs or gallops Abd-soft with active BS No Clubbing cyanosis  edema Skin-warm and dry A & Oriented  Grossly normal sensory and motor function  ECG sinus at 50 Intervals 18/09/44   Assessment and  Plan  Atrial fibrillation-paroxysmal   Sinus bradycardia  Hypertension-   End-of-life discussion    Atrial fibrillation seemingly  quiescient.  Eliquis is appropriately dosed at 5 mg twice daily.  Hemoglobin stable  Blood pressure is lower.  Will continue amlodipine and Cozaar discontinuing the labetalol given her concomitant bradycardia  She continues to have her DNR.  Palliative care is still being pursued.

## 2022-04-05 DIAGNOSIS — E7800 Pure hypercholesterolemia, unspecified: Secondary | ICD-10-CM | POA: Diagnosis not present

## 2022-04-05 DIAGNOSIS — I10 Essential (primary) hypertension: Secondary | ICD-10-CM | POA: Diagnosis not present

## 2022-04-05 DIAGNOSIS — G2581 Restless legs syndrome: Secondary | ICD-10-CM | POA: Diagnosis not present

## 2022-04-05 DIAGNOSIS — E1169 Type 2 diabetes mellitus with other specified complication: Secondary | ICD-10-CM | POA: Diagnosis not present

## 2022-04-05 DIAGNOSIS — M5135 Other intervertebral disc degeneration, thoracolumbar region: Secondary | ICD-10-CM | POA: Diagnosis not present

## 2022-04-05 DIAGNOSIS — F418 Other specified anxiety disorders: Secondary | ICD-10-CM | POA: Diagnosis not present

## 2022-04-06 DIAGNOSIS — F418 Other specified anxiety disorders: Secondary | ICD-10-CM | POA: Diagnosis not present

## 2022-04-06 DIAGNOSIS — I10 Essential (primary) hypertension: Secondary | ICD-10-CM | POA: Diagnosis not present

## 2022-04-06 DIAGNOSIS — M5135 Other intervertebral disc degeneration, thoracolumbar region: Secondary | ICD-10-CM | POA: Diagnosis not present

## 2022-04-06 DIAGNOSIS — G2581 Restless legs syndrome: Secondary | ICD-10-CM | POA: Diagnosis not present

## 2022-04-06 DIAGNOSIS — E7800 Pure hypercholesterolemia, unspecified: Secondary | ICD-10-CM | POA: Diagnosis not present

## 2022-04-06 DIAGNOSIS — E1169 Type 2 diabetes mellitus with other specified complication: Secondary | ICD-10-CM | POA: Diagnosis not present

## 2022-04-14 DIAGNOSIS — G2581 Restless legs syndrome: Secondary | ICD-10-CM | POA: Diagnosis not present

## 2022-04-14 DIAGNOSIS — E1169 Type 2 diabetes mellitus with other specified complication: Secondary | ICD-10-CM | POA: Diagnosis not present

## 2022-04-14 DIAGNOSIS — M5135 Other intervertebral disc degeneration, thoracolumbar region: Secondary | ICD-10-CM | POA: Diagnosis not present

## 2022-04-14 DIAGNOSIS — E7800 Pure hypercholesterolemia, unspecified: Secondary | ICD-10-CM | POA: Diagnosis not present

## 2022-04-14 DIAGNOSIS — I10 Essential (primary) hypertension: Secondary | ICD-10-CM | POA: Diagnosis not present

## 2022-04-14 DIAGNOSIS — F418 Other specified anxiety disorders: Secondary | ICD-10-CM | POA: Diagnosis not present

## 2022-04-18 ENCOUNTER — Ambulatory Visit (INDEPENDENT_AMBULATORY_CARE_PROVIDER_SITE_OTHER): Payer: Medicare Other | Admitting: Neurology

## 2022-04-18 ENCOUNTER — Encounter: Payer: Self-pay | Admitting: Neurology

## 2022-04-18 VITALS — BP 132/78 | HR 77 | Ht 61.0 in

## 2022-04-18 DIAGNOSIS — G309 Alzheimer's disease, unspecified: Secondary | ICD-10-CM

## 2022-04-18 DIAGNOSIS — R5381 Other malaise: Secondary | ICD-10-CM | POA: Diagnosis not present

## 2022-04-18 DIAGNOSIS — F418 Other specified anxiety disorders: Secondary | ICD-10-CM | POA: Diagnosis not present

## 2022-04-18 DIAGNOSIS — G2581 Restless legs syndrome: Secondary | ICD-10-CM | POA: Diagnosis not present

## 2022-04-18 DIAGNOSIS — G259 Extrapyramidal and movement disorder, unspecified: Secondary | ICD-10-CM

## 2022-04-18 DIAGNOSIS — F0280 Dementia in other diseases classified elsewhere without behavioral disturbance: Secondary | ICD-10-CM

## 2022-04-18 DIAGNOSIS — R112 Nausea with vomiting, unspecified: Secondary | ICD-10-CM | POA: Diagnosis not present

## 2022-04-18 DIAGNOSIS — I10 Essential (primary) hypertension: Secondary | ICD-10-CM | POA: Diagnosis not present

## 2022-04-18 DIAGNOSIS — E1169 Type 2 diabetes mellitus with other specified complication: Secondary | ICD-10-CM | POA: Diagnosis not present

## 2022-04-18 DIAGNOSIS — E7800 Pure hypercholesterolemia, unspecified: Secondary | ICD-10-CM | POA: Diagnosis not present

## 2022-04-18 DIAGNOSIS — M5135 Other intervertebral disc degeneration, thoracolumbar region: Secondary | ICD-10-CM | POA: Diagnosis not present

## 2022-04-18 MED ORDER — ONDANSETRON 4 MG PO TBDP: 4.0000 mg | ORAL_TABLET | Freq: Three times a day (TID) | ORAL | 0 refills | Status: AC | PRN

## 2022-04-18 MED ORDER — DONEPEZIL HCL 10 MG PO TABS: 10.0000 mg | ORAL_TABLET | Freq: Every day | ORAL | 0 refills | Status: DC

## 2022-04-18 MED ORDER — METOCLOPRAMIDE HCL 5 MG/ML IJ SOLN: 5.0000 mg | Freq: Once | INTRAMUSCULAR | Status: DC

## 2022-04-18 MED ORDER — METOCLOPRAMIDE HCL 5 MG/ML IJ SOLN
10.0000 mg | Freq: Once | INTRAMUSCULAR | Status: AC
  Administered 2022-04-18: 5 mg via INTRAMUSCULAR

## 2022-04-18 MED ORDER — PRAMIPEXOLE DIHYDROCHLORIDE 1 MG PO TABS: 1.0000 mg | ORAL_TABLET | Freq: Every day | ORAL | 3 refills | Status: AC

## 2022-04-18 NOTE — Patient Instructions (Addendum)
Return to clinic in 1 year.

## 2022-04-18 NOTE — Progress Notes (Signed)
Follow-up Visit   Date: 04/18/22   Kelly Boone MRN: [id number] DOB: [birth year]   Interim History: Kelly Boone is a 89 y.o. right-handed Caucasian female with atrial fibrillation, hypertension, CKD, depression, anxiety, diabetes mellitus, GERD, and hyperlipidemia returning to the clinic for follow-up of restless leg syndrome and Alzheimer's dementia.  The patient was accompanied to the clinic by daughter who also provides collateral information.     IMPRESSION/PLAN: 1.  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 Movements were witnessed in the office and were distractible and arrhythmical.  She maintained awareness the entire time.  Discussed that movements are most likely stress reaction.  Treatment is conservative with deep breathing, mindfulness, distraction, etc.    2.  Restless leg syndrome, well-controlled on pramipexole 1 mg at bedtime.  Refills provided.  3.  Alzheimer's dementia with behavior change, progressively worsening.  She is oriented to self and place.  She is able to bathe and dress self, but needs assistance with most other tasks.  Daughter is the primary caregiver and with her at all times.  They are awaiting palliative care referral.  Continue aricept '10mg'$  daily and Celexa '10mg'$  daily.    4.  Generalized debility.  Continue supportive care.   5.  Nausea.  Reglan '5mg'$  IM administered in the office.  Prescription for zofran '4mg'$  prn provided.   Return to clinic in 1 year  -------------------------------------------------------------- History of present illness: She had had RLS for many years and pramipexole 0.'5mg'$  for many years, however during the fall of 2019, she began having much more intense discomfort.  She used to have to walk at night to get relief.  Her pramipexole was increased to '1mg'$  at bedtime which has markedly helped her RLS.  She does not complain of daytime symptoms.   She had had two spells of generalized  shaking/trembling of the body, which lasts about 4-5 minutes.  There is no loss consciousness, weakness, numbness/tingling, or syncopal spells.  She had a video which shows high amplitude tremor of the trunk, which did not disrupt her ability to walk.   UPDATE 02/19/2020: Over the past year, there has been gradually deterioration of memory and change in behavior.  She is able to perform all ADLS as well as IADLs, including driving (locally).  Daughter does not have safety concerns with driving.  She spends most of her time with her 59 year-old sister to go to lunch, [place], and the bank. When at home, she tries to help with household chores, but daughter feels that it is not always very helpful.  She gives an example of patient cleaning the garage, but noticing that the boxes were packed to drop off at [place].  For years, she has baked brownies and recently daughter has noticed that she is having difficulty using measuring cups when cooking.  For instance, she cannot tell the difference between 1/3 cup and 1/4 cup. She is forgetting to take her medication about once per week. She is having difficulty comprehending information.  Mood is good.  Patient is complaining about right sided achy hip pain.  UPDATE 03/04/2021:  Over the past year, she has had progressive loss of memory. She is having issues distinguish reality and paranoia.  She does puzzles, enjoys crocheting, and interactive with her sister. She continues to drive daily to a [employer], she did get lost on one occasion. She is forgetting daily hygiene such as bathing and medications, so daughter has started to oversee more of her  daily activities.  Daughter is also concerned about her poor diet choices. Patient admits to having a sweet tooth.  UPDATE 04/18/2022:  She is here for 1 year follow-up.  Her memory has been progressively worse.  She can perform her ADLs, but sometimes needs assistance and a lot of prompting. She is no longer driving.   She sittings in the recliner and stays busy coloring or word searches throughout the day.    She also has frequent headaches and takes tylenol. She also takes tramadol and robaxin for low back pain.  She has been getting home PT.    She has spells of generalized shaking/trembling of the body which lasts anywhere from minutes up to an hour.  The previously used to be less frequently occurring 1-3 every few weeks, but over the past week, she had had three episodes.  Daughter gives her diazepam '5mg'$  which is written by PCP.  Over the last weekend, she had associated nausea and vomiting which was new.   Medications:  Current Outpatient Medications on File Prior to Visit  Medication Sig Dispense Refill   albuterol (PROVENTIL HFA;VENTOLIN HFA) 108 (90 BASE) MCG/ACT inhaler Inhale 2 puffs into the lungs every 6 (six) hours as needed for wheezing or shortness of breath.     amLODipine (NORVASC) 5 MG tablet Take 1 tablet (5 mg total) by mouth daily. 90 tablet 3   apixaban (ELIQUIS) 5 MG TABS tablet Take 1 tablet (5 mg total) by mouth 2 (two) times daily. 180 tablet 3   BIOTIN PO Take 5,000 mg by mouth daily.      citalopram (CELEXA) 10 MG tablet Take 1 tablet by mouth daily.  3   cloNIDine (CATAPRES) 0.2 MG tablet Take 0.2 mg by mouth as needed (For BP above 160).     cyanocobalamin 500 MCG tablet Take 1,000 mcg by mouth daily.      diazepam (VALIUM) 5 MG tablet Take 5 mg by mouth as needed.     furosemide (LASIX) 20 MG tablet TAKE ONE TABLET BY MOUTH ONCE DAILY AS NEEDED FOR  EDEMA 30 tablet 10   losartan (COZAAR) 50 MG tablet Take 1 tablet by mouth daily.  3   Magnesium 250 MG TABS Take 2 tablets by mouth every morning.      methocarbamol (ROBAXIN) 500 MG tablet Take 500 mg by mouth every 6 (six) hours as needed.     propranolol (INDERAL) 10 MG tablet Take 1 tablet (10 mg total) by mouth as needed. 90 tablet 3   traMADol (ULTRAM) 50 MG tablet Take 50 mg by mouth every 6 (six) hours as needed.      Cholecalciferol (VITAMIN D PO) Take 1,000 Units by mouth daily.     Current Facility-Administered Medications on File Prior to Visit  Medication Dose Route Frequency Provider Last Rate Last Admin   propranolol (INDERAL) tablet 10 mg  10 mg Oral Q6H PRN Deboraha Sprang, MD        Allergies:  Allergies  Allergen Reactions   Oxaprozin Other (See Comments)    Palpitation   Labetalol Swelling   Statins Other (See Comments)    Myalgias  Patient denies this allergy at preop visit 10-08-14   Zetia [Ezetimibe] Other (See Comments)    Joint Pain, Swelling    Vital Signs:  BP 132/78   Pulse 77   Ht '5\' 1"'$  (1.549 m)   SpO2 99%   BMI 28.19 kg/m   Neurological Exam: MENTAL STATUS:  patient  oriented to self and daughter.  She correctly identifies home address, DOB, and telephone number.  She does not know he year or daughter's number.  Incorrectly states the day of the week.  Speech is not dysarthric.  Pleasant affect.    CRANIAL NERVES:  Pupils equal round and reactive to light.  Normal conjugate, extra-ocular eye movements in all directions of gaze.  No ptosis.    MOTOR:  Motor strength is antigravity throughout.  During the visit, she had trucal shaking/bobbing which lasted a few minutes, amplitude and intensity of movement varied.  Somewhat distractible and self-resolves.  She had similar spell in the lobby which resolved with patient standing. No resting tremor.   COORDINATION/GAIT:   Intact rapid alternating movements bilaterally.  Patient in wheelchair today Data:  Lab Results  Component Value Date   TSH 4.26 04/08/2018   Lab Results  Component Value Date   [id number] 381 (H) 04/08/2018    Total time spent reviewing records, interview, history/exam, documentation, and coordination of care on day of encounter:  40 min   Thank you for allowing me to participate in patient's care.  If I can answer any additional questions, I would be pleased to do so.    Sincerely,    Theophil Thivierge  K. Posey Pronto, DO

## 2022-04-19 DIAGNOSIS — E7800 Pure hypercholesterolemia, unspecified: Secondary | ICD-10-CM | POA: Diagnosis not present

## 2022-04-19 DIAGNOSIS — G2581 Restless legs syndrome: Secondary | ICD-10-CM | POA: Diagnosis not present

## 2022-04-19 DIAGNOSIS — I10 Essential (primary) hypertension: Secondary | ICD-10-CM | POA: Diagnosis not present

## 2022-04-19 DIAGNOSIS — E1169 Type 2 diabetes mellitus with other specified complication: Secondary | ICD-10-CM | POA: Diagnosis not present

## 2022-04-19 DIAGNOSIS — F418 Other specified anxiety disorders: Secondary | ICD-10-CM | POA: Diagnosis not present

## 2022-04-19 DIAGNOSIS — M5135 Other intervertebral disc degeneration, thoracolumbar region: Secondary | ICD-10-CM | POA: Diagnosis not present

## 2022-04-20 ENCOUNTER — Telehealth: Payer: Self-pay

## 2022-04-20 DIAGNOSIS — G309 Alzheimer's disease, unspecified: Secondary | ICD-10-CM

## 2022-04-20 DIAGNOSIS — G259 Extrapyramidal and movement disorder, unspecified: Secondary | ICD-10-CM

## 2022-04-20 MED ORDER — WHEELCHAIR MISC: 0 refills | Status: AC

## 2022-04-20 NOTE — Telephone Encounter (Signed)
Kelly Boone a Physical Therapist with [facility]. Requesting a prescription for a wheelchair due to her shaking episodes. She would like it sent to [facility]. Kelly Boone stated that they have requested this by the PCP Dr. Helene Kelp but some issues with her visits is delaying her PCP from ordering the wheelchair for her. She stated we can call her back at [phone] if we have any questions.

## 2022-04-20 NOTE — Telephone Encounter (Signed)
Hoyle Sauer from PT was informed that [facility] was going to be sending the order for the wheelchair.

## 2022-04-21 NOTE — Telephone Encounter (Signed)
Helene Kelp from [facility] is calling in stating they need narrative notes that say she can't use walker or wheelchair. Will be faxing over this information to [place].

## 2022-04-23 DIAGNOSIS — Z9089 Acquired absence of other organs: Secondary | ICD-10-CM | POA: Diagnosis not present

## 2022-04-23 DIAGNOSIS — F418 Other specified anxiety disorders: Secondary | ICD-10-CM | POA: Diagnosis not present

## 2022-04-23 DIAGNOSIS — R413 Other amnesia: Secondary | ICD-10-CM | POA: Diagnosis not present

## 2022-04-23 DIAGNOSIS — I10 Essential (primary) hypertension: Secondary | ICD-10-CM | POA: Diagnosis not present

## 2022-04-23 DIAGNOSIS — M5135 Other intervertebral disc degeneration, thoracolumbar region: Secondary | ICD-10-CM | POA: Diagnosis not present

## 2022-04-23 DIAGNOSIS — E1169 Type 2 diabetes mellitus with other specified complication: Secondary | ICD-10-CM | POA: Diagnosis not present

## 2022-04-23 DIAGNOSIS — Z9849 Cataract extraction status, unspecified eye: Secondary | ICD-10-CM | POA: Diagnosis not present

## 2022-04-23 DIAGNOSIS — Z7901 Long term (current) use of anticoagulants: Secondary | ICD-10-CM | POA: Diagnosis not present

## 2022-04-23 DIAGNOSIS — G2581 Restless legs syndrome: Secondary | ICD-10-CM | POA: Diagnosis not present

## 2022-04-23 DIAGNOSIS — Z9049 Acquired absence of other specified parts of digestive tract: Secondary | ICD-10-CM | POA: Diagnosis not present

## 2022-04-23 DIAGNOSIS — E7800 Pure hypercholesterolemia, unspecified: Secondary | ICD-10-CM | POA: Diagnosis not present

## 2022-04-28 NOTE — Telephone Encounter (Signed)
Called patients daughter [name] and informed her that insurance is asking for another appointment for a wheelchair assessment. I informed [name] that at this point she may want to consider paying out of pocket for a standard wheelchair which can be about $120 or less. I informed debbie to check out hospice thrift shops, [employer], [place],. [employer], [employer] to see if she can get a deal at any of those places. I informed [name] that if they absolutely wanted to have insurance pay for the wheelchair it would be best to go through her PCP. Debbie verbalized understanding and thanked me for the call.

## 2022-05-04 DIAGNOSIS — F418 Other specified anxiety disorders: Secondary | ICD-10-CM | POA: Diagnosis not present

## 2022-05-04 DIAGNOSIS — G2581 Restless legs syndrome: Secondary | ICD-10-CM | POA: Diagnosis not present

## 2022-05-04 DIAGNOSIS — I10 Essential (primary) hypertension: Secondary | ICD-10-CM | POA: Diagnosis not present

## 2022-05-04 DIAGNOSIS — E1169 Type 2 diabetes mellitus with other specified complication: Secondary | ICD-10-CM | POA: Diagnosis not present

## 2022-05-04 DIAGNOSIS — M5135 Other intervertebral disc degeneration, thoracolumbar region: Secondary | ICD-10-CM | POA: Diagnosis not present

## 2022-05-04 DIAGNOSIS — E7800 Pure hypercholesterolemia, unspecified: Secondary | ICD-10-CM | POA: Diagnosis not present

## 2022-05-10 DIAGNOSIS — I10 Essential (primary) hypertension: Secondary | ICD-10-CM | POA: Diagnosis not present

## 2022-05-10 DIAGNOSIS — G2581 Restless legs syndrome: Secondary | ICD-10-CM | POA: Diagnosis not present

## 2022-05-10 DIAGNOSIS — M5135 Other intervertebral disc degeneration, thoracolumbar region: Secondary | ICD-10-CM | POA: Diagnosis not present

## 2022-05-10 DIAGNOSIS — F418 Other specified anxiety disorders: Secondary | ICD-10-CM | POA: Diagnosis not present

## 2022-05-10 DIAGNOSIS — E7800 Pure hypercholesterolemia, unspecified: Secondary | ICD-10-CM | POA: Diagnosis not present

## 2022-05-10 DIAGNOSIS — E1169 Type 2 diabetes mellitus with other specified complication: Secondary | ICD-10-CM | POA: Diagnosis not present

## 2022-05-18 DIAGNOSIS — I10 Essential (primary) hypertension: Secondary | ICD-10-CM | POA: Diagnosis not present

## 2022-05-18 DIAGNOSIS — E1169 Type 2 diabetes mellitus with other specified complication: Secondary | ICD-10-CM | POA: Diagnosis not present

## 2022-05-18 DIAGNOSIS — M5135 Other intervertebral disc degeneration, thoracolumbar region: Secondary | ICD-10-CM | POA: Diagnosis not present

## 2022-05-18 DIAGNOSIS — F418 Other specified anxiety disorders: Secondary | ICD-10-CM | POA: Diagnosis not present

## 2022-05-18 DIAGNOSIS — G2581 Restless legs syndrome: Secondary | ICD-10-CM | POA: Diagnosis not present

## 2022-05-18 DIAGNOSIS — E7800 Pure hypercholesterolemia, unspecified: Secondary | ICD-10-CM | POA: Diagnosis not present

## 2022-05-23 DIAGNOSIS — Z9849 Cataract extraction status, unspecified eye: Secondary | ICD-10-CM | POA: Diagnosis not present

## 2022-05-23 DIAGNOSIS — E7800 Pure hypercholesterolemia, unspecified: Secondary | ICD-10-CM | POA: Diagnosis not present

## 2022-05-23 DIAGNOSIS — F418 Other specified anxiety disorders: Secondary | ICD-10-CM | POA: Diagnosis not present

## 2022-05-23 DIAGNOSIS — I10 Essential (primary) hypertension: Secondary | ICD-10-CM | POA: Diagnosis not present

## 2022-05-23 DIAGNOSIS — Z9049 Acquired absence of other specified parts of digestive tract: Secondary | ICD-10-CM | POA: Diagnosis not present

## 2022-05-23 DIAGNOSIS — Z7901 Long term (current) use of anticoagulants: Secondary | ICD-10-CM | POA: Diagnosis not present

## 2022-05-23 DIAGNOSIS — G2581 Restless legs syndrome: Secondary | ICD-10-CM | POA: Diagnosis not present

## 2022-05-23 DIAGNOSIS — M5135 Other intervertebral disc degeneration, thoracolumbar region: Secondary | ICD-10-CM | POA: Diagnosis not present

## 2022-05-23 DIAGNOSIS — Z9089 Acquired absence of other organs: Secondary | ICD-10-CM | POA: Diagnosis not present

## 2022-05-23 DIAGNOSIS — R413 Other amnesia: Secondary | ICD-10-CM | POA: Diagnosis not present

## 2022-05-23 DIAGNOSIS — E1169 Type 2 diabetes mellitus with other specified complication: Secondary | ICD-10-CM | POA: Diagnosis not present

## 2022-05-29 DIAGNOSIS — E7800 Pure hypercholesterolemia, unspecified: Secondary | ICD-10-CM | POA: Diagnosis not present

## 2022-05-29 DIAGNOSIS — G2581 Restless legs syndrome: Secondary | ICD-10-CM | POA: Diagnosis not present

## 2022-05-29 DIAGNOSIS — I10 Essential (primary) hypertension: Secondary | ICD-10-CM | POA: Diagnosis not present

## 2022-05-29 DIAGNOSIS — M5135 Other intervertebral disc degeneration, thoracolumbar region: Secondary | ICD-10-CM | POA: Diagnosis not present

## 2022-05-29 DIAGNOSIS — F418 Other specified anxiety disorders: Secondary | ICD-10-CM | POA: Diagnosis not present

## 2022-05-29 DIAGNOSIS — E1169 Type 2 diabetes mellitus with other specified complication: Secondary | ICD-10-CM | POA: Diagnosis not present

## 2022-06-15 DIAGNOSIS — G2581 Restless legs syndrome: Secondary | ICD-10-CM | POA: Diagnosis not present

## 2022-06-15 DIAGNOSIS — M5135 Other intervertebral disc degeneration, thoracolumbar region: Secondary | ICD-10-CM | POA: Diagnosis not present

## 2022-06-15 DIAGNOSIS — E1169 Type 2 diabetes mellitus with other specified complication: Secondary | ICD-10-CM | POA: Diagnosis not present

## 2022-06-15 DIAGNOSIS — F418 Other specified anxiety disorders: Secondary | ICD-10-CM | POA: Diagnosis not present

## 2022-06-15 DIAGNOSIS — E7800 Pure hypercholesterolemia, unspecified: Secondary | ICD-10-CM | POA: Diagnosis not present

## 2022-06-15 DIAGNOSIS — I10 Essential (primary) hypertension: Secondary | ICD-10-CM | POA: Diagnosis not present

## 2022-11-14 DIAGNOSIS — Z6824 Body mass index (BMI) 24.0-24.9, adult: Secondary | ICD-10-CM | POA: Diagnosis not present

## 2022-11-14 DIAGNOSIS — E1169 Type 2 diabetes mellitus with other specified complication: Secondary | ICD-10-CM | POA: Diagnosis not present

## 2022-11-14 DIAGNOSIS — E7800 Pure hypercholesterolemia, unspecified: Secondary | ICD-10-CM | POA: Diagnosis not present

## 2022-11-14 DIAGNOSIS — E559 Vitamin D deficiency, unspecified: Secondary | ICD-10-CM | POA: Diagnosis not present

## 2022-11-14 DIAGNOSIS — Z9089 Acquired absence of other organs: Secondary | ICD-10-CM | POA: Diagnosis not present

## 2022-11-14 DIAGNOSIS — Z9889 Other specified postprocedural states: Secondary | ICD-10-CM | POA: Diagnosis not present

## 2022-11-16 ENCOUNTER — Telehealth: Payer: Self-pay

## 2022-11-16 NOTE — Patient Outreach (Signed)
  Care Coordination   Initial Visit Note   11/16/2022 Name: Kelly Boone MRN: [id number] DOB: [birth year]  Kelly Boone is a 89 y.o. year old female who sees Marylen Ponto, MD for primary care. I spoke with  Kelly Boone by phone today.  What matters to the patients health and wellness today?  Placed call to patient and spoke with daughter due to patients  dementia.  Daughter reports that she is living her her mom and she is her caregiver.  Daughter report she provides all the care for her home but gets exhausted at times.  Daughter reports she is managing well right now but might need our services in the future. I provided my contact information for daughter to call back if needed.   SDOH assessments and interventions completed:  No     Care Coordination Interventions:  Yes, provided   Follow up plan: No further intervention required.   Encounter Outcome:  Pt. Visit Completed   Rowe Pavy, RN, BSN, CEN [facility] Coordinator [phone]

## 2023-04-03 ENCOUNTER — Encounter: Payer: Self-pay | Admitting: Neurology

## 2023-04-23 ENCOUNTER — Ambulatory Visit: Payer: Medicare Other | Admitting: Neurology

## 2023-05-01 ENCOUNTER — Ambulatory Visit (INDEPENDENT_AMBULATORY_CARE_PROVIDER_SITE_OTHER): Payer: Medicare Other | Admitting: Neurology

## 2023-05-01 ENCOUNTER — Encounter: Payer: Self-pay | Admitting: Neurology

## 2023-05-01 VITALS — BP 114/67 | HR 87 | Ht 61.0 in | Wt 140.0 lb

## 2023-05-01 DIAGNOSIS — G2581 Restless legs syndrome: Secondary | ICD-10-CM

## 2023-05-01 DIAGNOSIS — G309 Alzheimer's disease, unspecified: Secondary | ICD-10-CM | POA: Diagnosis not present

## 2023-05-01 DIAGNOSIS — G259 Extrapyramidal and movement disorder, unspecified: Secondary | ICD-10-CM

## 2023-05-01 DIAGNOSIS — F0280 Dementia in other diseases classified elsewhere without behavioral disturbance: Secondary | ICD-10-CM

## 2023-05-01 MED ORDER — QUETIAPINE FUMARATE 25 MG PO TABS: 25.0000 mg | ORAL_TABLET | Freq: Every day | ORAL | 3 refills | Status: AC

## 2023-05-01 MED ORDER — DONEPEZIL HCL 10 MG PO TABS: 10.0000 mg | ORAL_TABLET | Freq: Every day | ORAL | 3 refills | Status: AC

## 2023-05-01 NOTE — Progress Notes (Signed)
Follow-up Visit   Date: 05/01/23   Kelly Boone MRN: [id number] DOB: 02-13-1932   Interim History: Kelly Boone is a 89 y.o. right-handed Caucasian female with atrial fibrillation, hypertension, CKD, depression, anxiety, diabetes mellitus, GERD, and hyperlipidemia returning to the clinic for follow-up of restless leg syndrome and Alzheimer's dementia.  The patient was accompanied to the clinic by Kelly Boone who also provides collateral information.     IMPRESSION/PLAN: 1.  Alzheimer's dementia with behavior change, worsening as expected with the course of the disease.      - Continue aricept 10mg  daily  - Continue celexa 10mg  daily  - Increase seroquel to 25mg  at bedtime  - Encouraged Kelly Boone to get extra help to alleviate caregiver burden  2.  Restless leg syndrome, stable  - Continue pramipexole 1mg  at bedtime  3.  Functional movement disorder, improved with seroquel  Return to clinic in 1 year  -------------------------------------------------------------- History of present illness: She had had RLS for many years and pramipexole 0.5mg  for many years, however during the fall of 2019, she began having much more intense discomfort.  She used to have to walk at night to get relief.  Her pramipexole was increased to 1mg  at bedtime which has markedly helped her RLS.  She does not complain of daytime symptoms.   She had had two spells of generalized shaking/trembling of the body, which lasts about 4-5 minutes.  There is no loss consciousness, weakness, numbness/tingling, or syncopal spells.  She had a video which shows high amplitude tremor of the trunk, which did not disrupt her ability to walk.   UPDATE 02/19/2020: Over the past year, there has been gradually deterioration of memory and change in behavior.  She is able to perform all ADLS as well as IADLs, including driving (locally).  Kelly Boone does not have safety concerns with driving.  She spends most of her time with her  32 year-old sister to go to lunch, [place], and the bank. When at home, she tries to help with household chores, but Kelly Boone feels that it is not always very helpful.  She gives an example of patient cleaning the garage, but noticing that the boxes were packed to drop off at [place].  For years, she has baked brownies and recently Kelly Boone has noticed that she is having difficulty using measuring cups when cooking.  For instance, she cannot tell the difference between 1/3 cup and 1/4 cup. She is forgetting to take her medication about once per week. She is having difficulty comprehending information.  Mood is good.  Patient is complaining about right sided achy hip pain.  UPDATE 03/04/2021:  Over the past year, she has had progressive loss of memory. She is having issues distinguish reality and paranoia.  She does puzzles, enjoys crocheting, and interactive with her sister. She continues to drive daily to a [employer], she did get lost on one occasion. She is forgetting daily hygiene such as bathing and medications, so Kelly Boone has started to oversee more of her daily activities.  Kelly Boone is also concerned about her poor diet choices. Patient admits to having a sweet tooth.  UPDATE 04/18/2022:  She is here for 1 year follow-up.  Her memory has been progressively worse.  She can perform her ADLs, but sometimes needs assistance and a lot of prompting. She is no longer driving.  She sittings in the recliner and stays busy coloring or word searches throughout the day.    She also has frequent headaches and takes  tylenol. She also takes tramadol and robaxin for low back pain.  She has been getting home PT.    She has spells of generalized shaking/trembling of the body which lasts anywhere from minutes up to an hour.  The previously used to be less frequently occurring 1-3 every few weeks, but over the past week, she had had three episodes.  Kelly Boone gives her diazepam 5mg  which is written by PCP.  Over  the last weekend, she had associated nausea and vomiting which was new.   UPDATE 05/01/2023:  She is here for 1 year follow-up.  Her PCP started her on seroquel 12.5mg  at bedtime, which she is taking a full tablet (25mg ) because it works better at a full dose.  She is sleeping better with this and her shaking episodes have dramatically improved.  Kelly Boone has observed she is getting more paranoid, especially when it gets dark.  She wants to close all the doors and blinds and has locked her Kelly Boone out.   Kelly Boone has little to no time for herself and expresses frustration with this, as she is always tending to her mother and household duties.   Medications:  Current Outpatient Medications on File Prior to Visit  Medication Sig Dispense Refill   albuterol (PROVENTIL HFA;VENTOLIN HFA) 108 (90 BASE) MCG/ACT inhaler Inhale 2 puffs into the lungs every 6 (six) hours as needed for wheezing or shortness of breath.     amLODipine (NORVASC) 5 MG tablet Take 1 tablet (5 mg total) by mouth daily. 90 tablet 3   apixaban (ELIQUIS) 5 MG TABS tablet Take 1 tablet (5 mg total) by mouth 2 (two) times daily. 180 tablet 3   BIOTIN PO Take 5,000 mg by mouth daily.      Cholecalciferol (VITAMIN D PO) Take 1,000 Units by mouth daily.     citalopram (CELEXA) 10 MG tablet Take 1 tablet by mouth daily.  3   cloNIDine (CATAPRES) 0.2 MG tablet Take 0.2 mg by mouth as needed (For BP above 160).     cyanocobalamin 500 MCG tablet Take 1,000 mcg by mouth daily.      diazepam (VALIUM) 5 MG tablet Take 5 mg by mouth as needed.     donepezil (ARICEPT) 10 MG tablet Take 1 tablet (10 mg total) by mouth daily. 90 tablet 0   furosemide (LASIX) 20 MG tablet TAKE ONE TABLET BY MOUTH ONCE DAILY AS NEEDED FOR  EDEMA 30 tablet 10   losartan (COZAAR) 50 MG tablet Take 1 tablet by mouth daily.  3   Magnesium 250 MG TABS Take 2 tablets by mouth every morning.      methocarbamol (ROBAXIN) 500 MG tablet Take 500 mg by mouth every 6 (six)  hours as needed.     Misc. Devices [facility]) MISC Dispense One Wheelchair Dx:G25.9, G30.9, F02.80 1 each 0   ondansetron (ZOFRAN-ODT) 4 MG disintegrating tablet Take 1 tablet (4 mg total) by mouth every 8 (eight) hours as needed for nausea or vomiting. 20 tablet 0   pramipexole (MIRAPEX) 1 MG tablet Take 1 tablet (1 mg total) by mouth at bedtime. 90 tablet 3   propranolol (INDERAL) 10 MG tablet Take 1 tablet (10 mg total) by mouth as needed. 90 tablet 3   QUEtiapine (SEROQUEL) 25 MG tablet Take by mouth.     traMADol (ULTRAM) 50 MG tablet Take 50 mg by mouth every 6 (six) hours as needed.     Current Facility-Administered Medications on File Prior to Visit  Medication  Dose Route Frequency Provider Last Rate Last Admin   propranolol (INDERAL) tablet 10 mg  10 mg Oral Q6H PRN Duke Salvia, MD        Allergies:  Allergies  Allergen Reactions   Oxaprozin Other (See Comments)    Palpitation   Labetalol Swelling   Statins Other (See Comments)    Myalgias  Patient denies this allergy at preop visit 10-08-14   Zetia [Ezetimibe] Other (See Comments)    Joint Pain, Swelling    Vital Signs:  BP 114/67   Pulse 87   Ht 5\' 1"  (1.549 m)   Wt 140 lb (63.5 kg)   SpO2 95%   BMI 26.45 kg/m   Neurological Exam: MENTAL STATUS:  patient oriented to self and Kelly Boone.  She correctly DOB and home address. Speech is not dysarthric.  Pleasant affect.    CRANIAL NERVES:  Pupils equal round and reactive to light.  Normal conjugate, extra-ocular eye movements in all directions of gaze.  No ptosis.    MOTOR:  Motor strength is antigravity throughout.    COORDINATION/GAIT:   Intact rapid alternating movements bilaterally.  Patient able to walk with a cane.   Data:  Lab Results  Component Value Date   TSH 4.26 04/08/2018   Lab Results  Component Value Date   VITAMINB12 920 (H) 04/08/2018    Total time spent reviewing records, interview, history/exam, documentation, and coordination of care  on day of encounter:  30 min    Thank you for allowing me to participate in patient's care.  If I can answer any additional questions, I would be pleased to do so.    Sincerely,    Lasaro Primm K. Allena Katz, DO

## 2023-08-16 DIAGNOSIS — N3091 Cystitis, unspecified with hematuria: Secondary | ICD-10-CM | POA: Diagnosis not present

## 2023-08-16 DIAGNOSIS — N3001 Acute cystitis with hematuria: Secondary | ICD-10-CM | POA: Diagnosis not present

## 2023-08-21 ENCOUNTER — Other Ambulatory Visit: Payer: Self-pay

## 2023-08-21 ENCOUNTER — Emergency Department (HOSPITAL_COMMUNITY)

## 2023-08-21 ENCOUNTER — Emergency Department (HOSPITAL_COMMUNITY)
Admission: EM | Admit: 2023-08-21 | Discharge: 2023-08-21 | Disposition: A | Discharge status: Home / Self Care | Attending: Emergency Medicine | Admitting: Emergency Medicine

## 2023-08-21 DIAGNOSIS — I493 Ventricular premature depolarization: Secondary | ICD-10-CM | POA: Diagnosis not present

## 2023-08-21 DIAGNOSIS — G959 Disease of spinal cord, unspecified: Secondary | ICD-10-CM | POA: Insufficient documentation

## 2023-08-21 DIAGNOSIS — Z7901 Long term (current) use of anticoagulants: Secondary | ICD-10-CM | POA: Insufficient documentation

## 2023-08-21 DIAGNOSIS — I252 Old myocardial infarction: Secondary | ICD-10-CM | POA: Insufficient documentation

## 2023-08-21 DIAGNOSIS — R072 Precordial pain: Secondary | ICD-10-CM

## 2023-08-21 DIAGNOSIS — R7989 Other specified abnormal findings of blood chemistry: Secondary | ICD-10-CM | POA: Insufficient documentation

## 2023-08-21 DIAGNOSIS — F0283 Dementia in other diseases classified elsewhere, unspecified severity, with mood disturbance: Secondary | ICD-10-CM | POA: Insufficient documentation

## 2023-08-21 DIAGNOSIS — R911 Solitary pulmonary nodule: Secondary | ICD-10-CM | POA: Diagnosis not present

## 2023-08-21 DIAGNOSIS — E1122 Type 2 diabetes mellitus with diabetic chronic kidney disease: Secondary | ICD-10-CM | POA: Diagnosis not present

## 2023-08-21 DIAGNOSIS — R079 Chest pain, unspecified: Secondary | ICD-10-CM | POA: Insufficient documentation

## 2023-08-21 DIAGNOSIS — I10 Essential (primary) hypertension: Secondary | ICD-10-CM | POA: Diagnosis not present

## 2023-08-21 DIAGNOSIS — G309 Alzheimer's disease, unspecified: Secondary | ICD-10-CM | POA: Diagnosis not present

## 2023-08-21 DIAGNOSIS — J45909 Unspecified asthma, uncomplicated: Secondary | ICD-10-CM | POA: Insufficient documentation

## 2023-08-21 DIAGNOSIS — I2510 Atherosclerotic heart disease of native coronary artery without angina pectoris: Secondary | ICD-10-CM | POA: Diagnosis not present

## 2023-08-21 DIAGNOSIS — N189 Chronic kidney disease, unspecified: Secondary | ICD-10-CM | POA: Diagnosis not present

## 2023-08-21 DIAGNOSIS — F0390 Unspecified dementia without behavioral disturbance: Secondary | ICD-10-CM | POA: Insufficient documentation

## 2023-08-21 DIAGNOSIS — R0789 Other chest pain: Secondary | ICD-10-CM | POA: Diagnosis not present

## 2023-08-21 DIAGNOSIS — I480 Paroxysmal atrial fibrillation: Secondary | ICD-10-CM | POA: Diagnosis not present

## 2023-08-21 DIAGNOSIS — Z79899 Other long term (current) drug therapy: Secondary | ICD-10-CM | POA: Insufficient documentation

## 2023-08-21 DIAGNOSIS — G2581 Restless legs syndrome: Secondary | ICD-10-CM | POA: Diagnosis not present

## 2023-08-21 DIAGNOSIS — I129 Hypertensive chronic kidney disease with stage 1 through stage 4 chronic kidney disease, or unspecified chronic kidney disease: Secondary | ICD-10-CM | POA: Diagnosis not present

## 2023-08-21 DIAGNOSIS — I517 Cardiomegaly: Secondary | ICD-10-CM | POA: Diagnosis not present

## 2023-08-21 DIAGNOSIS — I4891 Unspecified atrial fibrillation: Secondary | ICD-10-CM | POA: Diagnosis not present

## 2023-08-21 DIAGNOSIS — Z8249 Family history of ischemic heart disease and other diseases of the circulatory system: Secondary | ICD-10-CM | POA: Diagnosis not present

## 2023-08-21 DIAGNOSIS — I700 Atherosclerosis of aorta: Secondary | ICD-10-CM | POA: Diagnosis not present

## 2023-08-21 DIAGNOSIS — E041 Nontoxic single thyroid nodule: Secondary | ICD-10-CM | POA: Diagnosis not present

## 2023-08-21 LAB — CBC WITH DIFFERENTIAL/PLATELET
Abs Immature Granulocytes: 0.04 10*3/uL (ref 0.00–0.07)
Basophils Absolute: 0 10*3/uL (ref 0.0–0.1)
Basophils Relative: 1 %
Eosinophils Absolute: 0.3 10*3/uL (ref 0.0–0.5)
Eosinophils Relative: 3 %
HCT: 37.6 % (ref 36.0–46.0)
Hemoglobin: 12.5 g/dL (ref 12.0–15.0)
Immature Granulocytes: 1 %
Lymphocytes Relative: 24 %
Lymphs Abs: 1.9 10*3/uL (ref 0.7–4.0)
MCH: 31.4 pg (ref 26.0–34.0)
MCHC: 33.2 g/dL (ref 30.0–36.0)
MCV: 94.5 fL (ref 80.0–100.0)
Monocytes Absolute: 0.6 10*3/uL (ref 0.1–1.0)
Monocytes Relative: 7 %
Neutro Abs: 5.2 10*3/uL (ref 1.7–7.7)
Neutrophils Relative %: 64 %
Platelets: 479 10*3/uL — ABNORMAL HIGH (ref 150–400)
RBC: 3.98 MIL/uL (ref 3.87–5.11)
RDW: 14.6 % (ref 11.5–15.5)
WBC: 8.1 10*3/uL (ref 4.0–10.5)
nRBC: 0 % (ref 0.0–0.2)

## 2023-08-21 LAB — BRAIN NATRIURETIC PEPTIDE: B Natriuretic Peptide: 320 pg/mL — ABNORMAL HIGH (ref 0.0–100.0)

## 2023-08-21 LAB — BASIC METABOLIC PANEL WITH GFR
Anion gap: 7 (ref 5–15)
BUN: 16 mg/dL (ref 8–23)
CO2: 25 mmol/L (ref 22–32)
Calcium: 8.6 mg/dL — ABNORMAL LOW (ref 8.9–10.3)
Chloride: 103 mmol/L (ref 98–111)
Creatinine, Ser: 0.91 mg/dL (ref 0.44–1.00)
GFR, Estimated: >60 mL/min (ref >60)
Glucose, Bld: 100 mg/dL — ABNORMAL HIGH (ref 70–99)
Potassium: 4.4 mmol/L (ref 3.5–5.1)
Sodium: 135 mmol/L (ref 135–145)

## 2023-08-21 LAB — RESP PANEL BY RT-PCR (RSV, FLU A&B, COVID)  RVPGX2
Influenza A by PCR: NEGATIVE
Influenza B by PCR: NEGATIVE
Resp Syncytial Virus by PCR: NEGATIVE
SARS Coronavirus 2 by RT PCR: NEGATIVE

## 2023-08-21 LAB — TROPONIN I (HIGH SENSITIVITY)
Troponin I (High Sensitivity): 14 ng/L (ref <18)
Troponin I (High Sensitivity): 14 ng/L (ref <18)

## 2023-08-21 MED ORDER — IOHEXOL 350 MG/ML SOLN
65.0000 mL | Freq: Once | INTRAVENOUS | Status: AC | PRN
  Administered 2023-08-21: 65 mL via INTRAVENOUS

## 2023-08-21 NOTE — Discharge Instructions (Addendum)
 It was a pleasure caring for you today in the emergency department.  Please see your PCP regarding incidental lesion noted in your lumbar spine for outpatient MRI  Return to the Emergency Department if you have unusual chest pain, pressure, or discomfort, shortness of breath, nausea, vomiting, burping, heartburn, tingling upper body parts, sweating, cold, clammy skin, or racing heartbeat. Call 911 if you think you are having a heart attack. Take all cardiac medications as prescribed - notify your doctor if you have any side effects. Follow cardiac diet - avoid fatty & fried foods, don't eat too much red meat, eat lots of fruits & vegetables, and dairy products should be low fat. Please lose weight if you are overweight. Become more active with walking, gardening, or any other activity that gets you to moving.   Please return to the emergency department immediately for any new or concerning symptoms, or if you get worse.

## 2023-08-21 NOTE — ED Provider Notes (Signed)
 [facility] Provider Note  CSN: [id number] Arrival date & time: 08/21/23 0801  Chief Complaint(s) Chest Pain  HPI Kelly Boone is a 89 y.o. female with past medical history as below, significant for paroxysmal atrial fibrillation on Eliquis , CKD, restless legs, dementia who presents to the ED with complaint of chest pain  Kelly Boone reports intermittent episodes of chest pain over the past 2 weeks.  Described as a sharp, times aching sensation.  Nonradiating.  Unable to identify alleviating or exacerbating factors.  Last episode occurred this morning, reports it woke her up from sleep.  Pain is gradually improved since the onset this morning.  Describes it as pleuritic.  Denies similar pain prior to 2 weeks ago.  No falls, no coughing, no fevers, no abdominal pain nausea or vomiting.  No syncope.  Reports compliance with her home medications  Past Medical History Past Medical History:  Diagnosis Date   A-fib (HCC)    irregular heartbeat   Allergic rhinitis    Anxiety    Arthritis    Asthma    Atopic dermatitis    Benign essential hypertension    Chronic kidney disease    hx of kidney stones as teenager   Depression    Disorders of magnesium metabolism    DM (diabetes mellitus) (HCC) 2004   Edema    Encounter for long-term (current) use of other medications    Esophagitis    GERD (gastroesophageal reflux disease)    Headache    Hearing loss    Hypercalcemia    Hyperlipemia 1997   Hypertension 1997   Hyposmolality and/or hyponatremia    Myalgia and myositis, unspecified    Myocardial infarction [facility])    June 2015, was told caused by stress   Need for prophylactic vaccination and inoculation against influenza    Osteoporosis, unspecified    Other screening mammogram    Other syndromes affecting cervical region    Pain in joint, lower leg    Pain in joint, shoulder region    Peripheral autonomic neuropathy in disorders classified  elsewhere(337.1)    Pneumonia    as infant   Restless legs syndrome (RLS)    Screening for depression    Sinus bradycardia    Special screening for malignant neoplasms, colon    Type 2 diabetes mellitus with autonomic neuropathy (HCC)    Unspecified essential hypertension    Unspecified vitamin D deficiency    Urinary tract infection, site not specified    Varicose veins of lower extremities with inflammation    Patient Active Problem List   Diagnosis Date Noted   Restless leg syndrome 04/08/2018   Alzheimer's dementia without behavioral disturbance (HCC) 04/08/2018   Muscle cramp 04/08/2018   Body mass index (BMI) of 28.0 to 28.9 in adult 06/16/2015   Uninodular goiter 05/05/2015   Primary hyperparathyroidism (HCC) 05/05/2015   History of total knee arthroplasty 10/14/2014   Takotsubo syndrome 09/28/2013   A-fib (HCC)    Hypertension    Sinus bradycardia    Home Medication(s) Prior to Admission medications   Medication Sig Start Date End Date Taking? Authorizing Provider  albuterol  (PROVENTIL  HFA;VENTOLIN  HFA) 108 (90 BASE) MCG/ACT inhaler Inhale 2 puffs into the lungs every 6 (six) hours as needed for wheezing or shortness of breath.    [provider]  amLODipine  (NORVASC ) 5 MG tablet Take 1 tablet (5 mg total) by mouth daily. 06/13/21   Verona Goodwill, MD  apixaban  (  ELIQUIS ) 5 MG TABS tablet Take 1 tablet (5 mg total) by mouth 2 (two) times daily. 06/27/21   Verona Goodwill, MD  BIOTIN PO Take 5,000 mg by mouth daily.     [provider]  Cholecalciferol (VITAMIN D PO) Take 1,000 Units by mouth daily.    [provider]  citalopram (CELEXA) 10 MG tablet Take 1 tablet by mouth daily. 12/08/17   [provider]  cloNIDine  (CATAPRES ) 0.2 MG tablet Take 0.2 mg by mouth as needed (For BP above 160).    [provider]  cyanocobalamin  500 MCG tablet Take 1,000 mcg by mouth daily.     [provider]  diazepam  (VALIUM ) 5 MG tablet  Take 5 mg by mouth as needed. 01/28/20   [provider]  donepezil  (ARICEPT ) 10 MG tablet Take 1 tablet (10 mg total) by mouth daily. 05/01/23   Patel, Donika K, DO  furosemide  (LASIX ) 20 MG tablet TAKE ONE TABLET BY MOUTH ONCE DAILY AS NEEDED FOR  EDEMA 03/18/15   Verona Goodwill, MD  losartan  (COZAAR ) 50 MG tablet Take 1 tablet by mouth daily. 01/23/18   [provider]  Magnesium 250 MG TABS Take 2 tablets by mouth every morning.     [provider]  methocarbamol  (ROBAXIN ) 500 MG tablet Take 500 mg by mouth every 6 (six) hours as needed. 02/22/22   [provider]  Misc. Devices [facility]) MISC Dispense One Wheelchair Dx:G25.9, G30.9, F02.80 04/20/22   Patel, Donika K, DO  ondansetron  (ZOFRAN -ODT) 4 MG disintegrating tablet Take 1 tablet (4 mg total) by mouth every 8 (eight) hours as needed for nausea or vomiting. 04/18/22   Patel, Donika K, DO  pramipexole  (MIRAPEX ) 1 MG tablet Take 1 tablet (1 mg total) by mouth at bedtime. 04/18/22   Patel, Donika K, DO  propranolol  (INDERAL ) 10 MG tablet Take 1 tablet (10 mg total) by mouth as needed. 03/12/19   Tylene Galla, PA-C  QUEtiapine  (SEROQUEL ) 25 MG tablet Take 1 tablet (25 mg total) by mouth at bedtime. 05/01/23   Patel, Donika K, DO  traMADol (ULTRAM) 50 MG tablet Take 50 mg by mouth every 6 (six) hours as needed. 03/06/22   [provider]                                                                                                                                    Past Surgical History Past Surgical History:  Procedure Laterality Date   ABDOMINAL HYSTERECTOMY     APPENDECTOMY     benign breast biopsy     CHOLECYSTECTOMY     LEFT HEART CATHETERIZATION WITH CORONARY ANGIOGRAM N/A 09/30/2013   Procedure: LEFT HEART CATHETERIZATION WITH CORONARY ANGIOGRAM;  Surgeon: Millicent Ally, MD;  Location: [facility] CATH LAB;  Service: Cardiovascular;  Laterality: N/A;   OTHER SURGICAL HISTORY     Nuclear  Stress Test with EF  70%, Chest Radiograph-lungs are clear without pleural effusions or focal consolidations. Trachea projects midline and there is no pneumothorax. Suture anchors in right humeral head.   SHOULDER SURGERY     Repair   TONSILLECTOMY     TOTAL KNEE ARTHROPLASTY Left 10/14/2014   Procedure: LEFT TOTAL KNEE ARTHROPLASTY;  Surgeon: Hazle Lites, MD;  Location: [facility] ORS;  Service: Orthopedics;  Laterality: Left;   Family History Family History  Problem Relation Age of Onset   CAD Brother    Anemia Mother    CAD Mother    Hypertension Mother     Social History Social History   Tobacco Use   Smoking status: Never   Smokeless tobacco: Never  Vaping Use   Vaping status: Never Used  Substance Use Topics   Alcohol use: No   Drug use: No   Allergies Oxaprozin, Labetalol , Statins, and Zetia [ezetimibe]  Review of Systems A thorough review of systems was obtained and all systems are negative except as noted in the HPI and PMH.   Physical Exam Vital Signs  I have reviewed the triage vital signs BP 132/68   Pulse 74   Temp 97.6 F (36.4 C) (Oral)   Resp (!) 23   SpO2 98%  Physical Exam Vitals and nursing note reviewed.  Constitutional:      General: Kelly Boone is not in acute distress.    Appearance: Normal appearance.  HENT:     Head: Normocephalic and atraumatic.     Right Ear: External ear normal.     Left Ear: External ear normal.     Nose: Nose normal.     Mouth/Throat:     Mouth: Mucous membranes are moist.  Eyes:     General: No scleral icterus.       Right eye: No discharge.        Left eye: No discharge.  Cardiovascular:     Rate and Rhythm: Normal rate. Rhythm irregular.     Pulses: Normal pulses.     Heart sounds: Normal heart sounds.  Pulmonary:     Effort: Pulmonary effort is normal. No respiratory distress.     Breath sounds: Normal breath sounds. No stridor.  Abdominal:     General: Abdomen is flat. There is no distension.     Palpations:  Abdomen is soft.     Tenderness: There is no abdominal tenderness.  Musculoskeletal:     Cervical back: No rigidity.     Right lower leg: No edema.     Left lower leg: No edema.  Skin:    General: Skin is warm and dry.     Capillary Refill: Capillary refill takes less than 2 seconds.  Neurological:     Mental Status: Kelly Boone is alert and oriented to person, place, and time.  Psychiatric:        Mood and Affect: Mood normal.        Behavior: Behavior normal. Behavior is cooperative.     ED Results and Treatments Labs (all labs ordered are listed, but only abnormal results are displayed) Labs Reviewed  BASIC METABOLIC PANEL WITH GFR - Abnormal; Notable for the following components:      Result Value   Glucose, Bld 100 (*)    Calcium 8.6 (*)    All other components within normal limits  CBC WITH DIFFERENTIAL/PLATELET - Abnormal; Notable for the following components:   Platelets 479 (*)    All other components within normal limits  BRAIN NATRIURETIC PEPTIDE - Abnormal;  Notable for the following components:   B Natriuretic Peptide 320.0 (*)    All other components within normal limits  RESP PANEL BY RT-PCR (RSV, FLU A&B, COVID)  RVPGX2  TROPONIN I (HIGH SENSITIVITY)  TROPONIN I (HIGH SENSITIVITY)                                                                                                                          Radiology CT Angio Chest PE W and/or Wo Contrast Result Date: 08/21/2023 CLINICAL DATA:  Pulmonary embolism (PE) suspected, high prob EXAM: CT ANGIOGRAPHY CHEST WITH CONTRAST TECHNIQUE: Multidetector CT imaging of the chest was performed using the standard protocol during bolus administration of intravenous contrast. Multiplanar CT image reconstructions and MIPs were obtained to evaluate the vascular anatomy. RADIATION DOSE REDUCTION: This exam was performed according to the departmental dose-optimization program which includes automated exposure control, adjustment of the mA  and/or kV according to patient size and/or use of iterative reconstruction technique. CONTRAST:  65mL OMNIPAQUE IOHEXOL 350 MG/ML SOLN COMPARISON:  None Available. FINDINGS: Cardiovascular: No evidence of embolism to the proximal subsegmental pulmonary artery level. Mild cardiomegaly. No pericardial effusion. No aortic aneurysm. There are coronary artery calcifications, in keeping with coronary artery disease. There are also moderate peripheral atherosclerotic vascular calcifications of thoracic aorta and its major branches. Mediastinum/Nodes: Visualized thyroid  gland appears grossly unremarkable. No solid / cystic mediastinal masses. The esophagus is nondistended precluding optimal assessment. No axillary, mediastinal or hilar lymphadenopathy by size criteria. Lungs/Pleura: The central tracheo-bronchial tree is patent. There are patchy areas of linear, plate-like atelectasis and/or scarring throughout bilateral lungs. No mass or consolidation. No pleural effusion or pneumothorax. There is a 3 mm subpleural noncalcified nodule in the left lung lower lobe (series 7, image 83). Please see follow-up recommendations below. Upper Abdomen: Surgically absent gallbladder. Visualized upper abdominal viscera within normal limits. Musculoskeletal: The visualized soft tissues of the chest wall are grossly unremarkable. No suspicious osseous lesions. There are mild to moderate multilevel degenerative changes in the visualized spine. There is a 9 x 11 mm calcified structure in the central spinal canal at L1 vertebral body level, suggesting within the spinal cord. This is new since the prior study from 12/08/2014; however, present since the prior lumbar spine CT scan from 02/16/2022 and there is no significant interval change. Findings are again favored to represent a calcified meningioma. MRI with and without contrast is again recommended, unless previously performed. Review of the MIP images confirms the above findings.  IMPRESSION: 1. No embolism to the proximal subsegmental pulmonary artery level. 2. No lung mass, consolidation, pleural effusion or pneumothorax. 3. There is a 3 mm subpleural noncalcified nodule in the left lung lower lobe. Please see follow-up recommendations below. 4. Multiple other nonacute observations, as described above. Aortic Atherosclerosis (ICD10-I70.0). Pulmonary nodule follow-up recommendation: Solid lung nodule measuring up to 6 mm: - LOW RISK: No routine follow-up. - HIGHER RISK: Optional CT at 12 months. Recommendations for evaluation of incidental  nodules derived from guidelines developed by the Fleischner Society ( Radiology 2017; [id number]). These guidelines apply to incidental nodules, which can be managed according to the specific recommendations. These guidelines do not apply to patient's younger than 35 years, immunocompromised patients, or patients with cancer. For lung cancer screening, adherence to the existing [employer] of radiology lung CT Screening Reporting and Data System (lung-RADS) guidelines is recommended. High risk factors include older age, heavy smoking, larger nodule size, irregular or spiculated margins and upper lobe location. Clinical risk factors include smoking, exposure to other carcinogens, emphysema, fibrosis, upper lobe location, family history of lung cancer, age and sex. Electronically Signed   By: Beula Brunswick M.D.   On: 08/21/2023 12:22   DG Chest Port 1 View Result Date: 08/21/2023 CLINICAL DATA:  Chest pain EXAM: PORTABLE CHEST 1 VIEW COMPARISON:  Prior chest x-ray 10/08/2014 FINDINGS: Cardiac and mediastinal contours are within normal limits. Atherosclerotic calcifications present in the transverse aorta. No pulmonary edema, pleural effusion or pneumothorax. No focal airspace infiltrate. Degenerative changes at both shoulder joints consistent with arthritis worse on the left than the right. IMPRESSION: No active disease. Electronically Signed   By:  Fernando Hoyer M.D.   On: 08/21/2023 09:10    Pertinent labs & imaging results that were available during my care of the patient were reviewed by me and considered in my medical decision making (see MDM for details).  Medications Ordered in ED Medications  iohexol (OMNIPAQUE) 350 MG/ML injection 65 mL (65 mLs Intravenous Contrast Given 08/21/23 1158)                                                                                                                                     Procedures Procedures  (including critical care time)  Medical Decision Making / ED Course    Medical Decision Making:    KAVITHA LANSDALE is a 89 y.o. female with past medical history as below, significant for paroxysmal atrial fibrillation on Eliquis , CKD, restless legs, dementia who presents to the ED with complaint of chest pain. The complaint involves an extensive differential diagnosis and also carries with it a high risk of complications and morbidity.  Serious etiology was considered. Ddx includes but is not limited to: Differential includes all life-threatening causes for chest pain. This includes but is not exclusive to acute coronary syndrome, aortic dissection, pulmonary embolism, cardiac tamponade, community-acquired pneumonia, pericarditis, musculoskeletal chest wall pain, etc.   Complete initial physical exam performed, notably the patient was in no distress, no hypoxia, A-fib rate controlled on telemetry.    Reviewed and confirmed nursing documentation for past medical history, family history, social history.  Vital signs reviewed.     Brief summary:  89 year old female history as below including A-fib on Eliquis , dementia, CKD here with intermittent chest pain   Clinical Course as of 08/21/23 1305  Tue Aug 21, 2023  1227 CT stable, no PE.  Nodules noted incidentally  [SG]  1237 Feeling better [SG]    Clinical Course User Index [SG] Teddi Favors, DO     Labs reviewed, BNP is mildly  elevated, no evidence of volume overload on exam.  No pulmonary edema.  No dyspnea.  Kelly Boone has incidental findings noted on her CT imaging that discussed with the patient and family bedside.  Her symptoms have resolved.  HEART Score is a 3  Given her age and co-morbidities would recommend Kelly Boone follow up with her cardiologist in the office  The patient's chest pain is not suggestive of pulmonary embolus, cardiac ischemia, aortic dissection, pericarditis, myocarditis, pulmonary embolism, pneumothorax, pneumonia, Zoster, or esophageal perforation, or other serious etiology.  Historically not abrupt in onset, tearing or ripping, pulses symmetric. EKG nonspecific for ischemia/infarction. No dysrhythmias, brugada, WPW, prolonged QT noted.   Troponin negative x2. CXR reviewed. Labs without demonstration of acute pathology unless otherwise noted above. Low HEART Score: 0-3 points (0.9-1.7% risk of MACE).]   Patient in no distress and overall condition improved here in the ED. Detailed discussions were had with the patient/guardian regarding current findings, and need for close f/u with PCP or on call doctor. The patient/guardian has been instructed to return immediately if the symptoms worsen in any way for re-evaluation. Patient/guardian verbalized understanding and is in agreement with current care plan. All questions answered prior to discharge.              Additional history obtained: -Additional history obtained from family -External records from outside source obtained and reviewed including: Chart review including previous notes, labs, imaging, consultation notes including  Prior neurology documentation, home medications, primary care documentation   Lab Tests: -I ordered, reviewed, and interpreted labs.   The pertinent results include:   Labs Reviewed  BASIC METABOLIC PANEL WITH GFR - Abnormal; Notable for the following components:      Result Value   Glucose, Bld 100 (*)     Calcium 8.6 (*)    All other components within normal limits  CBC WITH DIFFERENTIAL/PLATELET - Abnormal; Notable for the following components:   Platelets 479 (*)    All other components within normal limits  BRAIN NATRIURETIC PEPTIDE - Abnormal; Notable for the following components:   B Natriuretic Peptide 320.0 (*)    All other components within normal limits  RESP PANEL BY RT-PCR (RSV, FLU A&B, COVID)  RVPGX2  TROPONIN I (HIGH SENSITIVITY)  TROPONIN I (HIGH SENSITIVITY)    Notable for bnp elev  EKG   EKG Interpretation Date/Time:  Tuesday Aug 21 2023 08:08:54 EDT Ventricular Rate:  82 PR Interval:    QRS Duration:  87 QT Interval:  387 QTC Calculation: 430 R Axis:   85  Text Interpretation: Atrial fibrillation Ventricular premature complex Anterior infarct, old similar prior Confirmed by Russella Courts ([id number]) on 08/21/2023 9:27:14 AM         Imaging Studies ordered: I ordered imaging studies including cxr, ctpe I independently visualized the following imaging with scope of interpretation limited to determining acute life threatening conditions related to emergency care; findings noted above I agree with the radiologist interpretation If any imaging was obtained with contrast I closely monitored patient for any possible adverse reaction a/w contrast administration in the emergency department   Medicines ordered and prescription drug management: Meds ordered this encounter  Medications   iohexol (OMNIPAQUE) 350 MG/ML injection 65 mL    -I have reviewed the patients home medicines and have made adjustments as  needed   Consultations Obtained: na   Cardiac Monitoring: The patient was maintained on a cardiac monitor.  I personally viewed and interpreted the cardiac monitored which showed an underlying rhythm of: afib rate controlled Continuous pulse oximetry interpreted by myself, 99% on RA.    Social Determinants of Health:  Diagnosis or treatment significantly  limited by social determinants of health: dementia   Reevaluation: After the interventions noted above, I reevaluated the patient and found that they have resolved  Co morbidities that complicate the patient evaluation  Past Medical History:  Diagnosis Date   A-fib (HCC)    irregular heartbeat   Allergic rhinitis    Anxiety    Arthritis    Asthma    Atopic dermatitis    Benign essential hypertension    Chronic kidney disease    hx of kidney stones as teenager   Depression    Disorders of magnesium metabolism    DM (diabetes mellitus) (HCC) 2004   Edema    Encounter for long-term (current) use of other medications    Esophagitis    GERD (gastroesophageal reflux disease)    Headache    Hearing loss    Hypercalcemia    Hyperlipemia 1997   Hypertension 1997   Hyposmolality and/or hyponatremia    Myalgia and myositis, unspecified    Myocardial infarction [facility])    June 2015, was told caused by stress   Need for prophylactic vaccination and inoculation against influenza    Osteoporosis, unspecified    Other screening mammogram    Other syndromes affecting cervical region    Pain in joint, lower leg    Pain in joint, shoulder region    Peripheral autonomic neuropathy in disorders classified elsewhere(337.1)    Pneumonia    as infant   Restless legs syndrome (RLS)    Screening for depression    Sinus bradycardia    Special screening for malignant neoplasms, colon    Type 2 diabetes mellitus with autonomic neuropathy (HCC)    Unspecified essential hypertension    Unspecified vitamin D deficiency    Urinary tract infection, site not specified    Varicose veins of lower extremities with inflammation       Dispostion: Disposition decision including need for hospitalization was considered, and patient discharged from emergency department.    Final Clinical Impression(s) / ED Diagnoses Final diagnoses:  Lung nodule  Spinal cord lesion (HCC)        Teddi Favors, DO 08/21/23 1305

## 2023-08-21 NOTE — ED Triage Notes (Signed)
 Patient with hx of dementia from home via EMS for eval of chest pain that started at 0630 today. Family also reports tremoring this morning with hx of same so they gave her 5mg  Valium , after which she became somewhat lethargic. Family gave 324 ASA before EMS arrival.

## 2023-08-21 NOTE — ED Notes (Signed)
 Patient transported to CT

## 2023-08-21 NOTE — ED Notes (Signed)
 Spoke with Tara in lab and she will add on the BNP

## 2023-10-02 DIAGNOSIS — J45909 Unspecified asthma, uncomplicated: Secondary | ICD-10-CM | POA: Diagnosis not present

## 2023-10-02 DIAGNOSIS — M810 Age-related osteoporosis without current pathological fracture: Secondary | ICD-10-CM | POA: Diagnosis not present

## 2023-10-02 DIAGNOSIS — F03B0 Unspecified dementia, moderate, without behavioral disturbance, psychotic disturbance, mood disturbance, and anxiety: Secondary | ICD-10-CM | POA: Diagnosis not present

## 2023-10-02 DIAGNOSIS — M1990 Unspecified osteoarthritis, unspecified site: Secondary | ICD-10-CM | POA: Diagnosis not present

## 2023-10-02 DIAGNOSIS — I10 Essential (primary) hypertension: Secondary | ICD-10-CM | POA: Diagnosis not present

## 2023-10-02 DIAGNOSIS — Z79899 Other long term (current) drug therapy: Secondary | ICD-10-CM | POA: Diagnosis not present

## 2023-10-02 DIAGNOSIS — E119 Type 2 diabetes mellitus without complications: Secondary | ICD-10-CM | POA: Diagnosis not present

## 2023-10-02 DIAGNOSIS — F3342 Major depressive disorder, recurrent, in full remission: Secondary | ICD-10-CM | POA: Diagnosis not present

## 2023-10-02 DIAGNOSIS — G2581 Restless legs syndrome: Secondary | ICD-10-CM | POA: Diagnosis not present

## 2023-10-02 DIAGNOSIS — I480 Paroxysmal atrial fibrillation: Secondary | ICD-10-CM | POA: Diagnosis not present

## 2023-10-02 DIAGNOSIS — R790 Abnormal level of blood mineral: Secondary | ICD-10-CM | POA: Diagnosis not present

## 2023-10-02 DIAGNOSIS — Z7982 Long term (current) use of aspirin: Secondary | ICD-10-CM | POA: Diagnosis not present

## 2023-10-09 DIAGNOSIS — E875 Hyperkalemia: Secondary | ICD-10-CM | POA: Diagnosis not present

## 2023-11-10 DIAGNOSIS — R0789 Other chest pain: Secondary | ICD-10-CM | POA: Diagnosis not present

## 2023-11-10 DIAGNOSIS — I081 Rheumatic disorders of both mitral and tricuspid valves: Secondary | ICD-10-CM | POA: Diagnosis not present

## 2023-11-10 DIAGNOSIS — I482 Chronic atrial fibrillation, unspecified: Secondary | ICD-10-CM | POA: Diagnosis not present

## 2023-11-10 DIAGNOSIS — Z79899 Other long term (current) drug therapy: Secondary | ICD-10-CM | POA: Diagnosis not present

## 2023-11-10 DIAGNOSIS — R079 Chest pain, unspecified: Secondary | ICD-10-CM | POA: Diagnosis not present

## 2023-11-10 DIAGNOSIS — R7989 Other specified abnormal findings of blood chemistry: Secondary | ICD-10-CM | POA: Diagnosis not present

## 2023-11-10 DIAGNOSIS — R519 Headache, unspecified: Secondary | ICD-10-CM | POA: Diagnosis not present

## 2023-11-10 DIAGNOSIS — I4891 Unspecified atrial fibrillation: Secondary | ICD-10-CM | POA: Diagnosis not present

## 2023-11-10 DIAGNOSIS — I252 Old myocardial infarction: Secondary | ICD-10-CM | POA: Diagnosis not present

## 2023-11-10 DIAGNOSIS — Z66 Do not resuscitate: Secondary | ICD-10-CM | POA: Diagnosis not present

## 2023-11-10 DIAGNOSIS — I110 Hypertensive heart disease with heart failure: Secondary | ICD-10-CM | POA: Diagnosis not present

## 2023-11-10 DIAGNOSIS — R9431 Abnormal electrocardiogram [ECG] [EKG]: Secondary | ICD-10-CM | POA: Diagnosis not present

## 2023-11-10 DIAGNOSIS — Z7901 Long term (current) use of anticoagulants: Secondary | ICD-10-CM | POA: Diagnosis not present

## 2023-11-10 DIAGNOSIS — Z888 Allergy status to other drugs, medicaments and biological substances status: Secondary | ICD-10-CM | POA: Diagnosis not present

## 2023-11-10 DIAGNOSIS — I5032 Chronic diastolic (congestive) heart failure: Secondary | ICD-10-CM | POA: Diagnosis not present

## 2023-11-10 DIAGNOSIS — Z87440 Personal history of urinary (tract) infections: Secondary | ICD-10-CM | POA: Diagnosis not present

## 2023-11-10 DIAGNOSIS — F0390 Unspecified dementia without behavioral disturbance: Secondary | ICD-10-CM | POA: Diagnosis not present

## 2023-11-10 DIAGNOSIS — F419 Anxiety disorder, unspecified: Secondary | ICD-10-CM | POA: Diagnosis not present

## 2023-11-10 DIAGNOSIS — M1990 Unspecified osteoarthritis, unspecified site: Secondary | ICD-10-CM | POA: Diagnosis not present

## 2023-11-10 DIAGNOSIS — J45909 Unspecified asthma, uncomplicated: Secondary | ICD-10-CM | POA: Diagnosis not present

## 2023-11-10 DIAGNOSIS — G2581 Restless legs syndrome: Secondary | ICD-10-CM | POA: Diagnosis not present

## 2023-11-10 DIAGNOSIS — Z7982 Long term (current) use of aspirin: Secondary | ICD-10-CM | POA: Diagnosis not present

## 2023-11-11 DIAGNOSIS — R0789 Other chest pain: Secondary | ICD-10-CM | POA: Diagnosis not present

## 2023-11-11 DIAGNOSIS — I517 Cardiomegaly: Secondary | ICD-10-CM | POA: Diagnosis not present

## 2023-11-11 DIAGNOSIS — I340 Nonrheumatic mitral (valve) insufficiency: Secondary | ICD-10-CM | POA: Diagnosis not present

## 2023-11-11 DIAGNOSIS — I482 Chronic atrial fibrillation, unspecified: Secondary | ICD-10-CM | POA: Diagnosis not present

## 2023-11-11 DIAGNOSIS — Z7901 Long term (current) use of anticoagulants: Secondary | ICD-10-CM | POA: Diagnosis not present

## 2023-11-11 DIAGNOSIS — I10 Essential (primary) hypertension: Secondary | ICD-10-CM | POA: Diagnosis not present

## 2023-11-11 DIAGNOSIS — I4891 Unspecified atrial fibrillation: Secondary | ICD-10-CM | POA: Diagnosis not present

## 2023-11-11 DIAGNOSIS — R9431 Abnormal electrocardiogram [ECG] [EKG]: Secondary | ICD-10-CM | POA: Diagnosis not present

## 2023-11-11 DIAGNOSIS — R079 Chest pain, unspecified: Secondary | ICD-10-CM | POA: Diagnosis not present

## 2023-11-12 DIAGNOSIS — R079 Chest pain, unspecified: Secondary | ICD-10-CM | POA: Diagnosis not present

## 2023-11-12 DIAGNOSIS — Z7901 Long term (current) use of anticoagulants: Secondary | ICD-10-CM | POA: Diagnosis not present

## 2023-11-12 DIAGNOSIS — I10 Essential (primary) hypertension: Secondary | ICD-10-CM | POA: Diagnosis not present

## 2023-11-12 DIAGNOSIS — I482 Chronic atrial fibrillation, unspecified: Secondary | ICD-10-CM | POA: Diagnosis not present

## 2023-11-12 DIAGNOSIS — R0789 Other chest pain: Secondary | ICD-10-CM | POA: Diagnosis not present

## 2023-11-22 ENCOUNTER — Encounter: Payer: Self-pay | Admitting: Internal Medicine

## 2024-01-02 DIAGNOSIS — Z79899 Other long term (current) drug therapy: Secondary | ICD-10-CM | POA: Diagnosis not present

## 2024-01-02 DIAGNOSIS — F03B0 Unspecified dementia, moderate, without behavioral disturbance, psychotic disturbance, mood disturbance, and anxiety: Secondary | ICD-10-CM | POA: Diagnosis not present

## 2024-01-02 DIAGNOSIS — I10 Essential (primary) hypertension: Secondary | ICD-10-CM | POA: Diagnosis not present

## 2024-01-02 DIAGNOSIS — I959 Hypotension, unspecified: Secondary | ICD-10-CM | POA: Diagnosis not present

## 2024-01-02 DIAGNOSIS — Z23 Encounter for immunization: Secondary | ICD-10-CM | POA: Diagnosis not present

## 2024-01-02 DIAGNOSIS — I2089 Other forms of angina pectoris: Secondary | ICD-10-CM | POA: Diagnosis not present

## 2024-01-18 DIAGNOSIS — M25561 Pain in right knee: Secondary | ICD-10-CM | POA: Diagnosis not present

## 2024-01-18 DIAGNOSIS — M25562 Pain in left knee: Secondary | ICD-10-CM | POA: Diagnosis not present

## 2024-01-20 DIAGNOSIS — I480 Paroxysmal atrial fibrillation: Secondary | ICD-10-CM | POA: Diagnosis not present

## 2024-01-20 DIAGNOSIS — I25119 Atherosclerotic heart disease of native coronary artery with unspecified angina pectoris: Secondary | ICD-10-CM | POA: Diagnosis not present

## 2024-01-20 DIAGNOSIS — R0600 Dyspnea, unspecified: Secondary | ICD-10-CM | POA: Diagnosis not present

## 2024-01-20 DIAGNOSIS — I110 Hypertensive heart disease with heart failure: Secondary | ICD-10-CM | POA: Diagnosis present

## 2024-01-20 DIAGNOSIS — Z66 Do not resuscitate: Secondary | ICD-10-CM | POA: Diagnosis present

## 2024-01-20 DIAGNOSIS — J159 Unspecified bacterial pneumonia: Secondary | ICD-10-CM | POA: Diagnosis present

## 2024-01-20 DIAGNOSIS — I4891 Unspecified atrial fibrillation: Secondary | ICD-10-CM | POA: Diagnosis not present

## 2024-01-20 DIAGNOSIS — Z888 Allergy status to other drugs, medicaments and biological substances status: Secondary | ICD-10-CM | POA: Diagnosis not present

## 2024-01-20 DIAGNOSIS — R0602 Shortness of breath: Secondary | ICD-10-CM | POA: Diagnosis not present

## 2024-01-20 DIAGNOSIS — Z792 Long term (current) use of antibiotics: Secondary | ICD-10-CM | POA: Diagnosis not present

## 2024-01-20 DIAGNOSIS — R410 Disorientation, unspecified: Secondary | ICD-10-CM | POA: Diagnosis not present

## 2024-01-20 DIAGNOSIS — R7989 Other specified abnormal findings of blood chemistry: Secondary | ICD-10-CM | POA: Diagnosis not present

## 2024-01-20 DIAGNOSIS — F03A4 Unspecified dementia, mild, with anxiety: Secondary | ICD-10-CM | POA: Diagnosis present

## 2024-01-20 DIAGNOSIS — R9431 Abnormal electrocardiogram [ECG] [EKG]: Secondary | ICD-10-CM | POA: Diagnosis not present

## 2024-01-20 DIAGNOSIS — I252 Old myocardial infarction: Secondary | ICD-10-CM | POA: Diagnosis not present

## 2024-01-20 DIAGNOSIS — R069 Unspecified abnormalities of breathing: Secondary | ICD-10-CM | POA: Diagnosis not present

## 2024-01-20 DIAGNOSIS — I509 Heart failure, unspecified: Secondary | ICD-10-CM | POA: Diagnosis not present

## 2024-01-20 DIAGNOSIS — G2581 Restless legs syndrome: Secondary | ICD-10-CM | POA: Diagnosis present

## 2024-01-20 DIAGNOSIS — I495 Sick sinus syndrome: Secondary | ICD-10-CM | POA: Diagnosis not present

## 2024-01-20 DIAGNOSIS — Z87440 Personal history of urinary (tract) infections: Secondary | ICD-10-CM | POA: Diagnosis not present

## 2024-01-20 DIAGNOSIS — I510 Cardiac septal defect, acquired: Secondary | ICD-10-CM | POA: Diagnosis not present

## 2024-01-20 DIAGNOSIS — Z7401 Bed confinement status: Secondary | ICD-10-CM | POA: Diagnosis not present

## 2024-01-20 DIAGNOSIS — B348 Other viral infections of unspecified site: Secondary | ICD-10-CM | POA: Diagnosis present

## 2024-01-20 DIAGNOSIS — R918 Other nonspecific abnormal finding of lung field: Secondary | ICD-10-CM | POA: Diagnosis not present

## 2024-01-20 DIAGNOSIS — E875 Hyperkalemia: Secondary | ICD-10-CM | POA: Diagnosis present

## 2024-01-20 DIAGNOSIS — J9601 Acute respiratory failure with hypoxia: Secondary | ICD-10-CM | POA: Diagnosis not present

## 2024-01-20 DIAGNOSIS — K219 Gastro-esophageal reflux disease without esophagitis: Secondary | ICD-10-CM | POA: Diagnosis present

## 2024-01-20 DIAGNOSIS — I4811 Longstanding persistent atrial fibrillation: Secondary | ICD-10-CM | POA: Diagnosis not present

## 2024-01-20 DIAGNOSIS — I5033 Acute on chronic diastolic (congestive) heart failure: Secondary | ICD-10-CM | POA: Diagnosis present

## 2024-01-20 DIAGNOSIS — M1990 Unspecified osteoarthritis, unspecified site: Secondary | ICD-10-CM | POA: Diagnosis present

## 2024-01-20 DIAGNOSIS — J45909 Unspecified asthma, uncomplicated: Secondary | ICD-10-CM | POA: Diagnosis present

## 2024-01-20 DIAGNOSIS — E119 Type 2 diabetes mellitus without complications: Secondary | ICD-10-CM | POA: Diagnosis present

## 2024-01-20 DIAGNOSIS — Z79899 Other long term (current) drug therapy: Secondary | ICD-10-CM | POA: Diagnosis not present

## 2024-01-20 DIAGNOSIS — G309 Alzheimer's disease, unspecified: Secondary | ICD-10-CM | POA: Diagnosis not present

## 2024-01-20 DIAGNOSIS — I499 Cardiac arrhythmia, unspecified: Secondary | ICD-10-CM | POA: Diagnosis not present

## 2024-01-20 DIAGNOSIS — A419 Sepsis, unspecified organism: Secondary | ICD-10-CM | POA: Diagnosis present

## 2024-01-20 DIAGNOSIS — D75839 Thrombocytosis, unspecified: Secondary | ICD-10-CM | POA: Diagnosis present

## 2024-01-21 DIAGNOSIS — I480 Paroxysmal atrial fibrillation: Secondary | ICD-10-CM | POA: Diagnosis not present

## 2024-01-21 DIAGNOSIS — R7989 Other specified abnormal findings of blood chemistry: Secondary | ICD-10-CM | POA: Diagnosis not present

## 2024-01-21 DIAGNOSIS — I495 Sick sinus syndrome: Secondary | ICD-10-CM | POA: Diagnosis not present

## 2024-01-22 DIAGNOSIS — I495 Sick sinus syndrome: Secondary | ICD-10-CM | POA: Diagnosis not present

## 2024-01-22 DIAGNOSIS — I480 Paroxysmal atrial fibrillation: Secondary | ICD-10-CM | POA: Diagnosis not present

## 2024-01-22 DIAGNOSIS — R7989 Other specified abnormal findings of blood chemistry: Secondary | ICD-10-CM | POA: Diagnosis not present

## 2024-05-05 ENCOUNTER — Ambulatory Visit: Payer: Medicare Other | Admitting: Neurology

## 2024-09-02 ENCOUNTER — Ambulatory Visit: Admitting: Neurology
